# Patient Record
Sex: Female | Born: 1955 | Race: White | Hispanic: No | Marital: Single | Smoking: Current every day smoker
Health system: Southern US, Community
[De-identification: ages and names within clinical notes are randomized; demographics above are authoritative.]

## PROBLEM LIST (undated history)

## (undated) DIAGNOSIS — Z9889 Other specified postprocedural states: Secondary | ICD-10-CM

## (undated) DIAGNOSIS — E7800 Pure hypercholesterolemia, unspecified: Secondary | ICD-10-CM

## (undated) DIAGNOSIS — IMO0001 Reserved for inherently not codable concepts without codable children: Secondary | ICD-10-CM

## (undated) DIAGNOSIS — M1990 Unspecified osteoarthritis, unspecified site: Secondary | ICD-10-CM

## (undated) DIAGNOSIS — I10 Essential (primary) hypertension: Secondary | ICD-10-CM

## (undated) DIAGNOSIS — G5602 Carpal tunnel syndrome, left upper limb: Secondary | ICD-10-CM

## (undated) DIAGNOSIS — F32A Depression, unspecified: Secondary | ICD-10-CM

## (undated) DIAGNOSIS — Z8679 Personal history of other diseases of the circulatory system: Secondary | ICD-10-CM

## (undated) DIAGNOSIS — J449 Chronic obstructive pulmonary disease, unspecified: Secondary | ICD-10-CM

## (undated) DIAGNOSIS — G8929 Other chronic pain: Secondary | ICD-10-CM

## (undated) DIAGNOSIS — Z794 Long term (current) use of insulin: Secondary | ICD-10-CM

## (undated) DIAGNOSIS — E119 Type 2 diabetes mellitus without complications: Secondary | ICD-10-CM

## (undated) DIAGNOSIS — F329 Major depressive disorder, single episode, unspecified: Secondary | ICD-10-CM

## (undated) DIAGNOSIS — K219 Gastro-esophageal reflux disease without esophagitis: Secondary | ICD-10-CM

## (undated) HISTORY — PX: TUBAL LIGATION: SHX77

## (undated) HISTORY — PX: UMBILICAL HERNIA REPAIR: SHX196

---

## 2001-06-20 ENCOUNTER — Emergency Department (HOSPITAL_COMMUNITY): Admission: EM | Admit: 2001-06-20 | Discharge: 2001-06-20 | Payer: Self-pay | Admitting: *Deleted

## 2001-12-06 ENCOUNTER — Other Ambulatory Visit: Admission: RE | Admit: 2001-12-06 | Discharge: 2001-12-06 | Payer: Self-pay | Admitting: General Surgery

## 2007-05-14 ENCOUNTER — Encounter (INDEPENDENT_AMBULATORY_CARE_PROVIDER_SITE_OTHER): Payer: Self-pay | Admitting: General Surgery

## 2007-05-14 ENCOUNTER — Observation Stay (HOSPITAL_COMMUNITY): Admission: RE | Admit: 2007-05-14 | Discharge: 2007-05-15 | Payer: Self-pay | Admitting: General Surgery

## 2007-07-29 ENCOUNTER — Emergency Department (HOSPITAL_COMMUNITY): Admission: EM | Admit: 2007-07-29 | Discharge: 2007-07-29 | Payer: Self-pay | Admitting: Emergency Medicine

## 2007-08-01 ENCOUNTER — Emergency Department (HOSPITAL_COMMUNITY): Admission: EM | Admit: 2007-08-01 | Discharge: 2007-08-02 | Payer: Self-pay | Admitting: Emergency Medicine

## 2008-06-08 ENCOUNTER — Emergency Department (HOSPITAL_COMMUNITY): Admission: EM | Admit: 2008-06-08 | Discharge: 2008-06-08 | Payer: Self-pay | Admitting: Emergency Medicine

## 2009-02-16 ENCOUNTER — Emergency Department (HOSPITAL_COMMUNITY): Admission: EM | Admit: 2009-02-16 | Discharge: 2009-02-16 | Payer: Self-pay | Admitting: Emergency Medicine

## 2009-02-19 ENCOUNTER — Ambulatory Visit (HOSPITAL_COMMUNITY): Admission: RE | Admit: 2009-02-19 | Discharge: 2009-02-19 | Payer: Self-pay | Admitting: Family Medicine

## 2009-02-27 ENCOUNTER — Encounter: Admission: RE | Admit: 2009-02-27 | Discharge: 2009-02-27 | Payer: Self-pay | Admitting: Family Medicine

## 2009-03-11 ENCOUNTER — Emergency Department (HOSPITAL_COMMUNITY): Admission: EM | Admit: 2009-03-11 | Discharge: 2009-03-11 | Payer: Self-pay | Admitting: Emergency Medicine

## 2009-03-12 ENCOUNTER — Emergency Department (HOSPITAL_COMMUNITY): Admission: EM | Admit: 2009-03-12 | Discharge: 2009-03-12 | Payer: Self-pay | Admitting: Emergency Medicine

## 2010-11-05 NOTE — Op Note (Signed)
[name], [name]               ACCOUNT NO.:  [id number]   MEDICAL RECORD NO.:  [id number]          PATIENT TYPE:  AMB   LOCATION:  DAY                           FACILITY:  [facility]   PHYSICIAN:  [name], M.D. DATE OF BIRTH:  [birth year]   DATE OF PROCEDURE:  05/14/2007  DATE OF DISCHARGE:                               OPERATIVE REPORT   SURGEON:  Dr. Malvin Johns.   PREOPERATIVE DIAGNOSIS:  Umbilical hernia.   POSTOPERATIVE DIAGNOSIS:  Umbilical hernia.   PROCEDURE:  Umbilical herniorrhaphy (no use of mesh).   SPECIMEN:  Umbilical hernia sac with omental fat.   HISTORY OF PRESENT ILLNESS:  This is a 55 year old, obese, white female  who had periumbilical pain.  She was straining last week or so and  developed pain that was more or less of an acute nature of approximately  a week's duration.  She was sent to my office for evaluation.  In  essence, she had had an umbilical hernia and we had made plans for  repair.  We discussed complications not limited to but including  bleeding, infection and recurrence and the possible use of mesh.  Informed consent was obtained.   GROSS OPERATIVE FINDINGS:  The patient had omental fat herniated through  approximately a dime sized hernia defect.  There was no bowel or any  compromised intraperitoneal organs.   TECHNIQUE:  The patient was placed in supine position after the adequate  administration of general anesthesia. A periumbilical incision was  carried out over the inferior aspect of the umbilicus skin and  subcutaneous tissue was excised down to the fascia, the hernia sac was  dissected free from this skin of the umbilicus in order to avoid any  buttonholing of the skin.  The redundant portion of the hernia sac was  then entered, the omental fat was clamped and ligated with 2-0 silk and  sent along a specimen along with the hernia sac.  The defect was then  repaired with figure-of-eight #0 Prolene sutures.  After checking for  any  bleeding, the wound was then irrigated with normal saline solution.  I used approximately 20 mL of 1/2% Sensorcaine to help with  postoperative discomfort.  I sutured the portion of the umbilicus to the  fascia in order to restore the concave appearance of the umbilicus and  then closed the subcutaneous tissue with 3-0 Polysorb after irrigating  with normal saline solution.  The surgical staples were used to close  the skin. Sterile dressing with Neosporin ointment was applied. Prior to  closure, all sponge, needle and  instrument counts were found to be correct.  Estimated blood loss was  minimal.  The patient received approximately a liter of crystalloids  intraoperatively.  There were no complication.  She was taken to the  recovery room in satisfactory condition.      [name], M.D.  Electronically Signed     WB/MEDQ  D:  05/14/2007  T:  05/15/2007  Job:  [id number]   cc:   Angus G. Renard Matter, MD  Fax: [phone]

## 2011-03-28 LAB — GLUCOSE, CAPILLARY: Glucose-Capillary: 124 mg/dL — ABNORMAL HIGH (ref 70–99)

## 2011-04-01 LAB — BASIC METABOLIC PANEL
BUN: 10
CO2: 27
Calcium: 9
Chloride: 107
Creatinine, Ser: 0.66
GFR calc Af Amer: >60
GFR calc non Af Amer: >60
Glucose, Bld: 105 — ABNORMAL HIGH
Potassium: 4.6
Sodium: 140

## 2011-04-01 LAB — CBC
HCT: 38.9
Hemoglobin: 13.4
MCHC: 34.3
MCV: 86.5
Platelets: 288
RBC: 4.5
RDW: 13.4
WBC: 7.5

## 2011-04-01 LAB — DIFFERENTIAL
Basophils Absolute: 0
Basophils Relative: 1
Eosinophils Absolute: 0.3
Eosinophils Relative: 3
Lymphocytes Relative: 32
Lymphs Abs: 2.4
Monocytes Absolute: 0.5
Monocytes Relative: 6
Neutro Abs: 4.3
Neutrophils Relative %: 58

## 2011-04-16 ENCOUNTER — Other Ambulatory Visit: Payer: Self-pay

## 2011-04-16 ENCOUNTER — Encounter: Payer: Self-pay | Admitting: *Deleted

## 2011-04-16 ENCOUNTER — Inpatient Hospital Stay (HOSPITAL_COMMUNITY)
Admission: EM | Admit: 2011-04-16 | Discharge: 2011-04-18 | DRG: 143 | Disposition: A | Discharge status: Home / Self Care | Payer: BC Managed Care – PPO | Attending: Family Medicine | Admitting: Family Medicine

## 2011-04-16 ENCOUNTER — Emergency Department (HOSPITAL_COMMUNITY): Payer: BC Managed Care – PPO

## 2011-04-16 DIAGNOSIS — E785 Hyperlipidemia, unspecified: Secondary | ICD-10-CM | POA: Diagnosis present

## 2011-04-16 DIAGNOSIS — R3989 Other symptoms and signs involving the genitourinary system: Secondary | ICD-10-CM | POA: Diagnosis present

## 2011-04-16 DIAGNOSIS — N19 Unspecified kidney failure: Secondary | ICD-10-CM

## 2011-04-16 DIAGNOSIS — F17200 Nicotine dependence, unspecified, uncomplicated: Secondary | ICD-10-CM | POA: Diagnosis present

## 2011-04-16 DIAGNOSIS — I10 Essential (primary) hypertension: Secondary | ICD-10-CM | POA: Diagnosis present

## 2011-04-16 DIAGNOSIS — R079 Chest pain, unspecified: Secondary | ICD-10-CM

## 2011-04-16 DIAGNOSIS — R0789 Other chest pain: Principal | ICD-10-CM | POA: Diagnosis present

## 2011-04-16 DIAGNOSIS — E119 Type 2 diabetes mellitus without complications: Secondary | ICD-10-CM | POA: Diagnosis present

## 2011-04-16 LAB — COMPREHENSIVE METABOLIC PANEL
ALT: 77 U/L — ABNORMAL HIGH (ref 0–35)
AST: 71 U/L — ABNORMAL HIGH (ref 0–37)
Albumin: 4.1 g/dL (ref 3.5–5.2)
Alkaline Phosphatase: 83 U/L (ref 39–117)
BUN: 27 mg/dL — ABNORMAL HIGH (ref 6–23)
CO2: 22 mEq/L (ref 19–32)
Calcium: 9.7 mg/dL (ref 8.4–10.5)
Chloride: 93 mEq/L — ABNORMAL LOW (ref 96–112)
Creatinine, Ser: 1.91 mg/dL — ABNORMAL HIGH (ref 0.50–1.10)
GFR calc Af Amer: 33 mL/min — ABNORMAL LOW (ref >90)
GFR calc non Af Amer: 29 mL/min — ABNORMAL LOW (ref >90)
Glucose, Bld: 192 mg/dL — ABNORMAL HIGH (ref 70–99)
Potassium: 4.9 mEq/L (ref 3.5–5.1)
Sodium: 132 mEq/L — ABNORMAL LOW (ref 135–145)
Total Bilirubin: 0.2 mg/dL — ABNORMAL LOW (ref 0.3–1.2)
Total Protein: 8.5 g/dL — ABNORMAL HIGH (ref 6.0–8.3)

## 2011-04-16 LAB — CBC
HCT: 43.3 % (ref 36.0–46.0)
Hemoglobin: 14.6 g/dL (ref 12.0–15.0)
MCH: 29.6 pg (ref 26.0–34.0)
MCHC: 33.7 g/dL (ref 30.0–36.0)
MCV: 87.8 fL (ref 78.0–100.0)
Platelets: 302 10*3/uL (ref 150–400)
RBC: 4.93 MIL/uL (ref 3.87–5.11)
RDW: 13.1 % (ref 11.5–15.5)
WBC: 8.1 10*3/uL (ref 4.0–10.5)

## 2011-04-16 LAB — CARDIAC PANEL(CRET KIN+CKTOT+MB+TROPI)
CK, MB: 2.9 ng/mL (ref 0.3–4.0)
Relative Index: 2.5 (ref 0.0–2.5)
Total CK: 115 U/L (ref 7–177)
Troponin I: <0.3 ng/mL (ref <0.30)

## 2011-04-16 LAB — CK TOTAL AND CKMB (NOT AT ARMC)
CK, MB: 2.7 ng/mL (ref 0.3–4.0)
Relative Index: 2.2 (ref 0.0–2.5)
Total CK: 125 U/L (ref 7–177)

## 2011-04-16 LAB — DIFFERENTIAL
Basophils Absolute: 0 10*3/uL (ref 0.0–0.1)
Basophils Relative: 1 % (ref 0–1)
Eosinophils Absolute: 0.1 10*3/uL (ref 0.0–0.7)
Eosinophils Relative: 1 % (ref 0–5)
Lymphocytes Relative: 21 % (ref 12–46)
Lymphs Abs: 1.7 10*3/uL (ref 0.7–4.0)
Monocytes Absolute: 0.6 10*3/uL (ref 0.1–1.0)
Monocytes Relative: 7 % (ref 3–12)
Neutro Abs: 5.7 10*3/uL (ref 1.7–7.7)
Neutrophils Relative %: 70 % (ref 43–77)

## 2011-04-16 LAB — GLUCOSE, CAPILLARY
Glucose-Capillary: 119 mg/dL — ABNORMAL HIGH (ref 70–99)
Glucose-Capillary: 141 mg/dL — ABNORMAL HIGH (ref 70–99)
Glucose-Capillary: 111 mg/dL — ABNORMAL HIGH (ref 70–99)

## 2011-04-16 LAB — POCT I-STAT TROPONIN I: Troponin i, poc: 0.01 ng/mL (ref 0.00–0.08)

## 2011-04-16 MED ORDER — SODIUM CHLORIDE 0.9 % IV SOLN
INTRAVENOUS | Status: DC
  Administered 2011-04-17 (×2): via INTRAVENOUS

## 2011-04-16 MED ORDER — ASPIRIN 81 MG PO CHEW
324.0000 mg | CHEWABLE_TABLET | Freq: Once | ORAL | Status: AC
  Administered 2011-04-16: 324 mg via ORAL
  Filled 2011-04-16: qty 4

## 2011-04-16 MED ORDER — SODIUM CHLORIDE 0.9 % IJ SOLN
INTRAMUSCULAR | Status: AC
  Filled 2011-04-16: qty 10

## 2011-04-16 MED ORDER — NITROGLYCERIN 0.4 MG SL SUBL: 0.4000 mg | SUBLINGUAL_TABLET | SUBLINGUAL | Status: DC | PRN

## 2011-04-16 MED ORDER — ROSUVASTATIN CALCIUM 20 MG PO TABS
20.0000 mg | ORAL_TABLET | Freq: Every day | ORAL | Status: DC
  Administered 2011-04-16 – 2011-04-18 (×3): 20 mg via ORAL
  Filled 2011-04-16 (×3): qty 1

## 2011-04-16 MED ORDER — MORPHINE SULFATE 4 MG/ML IJ SOLN
4.0000 mg | Freq: Once | INTRAMUSCULAR | Status: AC
  Administered 2011-04-16: 4 mg via INTRAVENOUS
  Filled 2011-04-16: qty 1

## 2011-04-16 MED ORDER — HYDROCODONE-ACETAMINOPHEN 5-325 MG PO TABS
1.0000 | ORAL_TABLET | ORAL | Status: DC | PRN
  Administered 2011-04-16: 1 via ORAL
  Filled 2011-04-16: qty 1

## 2011-04-16 MED ORDER — ASPIRIN 325 MG PO TABS
325.0000 mg | ORAL_TABLET | Freq: Every day | ORAL | Status: DC
  Administered 2011-04-16 – 2011-04-18 (×3): 325 mg via ORAL
  Filled 2011-04-16 (×3): qty 1

## 2011-04-16 MED ORDER — KETOROLAC TROMETHAMINE 30 MG/ML IJ SOLN
30.0000 mg | Freq: Once | INTRAMUSCULAR | Status: AC
  Administered 2011-04-16: 30 mg via INTRAVENOUS
  Filled 2011-04-16: qty 1

## 2011-04-16 MED ORDER — METFORMIN HCL 500 MG PO TABS
500.0000 mg | ORAL_TABLET | Freq: Two times a day (BID) | ORAL | Status: DC
  Administered 2011-04-16 – 2011-04-18 (×3): 500 mg via ORAL
  Filled 2011-04-16 (×3): qty 1

## 2011-04-16 MED ORDER — GI COCKTAIL ~~LOC~~
30.0000 mL | Freq: Once | ORAL | Status: AC
  Administered 2011-04-16: 30 mL via ORAL
  Filled 2011-04-16: qty 30

## 2011-04-16 NOTE — Consult Note (Signed)
Clinical Summary [name] is a 55 y.o.female admitted by Dr. Renard Matter with recent onset chest pain. We are consulted to assist with her evaluation.  She reports a recent history flu-like symptoms, specifically nausea with intermittent emesis, diarrhea, generalized achiness, and weakness. She states that she was treated with antibiotics by Dr. Renard Matter and was out of work until last night. 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 She had not experienced the symptoms in the week preceding.  She reports no personal history of CAD or myocardial infarction. She has not undergone any prior ischemic testing. Past medical history is reviewed below.  No Known Allergies  Home Medications Prescriptions prior to admission  Medication Sig Dispense Refill  . ALPRAZolam (XANAX) 0.5 MG tablet Take 0.5 mg by mouth every 4 (four) hours as needed. FOR ANXIETY       . cefdinir (OMNICEF) 300 MG capsule Take 300 mg by mouth 2 (two) times daily. STARTED ON 04-10-11 FOR 8 DAYS       . HYDROcodone-acetaminophen (LORTAB) 7.5-500 MG per tablet Take 1 tablet by mouth every 4 (four) hours as needed. FOR PAIN        . metFORMIN (GLUCOPHAGE) 500 MG tablet Take 500 mg by mouth every morning.        . rosuvastatin (CRESTOR) 10 MG tablet Take 10 mg by mouth daily. SAMPLES         Past Medical History  Diagnosis Date  . Type 2 diabetes mellitus   . Essential hypertension, benign   . Mixed hyperlipidemia     Past Surgical History  Procedure Date  . Umbilical hernia repair 2008  . Bilateral tubal ligation     Family History  Problem Relation Age of Onset  . Cancer Father   . COPD Mother     Social History Ms. Portell reports that she has been smoking Cigarettes.  She has been smoking about .5 packs per day. She has never used smokeless  tobacco. Ms. Mayden reports that she does not drink alcohol.  Review of Systems No palpitations or syncope. No claudication. No cough or hemarthrosis. Reports poor appetite this week, decreased urine output. Otherwise review negative.  Physical Examination Temp:  [97.6 F (36.4 C)-98.3 F (36.8 C)] 98.3 F (36.8 C) (10/24 1438) Pulse Rate:  [54-84] 62  (10/24 1438) Resp:  [13-21] 18  (10/24 1438) BP: (96-116)/(51-63) 101/63 mmHg (10/24 1438) SpO2:  [2 %-99 %] 92 % (10/24 1438) Weight:  [200 lb (90.719 kg)] 200 lb (90.719 kg) (10/24 0308)  Obese woman in no acute distress. HEENT: Conjunctiva and lids normal, oropharynx with poor dentition. Neck: Supple, no obvious JVD or carotid bruits, no thyromegaly. Lungs: Clear to auscultation, coarse breath sounds, no rales. Cardiac: Regular rate and rhythm with somewhat distant heart sounds, no significant murmur or S3 gallop. Abdomen: Nontender, nondistended, bowel sounds present. Skin: Warm and dry, scattered tattoos. Extremities: No pitting edema, distal pulses 1-2+. Musculoskeletal: No kyphosis. Neuropsychiatric: Alert and oriented x3, affect appropriate.  Testing Lab Results  Component Value Date   WBC 8.1 04/16/2011   HGB 14.6 04/16/2011   HCT 43.3 04/16/2011   MCV 87.8 04/16/2011   PLT 302 04/16/2011    Lab Results  Component Value Date   CREATININE 1.91* 04/16/2011   BUN 27* 04/16/2011   NA 132* 04/16/2011   K 4.9 04/16/2011  CL 93* 04/16/2011   CO2 22 04/16/2011    Lab Results  Component Value Date   ALT 77* 04/16/2011   AST 71* 04/16/2011   ALKPHOS 83 04/16/2011   BILITOT 0.2* 04/16/2011    Lab Results  Component Value Date   CKTOTAL 115 04/16/2011   CKMB 2.9 04/16/2011   TROPONINI <0.30 04/16/2011    ECG ECG shows sinus rhythm with borderline LVH.  Imaging Chest x-ray reports no acute cardio pulmonary process.  Impression  1. Chest pain syndrome - features are somewhat concerning for new onset  angina, however ECG is nonspecific, and initial cardiac markers are normal. Primary cardiac risk factors include hypertension, diabetes mellitus, tobacco abuse, and hyperlipidemia.  2. Renal insufficiency, presumably acute, although baseline is not certain based on records. She reports a recent flulike illness with anorexia and decreased urine output, perhaps there is a component of dehydration.  Recommendations  Situation was discussed with the patient. In light of her new onset symptoms and risk factor profile with moderate pretest probability for potential CAD, plan will be to proceed with an exercise Myoview tomorrow, presuming cardiac markers remain normal and she is clinically stable. Otherwise evaluation as per primary team. She is receiving hydration with plan for followup BMET in the morning.

## 2011-04-16 NOTE — ED Notes (Signed)
TO by Dr Renard Matter

## 2011-04-16 NOTE — ED Provider Notes (Signed)
History     CSN: [id number] Arrival date & time: 04/16/2011  3:11 AM   None     Chief Complaint  Patient presents with  . Chest Pain    (Consider location/radiation/quality/duration/timing/severity/associated sxs/prior treatment) HPI Comments: Patient is a 55 year old female with a history of hypertension, diabetes, high cholesterol, smoking who presents with substernal chest pressure with radiation to the right shoulder. She states that she has been out of work all week because of the flu feeling body aches, shortness of breath and some difficulty breathing but this hasn't seemed to improve slowly. Tonight was her first day back at work and while at work and walking felt this pain come on her suddenly. She did have associated shortness of breath and diaphoresis but no nausea or vomiting.  Patient is a 55 y.o. female presenting with chest pain. The history is provided by the patient and a relative.  Chest Pain     Past Medical History  Diagnosis Date  . Diabetes mellitus   . Hypertension     History reviewed. No pertinent past surgical history.  No family history on file.  History  Substance Use Topics  . Smoking status: Current Everyday Smoker  . Smokeless tobacco: Not on file  . Alcohol Use: No    OB History    Grav Para Term Preterm Abortions TAB SAB Ect Mult Living                  Review of Systems  Cardiovascular: Positive for chest pain.  All other systems reviewed and are negative.    Allergies  Review of patient's allergies indicates no known allergies.  Home Medications   Current Outpatient Rx  Name Route Sig Dispense Refill  . METFORMIN HCL PO Oral Take by mouth.      . CRESTOR PO Oral Take by mouth.        BP 116/51  Pulse 84  Resp 20  Ht 5\' 4"  (1.626 m)  Wt 200 lb (90.719 kg)  BMI 34.33 kg/m2  SpO2 97%  Physical Exam  Nursing note and vitals reviewed. Constitutional: She appears well-developed and well-nourished. No distress.    HENT:  Head: Normocephalic and atraumatic.  Mouth/Throat: Oropharynx is clear and moist. No oropharyngeal exudate.  Eyes: Conjunctivae and EOM are normal. Pupils are equal, round, and reactive to light. Right eye exhibits no discharge. Left eye exhibits no discharge. No scleral icterus.  Neck: Normal range of motion. Neck supple. No JVD present. No thyromegaly present.  Cardiovascular: Normal rate, regular rhythm, normal heart sounds and intact distal pulses.  Exam reveals no gallop and no friction rub.   No murmur heard. Pulmonary/Chest: Effort normal and breath sounds normal. No respiratory distress. She has no wheezes. She has no rales.  Abdominal: Soft. Bowel sounds are normal. She exhibits no distension and no mass. There is no tenderness.  Musculoskeletal: Normal range of motion. She exhibits no edema and no tenderness.  Lymphadenopathy:    She has no cervical adenopathy.  Neurological: She is alert. Coordination normal.  Skin: Skin is warm and dry. No rash noted. No erythema.  Psychiatric: She has a normal mood and affect. Her behavior is normal.    ED Course  Procedures (including critical care time)   Labs Reviewed  POCT I-STAT TROPONIN I  CBC  DIFFERENTIAL  COMPREHENSIVE METABOLIC PANEL  I-STAT TROPONIN I   No results found.   No diagnosis found.    MDM  Exam is normal, blood pressure  116/51 pulse of 84 and an EKG which is nonischemic. We'll obtain a chest x-ray to rule out pneumonia, lab work to rule out acute coronary syndrome. Aspirin and Toradol given for patient's muscle aches and potential ACS.      ED ECG REPORT   Date: 04/16/2011 0302  Rate: 87  Rhythm: normal sinus rhythm  QRS Axis: left  Intervals: normal  ST/T Wave abnormalities: normal  Conduction Disutrbances:none  Narrative Interpretation: LVH  Old EKG Reviewed: none available   Results for orders placed during the hospital encounter of 04/16/11  CBC      Component Value Range   WBC  8.1  4.0 - 10.5 (K/uL)   RBC 4.93  3.87 - 5.11 (MIL/uL)   Hemoglobin 14.6  12.0 - 15.0 (g/dL)   HCT 16.1  09.6 - 04.5 (%)   MCV 87.8  78.0 - 100.0 (fL)   MCH 29.6  26.0 - 34.0 (pg)   MCHC 33.7  30.0 - 36.0 (g/dL)   RDW 40.9  81.1 - 91.4 (%)   Platelets 302  150 - 400 (K/uL)  DIFFERENTIAL      Component Value Range   Neutrophils Relative 70  43 - 77 (%)   Neutro Abs 5.7  1.7 - 7.7 (K/uL)   Lymphocytes Relative 21  12 - 46 (%)   Lymphs Abs 1.7  0.7 - 4.0 (K/uL)   Monocytes Relative 7  3 - 12 (%)   Monocytes Absolute 0.6  0.1 - 1.0 (K/uL)   Eosinophils Relative 1  0 - 5 (%)   Eosinophils Absolute 0.1  0.0 - 0.7 (K/uL)   Basophils Relative 1  0 - 1 (%)   Basophils Absolute 0.0  0.0 - 0.1 (K/uL)  COMPREHENSIVE METABOLIC PANEL      Component Value Range   Sodium 132 (*) 135 - 145 (mEq/L)   Potassium 4.9  3.5 - 5.1 (mEq/L)   Chloride 93 (*) 96 - 112 (mEq/L)   CO2 22  19 - 32 (mEq/L)   Glucose, Bld 192 (*) 70 - 99 (mg/dL)   BUN 27 (*) 6 - 23 (mg/dL)   Creatinine, Ser 7.82 (*) 0.50 - 1.10 (mg/dL)   Calcium 9.7  8.4 - 95.6 (mg/dL)   Total Protein 8.5 (*) 6.0 - 8.3 (g/dL)   Albumin 4.1  3.5 - 5.2 (g/dL)   AST 71 (*) 0 - 37 (U/L)   ALT 77 (*) 0 - 35 (U/L)   Alkaline Phosphatase 83  39 - 117 (U/L)   Total Bilirubin 0.2 (*) 0.3 - 1.2 (mg/dL)   GFR calc non Af Amer 29 (*) >90 (mL/min)   GFR calc Af Amer 33 (*) >90 (mL/min)  POCT I-STAT TROPONIN I      Component Value Range   Troponin i, poc 0.01  0.00 - 0.08 (ng/mL)   Comment 3            Dg Chest 2 View  04/16/2011  *RADIOLOGY REPORT*  Clinical Data: Chest pain, cough, fever and right arm pain.  Body aches.  CHEST - 2 VIEW  Comparison: Chest radiograph performed 06/08/2008  Findings: The lungs are well-aerated and clear.  There is no evidence of focal opacification, pleural effusion or pneumothorax.  The heart is borderline normal in size; the mediastinal contour is within normal limits.  No acute osseous abnormalities are seen.   IMPRESSION: No acute cardiopulmonary process seen.  Original Report Authenticated By: Tonia Ghent, M.D.    i have discussed care  with the on-call internist who has agreed to admit the patient to the hospital for renal failure and dehydration and chest pain.  Vida Roller, MD 04/16/11 [id number]

## 2011-04-16 NOTE — ED Notes (Signed)
Pt c/o severe heartburn at this time.  Informed Dr. Hyacinth Meeker.  Order received for GI cocktail.

## 2011-04-16 NOTE — ED Notes (Signed)
Pt reports sudden onset of rt sided chest pain radiating to rt side of neck and jaw, incident occurred while at work [employer]

## 2011-04-17 ENCOUNTER — Encounter (HOSPITAL_COMMUNITY): Payer: Self-pay

## 2011-04-17 ENCOUNTER — Inpatient Hospital Stay (HOSPITAL_COMMUNITY): Payer: BC Managed Care – PPO

## 2011-04-17 ENCOUNTER — Encounter (HOSPITAL_COMMUNITY): Payer: Self-pay | Admitting: Cardiology

## 2011-04-17 DIAGNOSIS — R079 Chest pain, unspecified: Secondary | ICD-10-CM

## 2011-04-17 LAB — BASIC METABOLIC PANEL
BUN: 33 mg/dL — ABNORMAL HIGH (ref 6–23)
CO2: 20 mEq/L (ref 19–32)
Calcium: 8.8 mg/dL (ref 8.4–10.5)
Chloride: 103 mEq/L (ref 96–112)
Creatinine, Ser: 1.32 mg/dL — ABNORMAL HIGH (ref 0.50–1.10)
GFR calc Af Amer: 52 mL/min — ABNORMAL LOW (ref >90)
GFR calc non Af Amer: 45 mL/min — ABNORMAL LOW (ref >90)
Glucose, Bld: 139 mg/dL — ABNORMAL HIGH (ref 70–99)
Potassium: 5 mEq/L (ref 3.5–5.1)
Sodium: 133 mEq/L — ABNORMAL LOW (ref 135–145)

## 2011-04-17 LAB — GLUCOSE, CAPILLARY
Glucose-Capillary: 148 mg/dL — ABNORMAL HIGH (ref 70–99)
Glucose-Capillary: 119 mg/dL — ABNORMAL HIGH (ref 70–99)
Glucose-Capillary: 106 mg/dL — ABNORMAL HIGH (ref 70–99)
Glucose-Capillary: 137 mg/dL — ABNORMAL HIGH (ref 70–99)
Glucose-Capillary: 139 mg/dL — ABNORMAL HIGH (ref 70–99)

## 2011-04-17 LAB — CARDIAC PANEL(CRET KIN+CKTOT+MB+TROPI)
CK, MB: 2.9 ng/mL (ref 0.3–4.0)
CK, MB: 3.1 ng/mL (ref 0.3–4.0)
Relative Index: 2.7 — ABNORMAL HIGH (ref 0.0–2.5)
Relative Index: 2.8 — ABNORMAL HIGH (ref 0.0–2.5)
Total CK: 108 U/L (ref 7–177)
Total CK: 109 U/L (ref 7–177)
Troponin I: <0.3 ng/mL (ref <0.30)
Troponin I: <0.3 ng/mL (ref <0.30)

## 2011-04-17 MED ORDER — SODIUM CHLORIDE 0.9 % IJ SOLN
INTRAMUSCULAR | Status: AC
  Administered 2011-04-17: 10 mL via INTRAVENOUS
  Filled 2011-04-17: qty 10

## 2011-04-17 MED ORDER — TECHNETIUM TC 99M TETROFOSMIN IV KIT
10.0000 | PACK | Freq: Once | INTRAVENOUS | Status: AC | PRN
  Administered 2011-04-17: 9.5 via INTRAVENOUS

## 2011-04-17 MED ORDER — TECHNETIUM TC 99M TETROFOSMIN IV KIT
30.0000 | PACK | Freq: Once | INTRAVENOUS | Status: AC | PRN
  Administered 2011-04-17: 30 via INTRAVENOUS

## 2011-04-17 MED ORDER — REGADENOSON 0.4 MG/5ML IV SOLN
INTRAVENOUS | Status: AC
  Filled 2011-04-17: qty 5

## 2011-04-17 MED ORDER — SODIUM CHLORIDE 0.9 % IJ SOLN
INTRAMUSCULAR | Status: AC
  Administered 2011-04-17: 15:00:00
  Filled 2011-04-17: qty 3

## 2011-04-17 NOTE — Discharge Summary (Signed)
[name], [name]               ACCOUNT NO.:  [id number]  MEDICAL RECORD NO.:  [id number]  LOCATION:  A214                          FACILITY:  [facility]  PHYSICIAN:  Phoenix Riesen G. Renard Matter, MD   DATE OF BIRTH:  1956-03-27  DATE OF ADMISSION:  04/16/2011 DATE OF DISCHARGE:  10/26/2012LH                              DISCHARGE SUMMARY   DIAGNOSES: 1. Atypical chest pain. 2. Non-insulin dependent diabetes. 3. Hypertension. 4. Dyslipidemia. 5. Prerenal azotemia, flu-like illness.  CONDITION:  Stable and improved at the time of discharge.  This 55 year old female developed substernal chest pain with radiation to  right shoulder and arm and diaphoresis prior to admission. Apparently, the pain came on suddenly while she was at work, did have associated shortness of breath.  She was seen by ED physician.  Does have a history of hypertension, diabetes, hyperlipidemia, is a cigarette smoker.  X-ray of chest did not show acute cardiopulmonary process. Electrocardiogram done showed LVH, but no acute changes, otherwise. Cardiac markers showed a troponin of 0.01.  It was felt that the patient should be admitted to rule out coronary syndrome.  The patient was diaphoretic, had decreased urinary output as well at the time of her admission, she was admitted to telemetry bed.  PHYSICAL EXAMINATION:  VITAL SIGNS:  On admission, vital signs blood pressure 116/51, pulse 84, respirations 20, temp 98. HEENT:  Eyes PERRLA.  TMs negative.  Oropharynx benign. NECK:  Supple.  No JVD or thyroid abnormalities. HEART:  Regular rhythm.  No murmurs.  No cardiomegaly. LUNGS:  Clear to P and A.  No rales, rhonchi, or wheezes noted. ABDOMEN:  No palpable organs or masses.  No organomegaly. EXTREMITIES:  Free of edema. NEUROLOGICAL:  The patient is alert.  No sensory motor abnormalities.  LABORATORY DATA:  Admission CBC, WBC 8100 with hemoglobin 14.6, hematocrit 43.3.  Differential normal.  Stat troponin on admission  was 0.01.  CK on admission 125, CK-MB 2.7, relative index 2.2.  Subsequent cardiac panel on April 17, 2011, CK 109, CK-MB 3.1, troponin less than 0.30, relative index 2.8.  Glucose in the hospital ranged from 111-148. Chemistries, sodium 132, potassium 4.9, chloride 93, CO2 of 22, glucose 192, BUN 27, creatinine 1.91, calcium 9.7.  Subsequent chemistries, sodium 133, potassium 5.0, chloride 103, CO2 of 20, glucose of 139, BUN 33, creatinine 1.32, calcium 8.8.  X-rays, chest x-ray on admission, no acute cardiopulmonary process seen.  HOSPITAL COURSE:  The patient at the time of admission was placed on 2000 calorie diabetic diet.  She was continued on aspirin 325 mg daily, metformin 500 mg b.i.d., Crestor 20 mg daily, normal saline 100 mL/hour was started, patient had p.r.n. orders for hydrocodone/acetaminophen 5/325 every 4 hours p.r.n., sublingual nitroglycerin 0.4 mg p.r.n.  The patient shortly after admission became relatively symptom free.  Her cardiac markers remained normal.  EKG showed no acute changes.  She was seen in consultation by Cardiology.  Dr. Diona Browner and subsequently Dr. Dietrich Pates, Dr. Diona Browner suggested that they proceed with an exercise Myoview.  It was felt she was clinically stable.  We continued the hydration.  She did have a slight elevation of creatinine on admission, but this normalized  subsequently.  Her stress nuclear study was performed.  The patient achieved reasonable workload with no chest discomfort.  Had negative stress EKGs.  There was no evidence of ischemia.  It was felt patient could be discharged and would not require Cardiology followup as an outpatient.  Patient's blood sugars remained relatively stable.  She was discharged on the following medications, metformin 500 mg b.i.d., Crestor 20 mg daily, aspirin 325 mg daily.  The patient was stable at the time of discharge.     Derron Pipkins G. Renard Matter, MD     AGM/MEDQ  D:  04/17/2011  T:  04/17/2011   Job:  [id number]

## 2011-04-17 NOTE — H&P (Signed)
[name], [name]               ACCOUNT NO.:  [id number]  MEDICAL RECORD NO.:  [id number]  LOCATION:  A214                          FACILITY:  [facility]  PHYSICIAN:  Gaetano Romberger G. Renard Matter, MD   DATE OF BIRTH:  [birth year]  DATE OF ADMISSION:  04/16/2011 DATE OF DISCHARGE:  LH                             HISTORY & PHYSICAL   A 55 year old female developed substernal chest pain with radiation to right shoulder and arm and diaphoresis.  Apparently the pain came on suddenly while at work.  She does have associated shortness of breath. She was seen and evaluated by ED physician.  Does have a history of hypertension, diabetes, hyperlipidemia, and is a cigarette smoker.  X- ray of chest did not show acute cardiopulmonary process. Electrocardiogram done showed LVH, but no acute changes otherwise. Cardiac markers showed a troponin 0.01.  It was felt that she should be admitted to rule out acute coronary syndrome.  Apparently, the patient was diaphoretic and had decreased urinary output as well.  The patient was admitted to telemetry bed.  SOCIAL HISTORY:  The patient continues to smoke daily.  Does not use alcohol or drugs.  PAST MEDICAL HISTORY:  The patient has prior history of diabetes mellitus type 2 and hypertension.  ALLERGIES:  The patient has no known allergies.  HOME MEDICATION LIST:  Metformin and Crestor.  REVIEW OF SYSTEMS:  HEENT:  Negative.  CARDIOPULMONARY:  The patient has had some dyspnea and substernal chest pain and pain radiating to right shoulder.  No cough or hemoptysis.  GI:  No nausea, vomiting, or diarrhea.  GU:  Decreased urinary output, but no dysuria.  PHYSICAL EXAMINATION:  VITAL SIGNS:  Blood pressure 116/51, pulse 84, respirations 20, temp 98. HEENT:  Eyes PERRLA.  TMs negative.  Oropharynx benign. NECK:  Supple.  No JVD or thyroid abnormalities. HEART:  Regular rhythm.  No murmurs.  No cardiomegaly. LUNGS:  Clear to P and A.  No rales, rhonchi, or  wheezes. ABDOMEN:  No palpable organs.  No masses or organomegaly. EXTREMITIES:  Free of edema. NEUROLOGICAL:  The patient is alert, has no sensory motor abnormalities in extremities.  ASSESSMENT:  The patient was admitted with substernal chest pain to rule out coronary syndrome.  She does have a history of diabetes and hypertension.  PLAN:  To continue to monitor cardiac markers.  We will obtain Cardiology consult.  Continue current medicines.  The patient will be placed on diabetic diet, metformin 500 mg b.i.d.  We will obtain a cardiac consult today.  Continue to monitor cardiac markers.     [name] G. Renard Matter, MD     AGM/MEDQ  D:  04/16/2011  T:  04/16/2011  Job:  [id number]

## 2011-04-17 NOTE — Progress Notes (Signed)
Subjective: Patient denies any further chest discomfort. She has had no dyspnea. She has not had difficulty refraining from cigarette smoking in hospital.  Objective: Vital signs in last 24 hours: Temp:  [97.4 F (36.3 C)-98 F (36.7 C)] 98 F (36.7 C) (10/25 1415) Pulse Rate:  [60-64] 62  (10/25 1415) Resp:  [18-19] 18  (10/25 1415) BP: (94-103)/(59-61) 100/61 mmHg (10/25 1415) SpO2:  [99 %-100 %] 100 % (10/25 1415) Weight change:  Last BM Date: 04/15/11  Intake/Output from previous day: 10/24 0701 - 10/25 0700 In: 1200 [I.V.:1200] Out: 50 [Urine:50]  General-Well developed; no acute distress Body habitus-obese Neck-No JVD, no carotid bruits Lungs: Clear lung fields; normal I:E ratio Cardiovascular-normal PMI; normal S1 and S2; modest systolic murmur Abdomen-normal bowel sounds; soft and non-tender without masses or organomegaly Skin-Warm, no significant lesions Extremities-Nl distal pulses; no edema  Lab Results:  [facility] 04/16/11 0319  WBC 8.1  HGB 14.6  HCT 43.3  PLT 302   BMET  Basename 04/17/11 0530 04/16/11 0319  NA 133* 132*  K 5.0 4.9  CL 103 93*  CO2 20 22  GLUCOSE 139* 192*  BUN 33* 27*  CREATININE 1.32* 1.91*  CALCIUM 8.8 9.7    Studies/Results: Dg Chest 2 View  04/16/2011  *RADIOLOGY REPORT*  Clinical Data: Chest pain, cough, fever and right arm pain.  Body aches.  CHEST - 2 VIEW  Comparison: Chest radiograph performed 06/08/2008  Findings: The lungs are well-aerated and clear.  There is no evidence of focal opacification, pleural effusion or pneumothorax.  The heart is borderline normal in size; the mediastinal contour is within normal limits.  No acute osseous abnormalities are seen.  IMPRESSION: No acute cardiopulmonary process seen.  Original Report Authenticated By: Tonia Ghent, M.D.    Medications: Reviewed.  Assessment/Plan: Stress nuclear study performed. Patient achieved a reasonable workload with no chest discomfort. She stopped  with dyspnea and fatigue and was negative stress EKGs. Scintigraphic imaging is pending. With no evidence for ischemia, patient can be discharged and will not require cardiology outpatient followup.  LOS: 1 day   Superior Bing 04/17/2011, 5:11 PM

## 2011-04-17 NOTE — Progress Notes (Signed)
Dr. Renard Matter notified of patient's EKG results.  He stated he would look at EKG when he comes to floor.

## 2011-04-17 NOTE — Progress Notes (Signed)
[name], [name]               ACCOUNT NO.:  [id number]  MEDICAL RECORD NO.:  [id number]  LOCATION:  A214                          FACILITY:  [facility]  PHYSICIAN:  Devontre Siedschlag G. Joniya Boberg, MD   DATE OF BIRTH:  1956/04/05  DATE OF PROCEDURE: DATE OF DISCHARGE:                                PROGRESS NOTE   This patient was admitted to the hospital with acute onset of mid chest pain involving the right side of chest and pain radiating into the right shoulder associated with diaphoresis.  Her cardiac markers remained relatively normal with most recent total CK 109, CPK-MB 3.1, troponin less than 0.30, relative index 2.8.  Her chemistries were essentially normal with exception of slightly low serum sodium of 132.  Creatinine 1.91, BUN 27.  Glucose 192.  The patient was seen by Cardiology yesterday and is to have stress test done today.  She is feeling some better.  OBJECTIVE:  VITAL SIGNS:  Blood pressure 94/59, respirations 18, pulse 60, temperature 97.4. HEART:  Regular rhythm. LUNGS:  Clear to P and A. ABDOMEN:  No palpable organs or masses.  ASSESSMENT:  The patient admitted with acute onset of chest pain associated with diaphoresis.  She was admitted to rule out coronary syndrome.  She does have history of excessive cigarette smoking and diabetes.  PLAN:  To proceed with stress test today.  We will repeat BMET.     Alessio Bogan G. Renard Matter, MD     AGM/MEDQ  D:  04/17/2011  T:  04/17/2011  Job:  [id number]

## 2011-04-17 NOTE — Progress Notes (Signed)
Stress Lab Nurses Notes - [name]  [name] 04/17/2011  Reason for doing test: Chest Pain  Type of test: Stress Myoview  Nurse performing test: Parke Poisson, RN  Nuclear Medicine Tech: Lyndel Pleasure  Echo Tech: Not Applicable  MD performing test: R. Dietrich Pates  Family MD: McInnis  Test explained and consent signed: yes  IV started: 20g jelco, Saline lock flushed, IV in progress from floor, No redness or edema and Saline lock from floor  Symptoms: Fatigue and SOB  Treatment/Intervention: None  Reason test stopped: fatigue, reached target HR and SOB  After recovery IV was: No redness or edema and Saline Lock flushed  Patient to return to Nuc. Med at : 13:20pm  Patient discharged: Transported back to room 214 via WC  Patient's Condition upon discharge was: stable  Comments: During test peak BP 162/78 & HR 127.  Recovery BP 132/72 & HR 70.  Symptoms resolved in recovery.   [name] T

## 2011-04-18 LAB — BASIC METABOLIC PANEL
BUN: 18 mg/dL (ref 6–23)
CO2: 23 mEq/L (ref 19–32)
Calcium: 9.2 mg/dL (ref 8.4–10.5)
Chloride: 107 mEq/L (ref 96–112)
Creatinine, Ser: 0.89 mg/dL (ref 0.50–1.10)
GFR calc Af Amer: 84 mL/min — ABNORMAL LOW (ref >90)
GFR calc non Af Amer: 72 mL/min — ABNORMAL LOW (ref >90)
Glucose, Bld: 144 mg/dL — ABNORMAL HIGH (ref 70–99)
Potassium: 5.7 mEq/L — ABNORMAL HIGH (ref 3.5–5.1)
Sodium: 138 mEq/L (ref 135–145)

## 2011-04-18 LAB — GLUCOSE, CAPILLARY: Glucose-Capillary: 165 mg/dL — ABNORMAL HIGH (ref 70–99)

## 2011-04-18 NOTE — Progress Notes (Signed)
Pt given D/C paperwork and discussed home medications, pt verbalizes understanding.  States when to call 911 or return to Emergency Room.  Pt is A&O and stable at this time, no complaints of pain or discomfort.  Pt states she will call and make her own f/u appt with Dr. Renard Matter for the beginning of next week.  Pt waiting on her son to come pick her up.

## 2011-04-18 NOTE — Progress Notes (Signed)
Discharge instructions and education complete. Patient verbalized understanding. Patient discharged home with family.

## 2011-12-07 ENCOUNTER — Emergency Department (HOSPITAL_COMMUNITY): Payer: BC Managed Care – PPO

## 2011-12-07 ENCOUNTER — Emergency Department (HOSPITAL_COMMUNITY)
Admission: EM | Admit: 2011-12-07 | Discharge: 2011-12-07 | Disposition: A | Discharge status: Home / Self Care | Payer: BC Managed Care – PPO | Attending: Emergency Medicine | Admitting: Emergency Medicine

## 2011-12-07 ENCOUNTER — Encounter (HOSPITAL_COMMUNITY): Payer: Self-pay | Admitting: *Deleted

## 2011-12-07 DIAGNOSIS — E782 Mixed hyperlipidemia: Secondary | ICD-10-CM | POA: Insufficient documentation

## 2011-12-07 DIAGNOSIS — E119 Type 2 diabetes mellitus without complications: Secondary | ICD-10-CM | POA: Insufficient documentation

## 2011-12-07 DIAGNOSIS — I10 Essential (primary) hypertension: Secondary | ICD-10-CM | POA: Insufficient documentation

## 2011-12-07 DIAGNOSIS — F17200 Nicotine dependence, unspecified, uncomplicated: Secondary | ICD-10-CM | POA: Insufficient documentation

## 2011-12-07 DIAGNOSIS — J189 Pneumonia, unspecified organism: Secondary | ICD-10-CM

## 2011-12-07 LAB — COMPREHENSIVE METABOLIC PANEL
ALT: 61 U/L — ABNORMAL HIGH (ref 0–35)
AST: 40 U/L — ABNORMAL HIGH (ref 0–37)
Albumin: 3.7 g/dL (ref 3.5–5.2)
Alkaline Phosphatase: 76 U/L (ref 39–117)
BUN: 13 mg/dL (ref 6–23)
CO2: 24 mEq/L (ref 19–32)
Calcium: 9.5 mg/dL (ref 8.4–10.5)
Chloride: 100 mEq/L (ref 96–112)
Creatinine, Ser: 0.73 mg/dL (ref 0.50–1.10)
GFR calc Af Amer: >90 mL/min (ref >90)
GFR calc non Af Amer: >90 mL/min (ref >90)
Glucose, Bld: 211 mg/dL — ABNORMAL HIGH (ref 70–99)
Potassium: 3.7 mEq/L (ref 3.5–5.1)
Sodium: 136 mEq/L (ref 135–145)
Total Bilirubin: 0.3 mg/dL (ref 0.3–1.2)
Total Protein: 7.4 g/dL (ref 6.0–8.3)

## 2011-12-07 LAB — PROTIME-INR
INR: 1.01 (ref 0.00–1.49)
Prothrombin Time: 13.5 seconds (ref 11.6–15.2)

## 2011-12-07 LAB — CBC
HCT: 41 % (ref 36.0–46.0)
Hemoglobin: 13.9 g/dL (ref 12.0–15.0)
MCH: 29.7 pg (ref 26.0–34.0)
MCHC: 33.9 g/dL (ref 30.0–36.0)
MCV: 87.6 fL (ref 78.0–100.0)
Platelets: 194 10*3/uL (ref 150–400)
RBC: 4.68 MIL/uL (ref 3.87–5.11)
RDW: 13.6 % (ref 11.5–15.5)
WBC: 5.9 10*3/uL (ref 4.0–10.5)

## 2011-12-07 LAB — TROPONIN I: Troponin I: <0.3 ng/mL (ref <0.30)

## 2011-12-07 MED ORDER — AZITHROMYCIN 250 MG PO TABS: 500.0000 mg | ORAL_TABLET | Freq: Every day | ORAL | Status: AC

## 2011-12-07 MED ORDER — DEXTROSE 5 % IV SOLN
500.0000 mg | Freq: Once | INTRAVENOUS | Status: AC
  Administered 2011-12-07: 500 mg via INTRAVENOUS
  Filled 2011-12-07: qty 500

## 2011-12-07 MED ORDER — NITROGLYCERIN 0.4 MG SL SUBL
0.4000 mg | SUBLINGUAL_TABLET | SUBLINGUAL | Status: DC | PRN
  Administered 2011-12-07: 0.4 mg via SUBLINGUAL
  Filled 2011-12-07: qty 25

## 2011-12-07 NOTE — ED Provider Notes (Signed)
History     CSN: [id number]  Arrival date & time 12/07/11  1813   First MD Initiated Contact with Patient 12/07/11 1827      No chief complaint on file.   (Consider location/radiation/quality/duration/timing/severity/associated sxs/prior treatment) HPI The patient presents with concerns of left-sided chest pain.  She notes that the pain began approximately one hour prior to presentation.  She was sitting at the time.  Since onset has been intermittent episodes of pain.  The pain is focally about the left upper chest with no radiation.  Pain is described as sharp.  He denies any concurrent nausea, shortness of breath, confusion.  She notes mild lightheadedness, but this seems to have been present prior to the onset of the chest pain.  She denies any cardiac history.  She states that she smokes 1.5 packs of cigarettes daily.  No clear alleviating or exacerbating factors, and the patient notes that each episode begins and resolves spontaneously Past Medical History  Diagnosis Date  . Type 2 diabetes mellitus   . Essential hypertension, benign   . Mixed hyperlipidemia   . Diabetes mellitus     Past Surgical History  Procedure Date  . Umbilical hernia repair 2008  . Bilateral tubal ligation     Family History  Problem Relation Age of Onset  . Cancer Father   . COPD Mother     History  Substance Use Topics  . Smoking status: Current Everyday Smoker -- 1.0 packs/day    Types: Cigarettes  . Smokeless tobacco: Never Used  . Alcohol Use: No    OB History    Grav Para Term Preterm Abortions TAB SAB Ect Mult Living                  Review of Systems  Constitutional:       Per HPI, otherwise negative  HENT:       Per HPI, otherwise negative  Eyes: Negative.   Respiratory:       Per HPI, otherwise negative  Cardiovascular:       Per HPI, otherwise negative  Gastrointestinal: Negative for vomiting.  Genitourinary: Negative.   Musculoskeletal:       Per HPI, otherwise  negative  Skin: Negative.   Neurological: Negative for syncope.    Allergies  Review of patient's allergies indicates no known allergies.  Home Medications   Current Outpatient Rx  Name Route Sig Dispense Refill  . ALPRAZOLAM 0.5 MG PO TABS Oral Take 0.5 mg by mouth every 4 (four) hours as needed. FOR ANXIETY     . HYDROCODONE-ACETAMINOPHEN 7.5-500 MG PO TABS Oral Take 1 tablet by mouth every 4 (four) hours as needed. FOR PAIN      . METFORMIN HCL 500 MG PO TABS Oral Take 500 mg by mouth every morning.      Marland Kitchen ROSUVASTATIN CALCIUM 10 MG PO TABS Oral Take 10 mg by mouth daily. SAMPLES     . UNKNOWN TO PATIENT Oral Take 1 tablet by mouth daily. For stress      BP 144/74  Pulse 86  Temp 98.3 F (36.8 C) (Oral)  Resp 16  Ht 5\' 4"  (1.626 m)  Wt 200 lb (90.719 kg)  BMI 34.33 kg/m2  SpO2 94%  Physical Exam  Nursing note and vitals reviewed. Constitutional: She is oriented to person, place, and time. She appears well-developed and well-nourished. No distress.  HENT:  Head: Normocephalic and atraumatic.  Eyes: Conjunctivae and EOM are normal.  Cardiovascular: Normal  rate and regular rhythm.   Pulmonary/Chest: Effort normal and breath sounds normal. No stridor. No respiratory distress.  Abdominal: She exhibits no distension.  Musculoskeletal: She exhibits no edema.  Neurological: She is alert and oriented to person, place, and time. No cranial nerve deficit.  Skin: Skin is warm and dry.  Psychiatric: She has a normal mood and affect.    ED Course  Procedures (including critical care time)   Labs Reviewed  COMPREHENSIVE METABOLIC PANEL  TROPONIN I  CBC  PROTIME-INR   No results found.   No diagnosis found.  Cardiac 85 sinus rhythm normal Pulse oximetry 90% room air normal   Date: 12/07/2011  Rate: 91  Rhythm: normal sinus rhythm  QRS Axis: normal  Intervals: normal  ST/T Wave abnormalities: nonspecific ST/T changes  Conduction Disutrbances:none  Narrative  Interpretation:   Old EKG Reviewed: changes noted Minimal changes from 2012 - borderline ecg  XR reviewed by me and demonstrated to the patient.   MDM   This 56 year old female with a long smoking history presents with new chest pain.  On exam she is in no distress, has no hypoxia, but appears uncomfortable.  Given the patient's description of chest pain, or smoking history there is some suspicion for cardiac disease, though her ECG is nonischemic, essentially unchanged.  The patient's evaluation is most notable for demonstration of a left sided pneumonia.  I discussed this, demonstrated the x-ray to the patient, and had a prolonged discussion on the need for smoking cessation as well as continued management with her outpatient physician.  She received her first dose of antibiotics in the emergency department and was discharged in stable condition.      Gerhard Munch, MD 12/07/11 [phone]

## 2011-12-07 NOTE — ED Notes (Signed)
Pt reporting sharp pain in left chest beginning about 1 hour ago.  Reports was sitting at time.  Denies any nausea or SOB associated with pain.  Denies pain radiating to any location.  No distress noted.

## 2011-12-07 NOTE — ED Notes (Signed)
Patient is stating that her IV is hurting really bad. RN Janette aware.

## 2011-12-07 NOTE — ED Notes (Signed)
Pt reports improvement after 1 SL nitro.

## 2011-12-07 NOTE — Discharge Instructions (Signed)
It is very important that you continue to have your condition managed by your physician.  Please call tomorrow to arrange appropriate ongoing care.  If you develop any new, or concerning changes in your condition, please return to the ED immediately.  You also need to attempt to stop smoking.  It is the single best intervention you can make to remain generally healthy.

## 2011-12-07 NOTE — ED Notes (Signed)
Pt demanding to have IV site changed.  Site flushed and non swollen, but pt reports site tender.  IV removed and new site in left hand for antibiotics

## 2011-12-07 NOTE — ED Notes (Signed)
Family at bedside. 

## 2011-12-07 NOTE — ED Notes (Signed)
Patient was not in the room at this time

## 2011-12-10 ENCOUNTER — Other Ambulatory Visit (HOSPITAL_COMMUNITY): Payer: Self-pay | Admitting: Family Medicine

## 2011-12-10 ENCOUNTER — Ambulatory Visit (HOSPITAL_COMMUNITY)
Admission: RE | Admit: 2011-12-10 | Discharge: 2011-12-10 | Disposition: A | Discharge status: Home / Self Care | Payer: BC Managed Care – PPO | Source: Ambulatory Visit | Attending: Family Medicine | Admitting: Family Medicine

## 2011-12-10 DIAGNOSIS — J189 Pneumonia, unspecified organism: Secondary | ICD-10-CM

## 2011-12-10 DIAGNOSIS — Z09 Encounter for follow-up examination after completed treatment for conditions other than malignant neoplasm: Secondary | ICD-10-CM | POA: Insufficient documentation

## 2012-12-04 ENCOUNTER — Encounter (HOSPITAL_COMMUNITY): Payer: Self-pay | Admitting: Emergency Medicine

## 2012-12-04 DIAGNOSIS — B3731 Acute candidiasis of vulva and vagina: Secondary | ICD-10-CM | POA: Insufficient documentation

## 2012-12-04 DIAGNOSIS — I10 Essential (primary) hypertension: Secondary | ICD-10-CM | POA: Insufficient documentation

## 2012-12-04 DIAGNOSIS — F17200 Nicotine dependence, unspecified, uncomplicated: Secondary | ICD-10-CM | POA: Insufficient documentation

## 2012-12-04 DIAGNOSIS — B373 Candidiasis of vulva and vagina: Secondary | ICD-10-CM | POA: Insufficient documentation

## 2012-12-04 DIAGNOSIS — E1169 Type 2 diabetes mellitus with other specified complication: Secondary | ICD-10-CM | POA: Insufficient documentation

## 2012-12-04 DIAGNOSIS — E782 Mixed hyperlipidemia: Secondary | ICD-10-CM | POA: Insufficient documentation

## 2012-12-04 DIAGNOSIS — Z79899 Other long term (current) drug therapy: Secondary | ICD-10-CM | POA: Insufficient documentation

## 2012-12-04 NOTE — ED Notes (Signed)
Patient c/o vaginal itching x 1 week.  States saw Dr. Renard Matter and was given 2 pills and they have not helped.

## 2012-12-05 ENCOUNTER — Emergency Department (HOSPITAL_COMMUNITY)
Admission: EM | Admit: 2012-12-05 | Discharge: 2012-12-05 | Disposition: A | Discharge status: Home / Self Care | Payer: Self-pay | Attending: Emergency Medicine | Admitting: Emergency Medicine

## 2012-12-05 DIAGNOSIS — B373 Candidiasis of vulva and vagina: Secondary | ICD-10-CM

## 2012-12-05 DIAGNOSIS — R739 Hyperglycemia, unspecified: Secondary | ICD-10-CM

## 2012-12-05 LAB — URINALYSIS, ROUTINE W REFLEX MICROSCOPIC
Bilirubin Urine: NEGATIVE
Glucose, UA: >1000 mg/dL — AB
Ketones, ur: NEGATIVE mg/dL
Nitrite: NEGATIVE
Protein, ur: NEGATIVE mg/dL
Specific Gravity, Urine: 1.01 (ref 1.005–1.030)
Urobilinogen, UA: 0.2 mg/dL (ref 0.0–1.0)
pH: 6 (ref 5.0–8.0)

## 2012-12-05 LAB — GLUCOSE, CAPILLARY
Glucose-Capillary: 431 mg/dL — ABNORMAL HIGH (ref 70–99)
Glucose-Capillary: 368 mg/dL — ABNORMAL HIGH (ref 70–99)

## 2012-12-05 LAB — WET PREP, GENITAL
Trich, Wet Prep: NONE SEEN
Yeast Wet Prep HPF POC: NONE SEEN

## 2012-12-05 LAB — URINE MICROSCOPIC-ADD ON

## 2012-12-05 MED ORDER — FLUCONAZOLE 100 MG PO TABS: 100.0000 mg | ORAL_TABLET | ORAL | Status: AC

## 2012-12-05 MED ORDER — INSULIN ASPART 100 UNIT/ML ~~LOC~~ SOLN
10.0000 [IU] | Freq: Once | SUBCUTANEOUS | Status: AC
  Administered 2012-12-05: 10 [IU] via SUBCUTANEOUS
  Filled 2012-12-05: qty 1

## 2012-12-05 NOTE — ED Notes (Signed)
Discharge instructions given and reviewed with patient.  Prescription given for Diflucan; effects and use explained.  Patient verbalized understanding to take medication as directed and to follow up with PMD.  Patient ambulatory; discharged home in good condition.

## 2012-12-05 NOTE — ED Provider Notes (Signed)
History     CSN: [id number]  Arrival date & time 12/04/12  2338   First MD Initiated Contact with Patient 12/05/12 0135      Chief Complaint  Patient presents with  . Vaginal Itching    (Consider location/radiation/quality/duration/timing/severity/associated sxs/prior treatment) Patient is a 57 y.o. female presenting with vaginal itching. The history is provided by the patient.  Vaginal Itching  She has had itching in the vulva area for the last week. She denies any internal vaginal itching and denies any discharge. Itching is severe. She saw her PCP who prescribed a pink pill which she can take every 12 hours as needed for itching. The total does relieve itching but as soon as it wears off, itching recurs. Patient denies any visible rash and denies any new soaps, laundry detergents, fabric softeners, etc. She is diabetic but states her blood sugars have been under reasonably good control.  Past Medical History  Diagnosis Date  . Type 2 diabetes mellitus   . Essential hypertension, benign   . Mixed hyperlipidemia   . Diabetes mellitus     Past Surgical History  Procedure Laterality Date  . Umbilical hernia repair  2008  . Bilateral tubal ligation      Family History  Problem Relation Age of Onset  . Cancer Father   . COPD Mother     History  Substance Use Topics  . Smoking status: Current Every Day Smoker -- 1.00 packs/day    Types: Cigarettes  . Smokeless tobacco: Never Used  . Alcohol Use: No    OB History   Grav Para Term Preterm Abortions TAB SAB Ect Mult Living                  Review of Systems  All other systems reviewed and are negative.    Allergies  Review of patient's allergies indicates no known allergies.  Home Medications   Current Outpatient Rx  Name  Route  Sig  Dispense  Refill  . ALPRAZolam (XANAX) 0.5 MG tablet   Oral   Take 0.5 mg by mouth every 4 (four) hours as needed. FOR ANXIETY          . HYDROcodone-acetaminophen  (LORTAB) 7.5-500 MG per tablet   Oral   Take 1 tablet by mouth every 4 (four) hours as needed. FOR PAIN           . metFORMIN (GLUCOPHAGE) 500 MG tablet   Oral   Take 500 mg by mouth every morning.           . rosuvastatin (CRESTOR) 10 MG tablet   Oral   Take 10 mg by mouth daily. SAMPLES          . UNKNOWN TO PATIENT   Oral   Take 1 tablet by mouth daily. For stress           BP 152/91  Pulse 86  Temp(Src) 98.9 F (37.2 C) (Oral)  Resp 20  Ht 5\' 4"  (1.626 m)  Wt 200 lb (90.719 kg)  BMI 34.31 kg/m2  SpO2 99%  Physical Exam  Nursing note and vitals reviewed.  56 year old female, resting comfortably and in no acute distress. Vital signs are  significant for hypertension with blood pressure 152/91 . Oxygen saturation is 99%, which is normal. Head is normocephalic and atraumatic. PERRLA, EOMI. Oropharynx is clear. Neck is nontender and supple without adenopathy or JVD. Back is nontender and there is no CVA tenderness. Lungs are clear without  rales, wheezes, or rhonchi. Chest is nontender. Heart has regular rate and rhythm without murmur. Abdomen is soft, flat, nontender without masses or hepatosplenomegaly and peristalsis is normoactive. Pelvic: Moderate erythema of the vulva and labia majora minora him. This is consistent with monilial infection. Cervix is normal and very small amount of thin white vaginal discharge present. Extremities have no cyanosis or edema, full range of motion is present. Skin is warm and dry without rash. Neurologic: Mental status is normal, cranial nerves are intact, there are no motor or sensory deficits.  ED Course  Procedures (including critical care time)  Results for orders placed during the hospital encounter of 12/05/12  WET PREP, GENITAL      Result Value Range   Yeast Wet Prep HPF POC NONE SEEN  NONE SEEN   Trich, Wet Prep NONE SEEN  NONE SEEN   Clue Cells Wet Prep HPF POC FEW (*) NONE SEEN   WBC, Wet Prep HPF POC FEW (*)  NONE SEEN  URINALYSIS, ROUTINE W REFLEX MICROSCOPIC      Result Value Range   Color, Urine YELLOW  YELLOW   APPearance CLEAR  CLEAR   Specific Gravity, Urine 1.010  1.005 - 1.030   pH 6.0  5.0 - 8.0   Glucose, UA >1000 (*) NEGATIVE mg/dL   Hgb urine dipstick MODERATE (*) NEGATIVE   Bilirubin Urine NEGATIVE  NEGATIVE   Ketones, ur NEGATIVE  NEGATIVE mg/dL   Protein, ur NEGATIVE  NEGATIVE mg/dL   Urobilinogen, UA 0.2  0.0 - 1.0 mg/dL   Nitrite NEGATIVE  NEGATIVE   Leukocytes, UA SMALL (*) NEGATIVE  GLUCOSE, CAPILLARY      Result Value Range   Glucose-Capillary 431 (*) 70 - 99 mg/dL  URINE MICROSCOPIC-ADD ON      Result Value Range   Squamous Epithelial / LPF MANY (*) RARE   WBC, UA 7-10  <3 WBC/hpf   RBC / HPF 3-6  <3 RBC/hpf   Bacteria, UA FEW (*) RARE      1. Monilial vulvitis   2. Hyperglycemia       MDM  Vulvar itching most likely monilial. Swab will be obtained to look for evidence of a monilial vaginal infection. CBC will be checked. The pink pill that she had been taking for itching is probably Allegra.   Glucose is over 400, and she is given a dose of Insulin. Wet prep is negative for yeast, as is UA. She will be treated with fluconazole, and she is encouraged to maintain tighter glucose control.  Dione Booze, MD 12/05/12 [phone]

## 2012-12-06 LAB — URINE CULTURE: Colony Count: 80000

## 2013-05-27 ENCOUNTER — Emergency Department (HOSPITAL_COMMUNITY)
Admission: EM | Admit: 2013-05-27 | Discharge: 2013-05-27 | Disposition: A | Discharge status: Home / Self Care | Payer: BC Managed Care – PPO

## 2014-02-23 ENCOUNTER — Ambulatory Visit (HOSPITAL_COMMUNITY)
Admission: RE | Admit: 2014-02-23 | Discharge: 2014-02-23 | Disposition: A | Discharge status: Home / Self Care | Payer: Disability Insurance | Source: Ambulatory Visit | Attending: Family Medicine | Admitting: Family Medicine

## 2014-02-23 ENCOUNTER — Other Ambulatory Visit (HOSPITAL_COMMUNITY): Payer: Self-pay | Admitting: *Deleted

## 2014-02-23 DIAGNOSIS — M545 Low back pain, unspecified: Secondary | ICD-10-CM

## 2014-02-23 DIAGNOSIS — M51379 Other intervertebral disc degeneration, lumbosacral region without mention of lumbar back pain or lower extremity pain: Secondary | ICD-10-CM | POA: Insufficient documentation

## 2014-02-23 DIAGNOSIS — R202 Paresthesia of skin: Secondary | ICD-10-CM

## 2014-02-23 DIAGNOSIS — M4120 Other idiopathic scoliosis, site unspecified: Secondary | ICD-10-CM | POA: Diagnosis not present

## 2014-02-23 DIAGNOSIS — M5137 Other intervertebral disc degeneration, lumbosacral region: Secondary | ICD-10-CM | POA: Diagnosis not present

## 2014-02-23 DIAGNOSIS — R200 Anesthesia of skin: Secondary | ICD-10-CM

## 2014-06-09 ENCOUNTER — Emergency Department (HOSPITAL_COMMUNITY): Payer: Self-pay

## 2014-06-09 ENCOUNTER — Emergency Department (HOSPITAL_COMMUNITY)
Admission: EM | Admit: 2014-06-09 | Discharge: 2014-06-09 | Disposition: A | Discharge status: Home / Self Care | Payer: Self-pay | Attending: Emergency Medicine | Admitting: Emergency Medicine

## 2014-06-09 ENCOUNTER — Encounter (HOSPITAL_COMMUNITY): Payer: Self-pay | Admitting: Cardiology

## 2014-06-09 DIAGNOSIS — I671 Cerebral aneurysm, nonruptured: Secondary | ICD-10-CM | POA: Insufficient documentation

## 2014-06-09 DIAGNOSIS — E782 Mixed hyperlipidemia: Secondary | ICD-10-CM | POA: Insufficient documentation

## 2014-06-09 DIAGNOSIS — Y929 Unspecified place or not applicable: Secondary | ICD-10-CM | POA: Insufficient documentation

## 2014-06-09 DIAGNOSIS — Y998 Other external cause status: Secondary | ICD-10-CM | POA: Insufficient documentation

## 2014-06-09 DIAGNOSIS — R9089 Other abnormal findings on diagnostic imaging of central nervous system: Secondary | ICD-10-CM

## 2014-06-09 DIAGNOSIS — S1081XA Abrasion of other specified part of neck, initial encounter: Secondary | ICD-10-CM | POA: Insufficient documentation

## 2014-06-09 DIAGNOSIS — Z23 Encounter for immunization: Secondary | ICD-10-CM | POA: Insufficient documentation

## 2014-06-09 DIAGNOSIS — R930 Abnormal findings on diagnostic imaging of skull and head, not elsewhere classified: Secondary | ICD-10-CM | POA: Insufficient documentation

## 2014-06-09 DIAGNOSIS — S0081XA Abrasion of other part of head, initial encounter: Secondary | ICD-10-CM | POA: Insufficient documentation

## 2014-06-09 DIAGNOSIS — I10 Essential (primary) hypertension: Secondary | ICD-10-CM | POA: Insufficient documentation

## 2014-06-09 DIAGNOSIS — S62629A Displaced fracture of medial phalanx of unspecified finger, initial encounter for closed fracture: Secondary | ICD-10-CM

## 2014-06-09 DIAGNOSIS — Y9389 Activity, other specified: Secondary | ICD-10-CM | POA: Insufficient documentation

## 2014-06-09 DIAGNOSIS — S63253A Unspecified dislocation of left middle finger, initial encounter: Secondary | ICD-10-CM | POA: Insufficient documentation

## 2014-06-09 DIAGNOSIS — E119 Type 2 diabetes mellitus without complications: Secondary | ICD-10-CM | POA: Insufficient documentation

## 2014-06-09 DIAGNOSIS — W19XXXA Unspecified fall, initial encounter: Secondary | ICD-10-CM

## 2014-06-09 DIAGNOSIS — T07XXXA Unspecified multiple injuries, initial encounter: Secondary | ICD-10-CM

## 2014-06-09 DIAGNOSIS — W01198A Fall on same level from slipping, tripping and stumbling with subsequent striking against other object, initial encounter: Secondary | ICD-10-CM | POA: Insufficient documentation

## 2014-06-09 DIAGNOSIS — S62603A Fracture of unspecified phalanx of left middle finger, initial encounter for closed fracture: Secondary | ICD-10-CM

## 2014-06-09 DIAGNOSIS — S60512A Abrasion of left hand, initial encounter: Secondary | ICD-10-CM | POA: Insufficient documentation

## 2014-06-09 DIAGNOSIS — Z79899 Other long term (current) drug therapy: Secondary | ICD-10-CM | POA: Insufficient documentation

## 2014-06-09 LAB — I-STAT CHEM 8, ED
BUN: 11 mg/dL (ref 6–23)
Calcium, Ion: 1.16 mmol/L (ref 1.12–1.23)
Chloride: 102 meq/L (ref 96–112)
Creatinine, Ser: 0.7 mg/dL (ref 0.50–1.10)
Glucose, Bld: 240 mg/dL — ABNORMAL HIGH (ref 70–99)
HCT: 40 % (ref 36.0–46.0)
Hemoglobin: 13.6 g/dL (ref 12.0–15.0)
Potassium: 4.2 meq/L (ref 3.7–5.3)
Sodium: 138 meq/L (ref 137–147)
TCO2: 21 mmol/L (ref 0–100)

## 2014-06-09 MED ORDER — IOHEXOL 350 MG/ML SOLN
80.0000 mL | Freq: Once | INTRAVENOUS | Status: AC | PRN
  Administered 2014-06-09: 80 mL via INTRAVENOUS

## 2014-06-09 MED ORDER — TETANUS-DIPHTH-ACELL PERTUSSIS 5-2.5-18.5 LF-MCG/0.5 IM SUSP
0.5000 mL | Freq: Once | INTRAMUSCULAR | Status: AC
  Administered 2014-06-09: 0.5 mL via INTRAMUSCULAR
  Filled 2014-06-09: qty 0.5

## 2014-06-09 MED ORDER — OXYCODONE-ACETAMINOPHEN 5-325 MG PO TABS
1.0000 | ORAL_TABLET | Freq: Once | ORAL | Status: AC
  Administered 2014-06-09: 1 via ORAL
  Filled 2014-06-09: qty 1

## 2014-06-09 MED ORDER — HYDROCODONE-ACETAMINOPHEN 5-325 MG PO TABS: ORAL_TABLET | ORAL | Status: DC

## 2014-06-09 NOTE — ED Provider Notes (Signed)
CSN: [id number]     Arrival date & time 06/09/14  1358 History   First MD Initiated Contact with Patient 06/09/14 1410     Chief Complaint  Patient presents with  . Fall     HPI Pt was seen at 1415. Per pt and her family, c/o sudden onset and resolution of one episode of slip and fall that occurred PTA. Pt states she fell forward, hitting her left knee, left side of her forehead and left hand on the ground. Denies syncope/LOC, no AMS, no neck or back pain, no CP/palpitations, no SOB/cough, no abd pain, no N/V/D, no focal motor weakness, no tingling/numbness in extremities.     Unk Td Past Medical History  Diagnosis Date  . Type 2 diabetes mellitus   . Essential hypertension, benign   . Mixed hyperlipidemia   . Diabetes mellitus    Past Surgical History  Procedure Laterality Date  . Umbilical hernia repair  2008  . Bilateral tubal ligation     Family History  Problem Relation Age of Onset  . Cancer Father   . COPD Mother    History  Substance Use Topics  . Smoking status: Current Every Day Smoker -- 1.00 packs/day    Types: Cigarettes  . Smokeless tobacco: Never Used  . Alcohol Use: No    Review of Systems ROS: Statement: All systems negative except as marked or noted in the HPI; Constitutional: Negative for fever and chills. ; ; Eyes: Negative for eye pain, redness and discharge. ; ; ENMT: Negative for ear pain, hoarseness, nasal congestion, sinus pressure and sore throat. ; ; Cardiovascular: Negative for chest pain, palpitations, diaphoresis, dyspnea and peripheral edema. ; ; Respiratory: Negative for cough, wheezing and stridor. ; ; Gastrointestinal: Negative for nausea, vomiting, diarrhea, abdominal pain, blood in stool, hematemesis, jaundice and rectal bleeding. . ; ; Genitourinary: Negative for dysuria, flank pain and hematuria. ; ; Musculoskeletal: +left hand pain, left knee pain, head injury. Negative for back pain and neck pain. Negative for deformity.; ; Skin:  +abrasions. Negative for pruritus, rash, blisters, bruising and skin lesion.; ; Neuro: Negative for headache, lightheadedness and neck stiffness. Negative for weakness, altered level of consciousness , altered mental status, extremity weakness, paresthesias, involuntary movement, seizure and syncope.      Allergies  Review of patient's allergies indicates no known allergies.  Home Medications   Prior to Admission medications   Medication Sig Start Date End Date Taking? Authorizing Provider  metFORMIN (GLUCOPHAGE) 500 MG tablet Take 500 mg by mouth 2 (two) times daily with a meal.    Yes Historical Provider, MD  Olmesartan Medoxomil (BENICAR PO) Take by mouth.   Yes Historical Provider, MD  UNKNOWN TO PATIENT Take 1 tablet by mouth daily. For stress   Yes Historical Provider, MD  ALPRAZolam Prudy Feeler(XANAX) 0.5 MG tablet Take 0.5 mg by mouth every 4 (four) hours as needed. FOR ANXIETY     Historical Provider, MD  HYDROcodone-acetaminophen (LORTAB) 7.5-500 MG per tablet Take 1 tablet by mouth every 4 (four) hours as needed. FOR PAIN      Historical Provider, MD  rosuvastatin (CRESTOR) 10 MG tablet Take 10 mg by mouth daily. SAMPLES     Historical Provider, MD   BP 149/81 mmHg  Pulse 74  Temp(Src) 97.9 F (36.6 C) (Oral)  Resp 16  SpO2 95% Physical Exam  1420: Physical examination: Vital signs and O2 SAT: Reviewed; Constitutional: Well developed, Well nourished, Well hydrated, In no acute distress; Head and  Face: Normocephalic, No scalp hematomas, no lacs. +superficial abrasion left forehead. Non-tender to palp superior and inferior orbital rim areas.  No zygoma tenderness.  No mandibular tenderness.; Eyes: EOMI, PERRL, No scleral icterus; ENMT: Mouth and pharynx normal, Left TM normal, Right TM normal, Mucous membranes moist, +teeth and tongue intact.  No intraoral or intranasal bleeding.  No septal hematomas.  No trismus, no malocclusion.; Neck: Supple, Trachea midline; Spine:  No midline CS, TS,  LS tenderness.; Cardiovascular: Regular rate and rhythm, No murmur, rub, or gallop; Respiratory: Breath sounds clear & equal bilaterally, No rales, rhonchi, wheezes, Normal respiratory effort/excursion; Chest: Nontender, No deformity, Movement normal, No crepitus, No abrasions or ecchymosis.; Abdomen: Soft, Nontender, Nondistended, Normal bowel sounds, No abrasions or ecchymosis.; Genitourinary: No CVA tenderness; Extremities: No deformity, Full range of motion major/large joints of bilat UE's and LE's without pain or tenderness to palp, Neurovascularly intact, Pulses normal, +FROM left knee, including able to lift extended LLE off stretcher, and extend left lower leg against resistance.  No ligamentous laxity.  No patellar or quad tendon step-offs.  NMS intact left foot, strong pedal pp. +plantarflexion of left foot w/calf squeeze.  No palpable gap left Achilles's tendon.  No proximal fibular head tenderness.  No edema, erythema, warmth, ecchymosis or deformity. +superficial abrasion to patellar area. +generalized TTP left hand. No left snuffbox tenderness.  No pain to axial thumb or 3rd MCP loading.  Left forearm compartments soft, strong radial pp, brisk cap refill in fingers. Left hand NMS intact. NT left wrist/elbow/shoulder. No abrasions, no ecchymosis, no erythema. No edema, Pelvis stable; Neuro: AA&Ox3, GCS 15.  Major CN grossly intact. Speech clear. No gross focal motor or sensory deficits in extremities. Climbs on and off stretcher easily by herself. Gait steady.; Skin: Color normal, Warm, Dry    ED Course  Procedures     EKG Interpretation None      MDM  MDM Reviewed: previous chart, nursing note and vitals Interpretation: x-ray and CT scan   Results for orders placed or performed during the hospital encounter of 06/09/14  I-stat Chem 8, ED  Result Value Ref Range   Sodium 138 137 - 147 mEq/L   Potassium 4.2 3.7 - 5.3 mEq/L   Chloride 102 96 - 112 mEq/L   BUN 11 6 - 23 mg/dL    Creatinine, Ser 1.61 0.50 - 1.10 mg/dL   Glucose, Bld 096 (H) 70 - 99 mg/dL   Calcium, Ion 0.45 4.09 - 1.23 mmol/L   TCO2 21 0 - 100 mmol/L   Hemoglobin 13.6 12.0 - 15.0 g/dL   HCT 81.1 91.4 - 78.2 %   Ct Angio Head W/cm &/or Wo Cm 06/09/2014   CLINICAL DATA:  Fall.  Abnormal head CT.  EXAM: CT ANGIOGRAPHY HEAD  TECHNIQUE: Multidetector CT imaging of the head was performed using the standard protocol during bolus administration of intravenous contrast. Multiplanar CT image reconstructions and MIPs were obtained to evaluate the vascular anatomy.  CONTRAST:  80mL OMNIPAQUE IOHEXOL 350 MG/ML SOLN  COMPARISON:  Head CT earlier today  FINDINGS: Postcontrast head CT is without evidence of acute large territory infarct, mass, midline shift, intracranial hemorrhage, or extra-axial fluid collection. No abnormal brain parenchymal or meningeal enhancement is identified. Orbits are unremarkable. Visualized paranasal sinuses and mastoid air cells are clear.  Visualized distal vertebral arteries are patent with the left being dominant in the right being hypoplastic. Mild intracranial left vertebral artery calcification is noted without significant stenosis. AICA and SCA origins are  patent. Basilar artery is patent without stenosis. There is an aneurysm projecting superiorly from the basilar tip measuring 9 x 12 x 10 mm. This may involve the PCA origins, which are patent. There is a patent right posterior communicating artery. No PCA stenosis is seen.  Internal carotid arteries are patent from skullbase to carotid termini without stenosis. 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 MCAs are otherwise unremarkable without evidence of stenosis. ACAs are patent without evidence of stenosis. An azygous A2 is noted.  Review of the MIP images confirms the above findings.  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   Electronically Signed   By: Sebastian Ache   On: 06/09/2014 17:10   Ct Head Wo Contrast 06/09/2014   CLINICAL DATA:  Fall.  Bruising and abrasions.  EXAM: CT HEAD WITHOUT CONTRAST  CT CERVICAL SPINE WITHOUT CONTRAST  TECHNIQUE: Multidetector CT imaging of the head and cervical spine was performed following the standard protocol without intravenous contrast. Multiplanar CT image reconstructions of the cervical spine were also generated.  COMPARISON:  None.  FINDINGS: CT HEAD FINDINGS  No intracranial hemorrhage. The basilar artery appears tortuous with a 10 x 9 mm hyperdensity that appears contiguous with the basilar tip concerning for basilar tip aneurysm. There is no evidence of rupture or intracranial hemorrhage. No mass effect or midline shift. No CT findings to suggest infarct. No subdural hematoma or intracranial fluid collection. The ventricles are normal. The no calvarial fracture. There is scattered paranasal sinus mucosal thickening involving the frontal sinus and right ethmoid air cells. Mastoid air cells are well aerated.  CT CERVICAL SPINE FINDINGS  Mild straightening of normal lordosis. There is no listhesis. Vertebral body heights are normal. The dens is intact. Mild disc space narrowing from C4-C5 through C6-C7 with associated endplate spurring. Facet arthropathy at C4-C5 and C5-C6, right greater than left. No jumped or perched facets. There is no prevertebral soft tissue edema.  IMPRESSION: 1. No acute intracranial abnormality. 2. 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 Recommend CTA or MRI for further evaluation. There is no evidence of aneurysm rupture. 3. Degenerative change in the cervical spine without acute fracture. These results will be called to the ordering clinician or representative by the Radiologist Assistant, and communication documented in the PACS or zVision Dashboard.   Electronically Signed   By: Rubye Oaks M.D.   On: 06/09/2014  15:59   Ct Cervical Spine Wo Contrast 06/09/2014   CLINICAL DATA:  Fall.  Bruising and abrasions.  EXAM: CT HEAD WITHOUT CONTRAST  CT CERVICAL SPINE WITHOUT CONTRAST  TECHNIQUE: Multidetector CT imaging of the head and cervical spine was performed following the standard protocol without intravenous contrast. Multiplanar CT image reconstructions of the cervical spine were also generated.  COMPARISON:  None.  FINDINGS: CT HEAD FINDINGS  No intracranial hemorrhage. The basilar artery appears tortuous with a 10 x 9 mm hyperdensity that appears contiguous with the basilar tip concerning for basilar tip aneurysm. There is no evidence of rupture or intracranial hemorrhage. No mass effect or midline shift. No CT findings to suggest infarct. No subdural hematoma or intracranial fluid collection. The ventricles are normal. The no calvarial fracture. There is scattered paranasal sinus mucosal thickening involving the frontal sinus and right ethmoid air cells. Mastoid air cells are well aerated.  CT CERVICAL SPINE  FINDINGS  Mild straightening of normal lordosis. There is no listhesis. Vertebral body heights are normal. The dens is intact. Mild disc space narrowing from C4-C5 through C6-C7 with associated endplate spurring. Facet arthropathy at C4-C5 and C5-C6, right greater than left. No jumped or perched facets. There is no prevertebral soft tissue edema.  IMPRESSION: 1. No acute intracranial abnormality. 2. 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 Recommend CTA or MRI for further evaluation. There is no evidence of aneurysm rupture. 3. Degenerative change in the cervical spine without acute fracture. These results will be called to the ordering clinician or representative by the Radiologist Assistant, and communication documented in the PACS or zVision Dashboard.   Electronically Signed   By: Rubye OaksMelanie  Ehinger M.D.   On: 06/09/2014 15:59   Dg Knee  Complete 4 Views Left 06/09/2014   CLINICAL DATA:  Fall last night  EXAM: LEFT KNEE - COMPLETE 4+ VIEW  COMPARISON:  None.  FINDINGS: Four views of the left knee submitted. No acute fracture or subluxation. There is narrowing of medial joint compartment. Spurring of medial and lateral tibial plateau. Significant narrowing of patellofemoral joint space. Spurring of patella. Tiny joint effusion.  IMPRESSION: No acute fracture or subluxation. Osteoarthritic changes as described above. Tiny joint effusion P   Electronically Signed   By: Natasha MeadLiviu  Pop M.D.   On: 06/09/2014 15:41   Dg Hand Complete Left 06/09/2014   CLINICAL DATA:  Fall yesterday with left hand swelling and bruising, initial encounter  EXAM: LEFT HAND - COMPLETE 3+ VIEW  COMPARISON:  None.  FINDINGS: The examination is somewhat limited due to the patient's inability to adequately straightening is fingers. Irregularity is noted at the base of the third middle phalanx which may represent an avulsion fracture. No other focal abnormality is seen.  IMPRESSION: Changes suggestive of an avulsion fracture at the base of the third middle phalanx. Correlation with the clinical exam is recommended.   Electronically Signed   By: Alcide CleverMark  Lukens M.D.   On: 06/09/2014 15:44    1740:  Pt continues NAD, resps easy, neuro exam intact and unchanged.  [facility] Jeral FruitBotero, case discussed, including:  HPI, pertinent PM/SHx, VS/PE, dx testing, ED course and treatment:  He has viewed the images, no acute neurosurgical emergency at this time, but pt will need office f/u to discuss treatment/intervention, he will have his office call pt on Monday to schedule f/u. Dx and testing, as well as d/w Neurosurgeon, d/w pt and family.  Questions answered.  Verb understanding, agreeable to d/c home with outpt f/u.   Samuel JesterKathleen Xena Propst, DO 06/12/14 1453

## 2014-06-09 NOTE — Discharge Instructions (Signed)
°Emergency Department Resource Guide °1) Find a Doctor and Pay Out of Pocket °Although you won't have to find out who is covered by your insurance plan, it is a good idea to ask around and get recommendations. You will then need to call the office and see if the doctor you have chosen will accept you as a new patient and what types of options they offer for patients who are self-pay. Some doctors offer discounts or will set up payment plans for their patients who do not have insurance, but you will need to ask so you aren't surprised when you get to your appointment. ° °2) Contact Your Local Health Department °Not all health departments have doctors that can see patients for sick visits, but many do, so it is worth a call to see if yours does. If you don't know where your local health department is, you can check in your phone book. The CDC also has a tool to help you locate your state's health department, and many state websites also have listings of all of their local health departments. ° °3) Find a Walk-in Clinic °If your illness is not likely to be very severe or complicated, you may want to try a walk in clinic. These are popping up all over the country in pharmacies, drugstores, and shopping centers. They're usually staffed by nurse practitioners or physician assistants that have been trained to treat common illnesses and complaints. They're usually fairly quick and inexpensive. However, if you have serious medical issues or chronic medical problems, these are probably not your best option. ° °No Primary Care Doctor: °- Call [facility] at  832-8000 - they can help you locate a primary care doctor that  accepts your insurance, provides certain services, etc. °- Physician Referral Service- 1-800-533-3463 ° °Chronic Pain Problems: °Organization         Address  Phone   Notes  °[facility]  (336) 297-2271 Patients need to be referred by their primary care doctor.  ° °Medication  Assistance: °Organization         Address  Phone   Notes  °[place] [facility] [address] °[place], [place] [address] (336) 641-8030 --Must be a resident of [place] °-- Must have NO insurance coverage whatsoever (no Medicaid/ Medicare, etc.) °-- The pt. MUST have a primary care doctor that directs their care regularly and follows them in the community °  °[facility]  (866) 331-1348   °[employer]  (888) 892-1162   ° °Agencies that provide inexpensive medical care: °Organization         Address  Phone   Notes  °[facility]  (336) 832-8035   °[facility]    (336) 832-7272   °[facility] [address] °[place], [place] [address] (336) 832-4777   °[facility] [address], °[place] (336) 271-4999   °[facility]    (336) 373-0678   °[facility]    (336) 272-1050   °[facility] ° [address], [place] Phone:  (336) 832-4444, Fax:  (336) 832-4440 Hours of Operation:  9 am - 6 pm, M-F.  Also accepts Medicaid/Medicare and self-pay.  °[facility] ° [address], [place] Phone: (336) 832-3150, Fax: (336) [phone]. Hours of Operation:  8:30 am - 5:30 pm, M-F.  Also accepts Medicaid and self-pay.  °[facility] [place] [address], [place] Phone: (336) 878-6027   °[facility] [address], [place], [place] (336)723-1848, Ext. 123 Mondays & Thursdays: 7-9 AM.  First 15 patients are seen on a first come, first serve basis. °  ° °Medicaid-accepting [place] Providers: ° °Organization         Address  Phone   Notes  °[facility] [address], [place] (336) 641-2100 Also accepts self-pay patients.  °[facility] [address], [place] ° (336) 856-9996   °[facility] [address], [place]  (336) 288-8857   °[facility] [address] [place] [address], [place] (336) 299-7000   °Veita Bland [address], [place]  ° (336) 373-1557 Only accepts [place] Access Medicaid patients after they have their name applied to their card.  ° °Self-Pay (no insurance) in [place]: ° °Organization         Address  Phone   Notes  °Sickle Cell Patients, [facility] [address], [place] (336) 832-1970   °[facility] [address], [place] (336) 832-4400   °[facility] ° [address], [place] (336) 992-4800   °[facility]/Dr. Osei-Bonsu ° [address] [place] [address], [place] or [address], [place] (336) 841-8500 Phone number for both [place] and [place] locations is the same.  °[facility] [address], [place] (336) 299-0000   °[facility] [address] [place] [address], [place] or [address] (336) 852-7530 °(336) 878-2260   °[facility] [address], [place] (336) 350-1642, phone; (336) 294-5005, fax Sees patients 1st and 3rd Saturday of every month.  Must not qualify for public or private insurance (i.e. Medicaid, Medicare, [place] Health Choice, Veterans' Benefits) • Household income should be no more than 200% of the poverty level •The clinic cannot treat you if you are pregnant or think you are pregnant • Sexually transmitted diseases are not treated at the clinic.  ° ° °Dental Care: °Organization         Address  Phone  Notes  °[place] [facility] [address], [place] (336) 641-6152 Accepts children up to age 21 who are enrolled in Medicaid or [place] Health Choice; pregnant women with a Medicaid card; and children who have applied for Medicaid or [place] Health Choice, but were declined, whose parents can pay a reduced fee at time of service.  °[place]  [facility] [place]  [address], [place] (336) 641-7733 Accepts children up to age 21 who are enrolled in Medicaid or [place] Health Choice; pregnant women with a Medicaid card; and children who have applied for Medicaid or [place] Health Choice, but were declined, whose parents can pay a reduced fee at time of service.  °[facility] ° [address], [place] (336) 641-4533 Patients are seen by appointment only. Walk-ins are not accepted. [facility] will see patients 18 years of age and older. °Monday - Tuesday (8am-5pm) °Most Wednesdays (8:30-5pm) °$30 per visit, cash only  °[facility] ° [address], [place] (336) 641-4533 Patients are seen by appointment only. Walk-ins are not accepted. [facility] will see patients 18 years of age and older. °One   Wednesday Evening (Monthly: Volunteer Based).  $30 per visit, cash only  °[facility]  (919) 537-3737 for adults; Children under age 4, call Graduate Pediatric Dentistry at ([phone]) [phone]. Children aged 4-14, please call ([phone]) [phone] to request a pediatric application. ° Dental services are provided in all areas of dental care including fillings, crowns and bridges, complete and partial dentures, implants, gum treatment, root canals, and extractions. Preventive care is also provided. Treatment is provided to both adults and children. °Patients are selected via a lottery and there is often a waiting list. °  °[facility] [address], °[place] ° (336) 763-8833 [email or web address] °  °[facility] [address], [place], [place] (336)723-1848, Ext. 123 Second and Fourth Thursday of each month, opens at 6:30 AM; Clinic ends at 9 AM.  Patients are seen on a first-come first-served basis, and a limited number are seen during each clinic.  ° °[facility] ° [address], [place], [place] (336) 723-7904    Eligibility Requirements °You must have lived in [place], [place], or [place] counties for at least the last three months. °  You cannot be eligible for state or federal sponsored healthcare insurance, including Veterans Administration, Medicaid, or Medicare. °  You generally cannot be eligible for healthcare insurance through your employer.  °  How to apply: °Eligibility screenings are held every Tuesday and Wednesday afternoon from 1:00 pm until 4:00 pm. You do not need an appointment for the interview!  °[facility] [address], [place], [place] 336-631-2330   °[facility]  336-342-8273   °[facility]  336-703-3100   °[facility]  336-570-6415   ° °Behavioral Health Resources in the Community: °Intensive Outpatient Programs °Organization         Address  Phone  Notes  °[facility] [address], [place], [place] 336-878-6098   °[facility] [address], [place], [place] 336-832-9800   °[facility] [address], [place], [place] ° 336-882-2125   °[facility] [address],  °[place], [place] 1-800-853-5163 or 336-641-4981   °Substance Abuse Resources °Organization         Address  Phone  Notes  °[facility]  336-882-2125   °[facility]  336-784-9470   °[facility]  336-285-9073   °[facility]  336-845-3988   °Residential & Outpatient Substance Abuse Program  1-800-659-3381   °Psychological Services °Organization         Address  Phone  Notes  °[facility]  336- 832-9600   °[facility]  336- 378-7881   °[facility] [address], [place] 1-800-853-5163 or 336-641-4981   ° °Mobile Crisis Teams °Organization         Address  Phone  Notes  °[facility], Mobile Crisis Care Unit  1-877-626-1772   °Assertive °[facility] ° [address]  [place], [place] 336-834-9664   °Sharon DeEsch [address] °[place] 336-554-5454   ° °Self-Help/Support Groups °Organization         Address  Phone             Notes  °[facility]  - variety of support groups  336- 373-1402 Call for more information  °Narcotics Anonymous (NA), [facility] [address], °[place]  2 meetings at this location  ° °  Residential Treatment Programs Organization         Address  Phone  Notes  [facility] [address],    [place]  [phone]   [facility]  [address], [place] [phone], [place], [place] [phone]   [facility] [address], [place] [phone] Admissions: 8am-3pm M-F  [facility] [address]    [place], [place] [phone]   [facility] [address], [place], [place] [phone]   [facility] [address]  [place], [place] [phone]   [facility] Intensive Outpatient [address], [place], [place] [phone]   [facility] [address]  [place], [place] [phone] or [phone]   [facility] [address] [place], [place] [phone] Accepts Medicaid  [facility] [address]  [place] [phone]) [phone] Substance Abuse/Addiction Treatment   [facility]         Address  Phone  Notes  [facility]  704-203-5181(888) 803-210-6252   Angie FavaJulie Brannon, PhD [address] [place], [place]   [phone](336) [phone] or (563) [phone](336) [phone]   [facility]   [address] [place], [place] ([phone](336) ([phone]) [phone]   [facility] [address], [place], [place] [phone](336) [phone])[phone] Insurance/Medicaid/sponsorship through [facility] [address]                                    [place], [place] [phone](336) [phone] Therapy/tele-psych/case    [facility] [address]   [place], [place] [phone](336) [phone]    Dr. Lolly MustacheArfeen  [phone](336) [phone]   [facility] [place]  [employer] [facility] 1) [address], [place] 2) [address], [place] 3)  [address], [place] [phone](336) [phone](336) [phone]  [phone](336) [phone]   [facility] Child Abuse Hotline (517) 552-0975(336) 619-718-5346 or [phone](336) 772-484-0056 (After Hours)      Take the prescription as directed.  Apply moist heat or ice to the area(s) of discomfort, for 15 minutes at a time, several times per day for the next few days.  Do not fall asleep on a heating or ice pack.  Elevate your left hand as much as possible.  Wear the finger splint until you are seen in follow up by the Orthopedist. Wash the abraded areas with soap and water at least twice a day, and cover with a clean/dry dressing.  Change the dressing whenever it becomes wet or soiled after washing the area with soap and water. 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 If you do not hear from Dr. Cassandria SanteeBotero's office by Monday afternoon, call the office to obtain a follow up appointment. The Neurosurgeon is expecting to see you in follow up. Also call the Orthopedic doctor and your regular medical doctor on Monday to schedule a follow up appointment within the next week for a recheck of your "broken finger" and your abrasions.  Return to the Emergency Department immediately sooner if worsening.

## 2014-06-09 NOTE — ED Notes (Addendum)
Fall today.  States she tripped.  C/o pain to left hand,   Left knee and states she hit her head. Pt has a ring on left hand that she was unable to get off.

## 2014-06-26 ENCOUNTER — Other Ambulatory Visit (HOSPITAL_COMMUNITY): Payer: Self-pay | Admitting: Neurosurgery

## 2014-06-26 DIAGNOSIS — I671 Cerebral aneurysm, nonruptured: Secondary | ICD-10-CM

## 2014-07-16 ENCOUNTER — Other Ambulatory Visit: Payer: Self-pay | Admitting: Radiology

## 2014-07-17 ENCOUNTER — Encounter (HOSPITAL_COMMUNITY): Payer: Self-pay | Admitting: Pharmacy Technician

## 2014-07-18 ENCOUNTER — Ambulatory Visit (HOSPITAL_COMMUNITY)
Admission: RE | Admit: 2014-07-18 | Discharge: 2014-07-18 | Disposition: A | Discharge status: Home / Self Care | Payer: Disability Insurance | Source: Ambulatory Visit | Attending: Neurosurgery | Admitting: Neurosurgery

## 2014-07-18 ENCOUNTER — Other Ambulatory Visit (HOSPITAL_COMMUNITY): Payer: Self-pay | Admitting: Neurosurgery

## 2014-07-18 DIAGNOSIS — Z794 Long term (current) use of insulin: Secondary | ICD-10-CM | POA: Insufficient documentation

## 2014-07-18 DIAGNOSIS — I671 Cerebral aneurysm, nonruptured: Secondary | ICD-10-CM

## 2014-07-18 DIAGNOSIS — E782 Mixed hyperlipidemia: Secondary | ICD-10-CM | POA: Insufficient documentation

## 2014-07-18 DIAGNOSIS — I10 Essential (primary) hypertension: Secondary | ICD-10-CM | POA: Insufficient documentation

## 2014-07-18 DIAGNOSIS — Z79899 Other long term (current) drug therapy: Secondary | ICD-10-CM | POA: Insufficient documentation

## 2014-07-18 DIAGNOSIS — E119 Type 2 diabetes mellitus without complications: Secondary | ICD-10-CM | POA: Insufficient documentation

## 2014-07-18 DIAGNOSIS — F17210 Nicotine dependence, cigarettes, uncomplicated: Secondary | ICD-10-CM | POA: Insufficient documentation

## 2014-07-18 DIAGNOSIS — Z8489 Family history of other specified conditions: Secondary | ICD-10-CM | POA: Insufficient documentation

## 2014-07-18 HISTORY — PX: CEREBRAL ANGIOGRAM: SHX1326

## 2014-07-18 LAB — CBC WITH DIFFERENTIAL/PLATELET
Basophils Absolute: 0.1 10*3/uL (ref 0.0–0.1)
Basophils Relative: 1 % (ref 0–1)
Eosinophils Absolute: 0.2 10*3/uL (ref 0.0–0.7)
Eosinophils Relative: 3 % (ref 0–5)
HCT: 36.8 % (ref 36.0–46.0)
Hemoglobin: 12.5 g/dL (ref 12.0–15.0)
Lymphocytes Relative: 37 % (ref 12–46)
Lymphs Abs: 2.2 10*3/uL (ref 0.7–4.0)
MCH: 29.6 pg (ref 26.0–34.0)
MCHC: 34 g/dL (ref 30.0–36.0)
MCV: 87 fL (ref 78.0–100.0)
Monocytes Absolute: 0.4 10*3/uL (ref 0.1–1.0)
Monocytes Relative: 6 % (ref 3–12)
Neutro Abs: 3.2 10*3/uL (ref 1.7–7.7)
Neutrophils Relative %: 53 % (ref 43–77)
Platelets: 143 10*3/uL — ABNORMAL LOW (ref 150–400)
RBC: 4.23 MIL/uL (ref 3.87–5.11)
RDW: 12.5 % (ref 11.5–15.5)
WBC: 6 10*3/uL (ref 4.0–10.5)

## 2014-07-18 LAB — URINALYSIS, ROUTINE W REFLEX MICROSCOPIC
Bilirubin Urine: NEGATIVE
Glucose, UA: NEGATIVE mg/dL
Hgb urine dipstick: NEGATIVE
Ketones, ur: NEGATIVE mg/dL
Leukocytes, UA: NEGATIVE
Nitrite: NEGATIVE
Protein, ur: NEGATIVE mg/dL
Specific Gravity, Urine: 1.022 (ref 1.005–1.030)
Urobilinogen, UA: 0.2 mg/dL (ref 0.0–1.0)
pH: 5 (ref 5.0–8.0)

## 2014-07-18 LAB — BASIC METABOLIC PANEL
Anion gap: 11 (ref 5–15)
BUN: 16 mg/dL (ref 6–23)
CO2: 23 mmol/L (ref 19–32)
Calcium: 9.1 mg/dL (ref 8.4–10.5)
Chloride: 103 mmol/L (ref 96–112)
Creatinine, Ser: 0.71 mg/dL (ref 0.50–1.10)
GFR calc Af Amer: >90 mL/min (ref >90)
GFR calc non Af Amer: >90 mL/min (ref >90)
Glucose, Bld: 203 mg/dL — ABNORMAL HIGH (ref 70–99)
Potassium: 3.6 mmol/L (ref 3.5–5.1)
Sodium: 137 mmol/L (ref 135–145)

## 2014-07-18 LAB — PROTIME-INR
INR: 1.08 (ref 0.00–1.49)
Prothrombin Time: 14.2 seconds (ref 11.6–15.2)

## 2014-07-18 LAB — GLUCOSE, CAPILLARY
Glucose-Capillary: 188 mg/dL — ABNORMAL HIGH (ref 70–99)
Glucose-Capillary: 187 mg/dL — ABNORMAL HIGH (ref 70–99)

## 2014-07-18 LAB — APTT: aPTT: 33 seconds (ref 24–37)

## 2014-07-18 MED ORDER — MIDAZOLAM HCL 2 MG/2ML IJ SOLN
INTRAMUSCULAR | Status: AC | PRN
  Administered 2014-07-18: 0.5 mg via INTRAVENOUS

## 2014-07-18 MED ORDER — MIDAZOLAM HCL 2 MG/2ML IJ SOLN
INTRAMUSCULAR | Status: AC
  Filled 2014-07-18: qty 2

## 2014-07-18 MED ORDER — HEPARIN SODIUM (PORCINE) 1000 UNIT/ML IJ SOLN
INTRAMUSCULAR | Status: AC
  Filled 2014-07-18: qty 1

## 2014-07-18 MED ORDER — IOHEXOL 300 MG/ML  SOLN
150.0000 mL | Freq: Once | INTRAMUSCULAR | Status: AC | PRN
  Administered 2014-07-18: 70 mL via INTRA_ARTERIAL

## 2014-07-18 MED ORDER — SODIUM CHLORIDE 0.9 % IV SOLN
INTRAVENOUS | Status: DC
Start: 2014-07-18 — End: 2014-07-19
  Administered 2014-07-18: 07:00:00 via INTRAVENOUS

## 2014-07-18 MED ORDER — SODIUM CHLORIDE 0.9 % IV SOLN: INTRAVENOUS | Status: DC

## 2014-07-18 MED ORDER — HYDROCODONE-ACETAMINOPHEN 5-325 MG PO TABS
1.0000 | ORAL_TABLET | ORAL | Status: DC | PRN
Start: 2014-07-18 — End: 2014-07-19

## 2014-07-18 MED ORDER — IODIXANOL 320 MG/ML IV SOLN
100.0000 mL | Freq: Once | INTRAVENOUS | Status: AC | PRN
  Administered 2014-07-18: 30 mL via INTRAVENOUS

## 2014-07-18 MED ORDER — LIDOCAINE HCL 1 % IJ SOLN
INTRAMUSCULAR | Status: AC
  Filled 2014-07-18: qty 20

## 2014-07-18 MED ORDER — FENTANYL CITRATE 0.05 MG/ML IJ SOLN
INTRAMUSCULAR | Status: AC
  Administered 2014-07-18: 25 ug via INTRAVENOUS
  Filled 2014-07-18: qty 2

## 2014-07-18 MED ORDER — FENTANYL CITRATE 0.05 MG/ML IJ SOLN
INTRAMUSCULAR | Status: AC | PRN
  Administered 2014-07-18: 25 ug via INTRAVENOUS

## 2014-07-18 MED ORDER — HEPARIN SODIUM (PORCINE) 1000 UNIT/ML IJ SOLN
INTRAMUSCULAR | Status: AC | PRN
  Administered 2014-07-18: 2000 [IU] via INTRAVENOUS

## 2014-07-18 NOTE — Sedation Documentation (Addendum)
Actual sedation start was [id number]- RN had malfunction with charting- go under sedation timeline for access to sedation up to this point

## 2014-07-18 NOTE — Op Note (Signed)
DIAGNOSTIC CEREBRAL ANGIOGRAM    OPERATOR:   Dr. Lisbeth Renshaw, MD  HISTORY:   The patient is a 59 y.o. 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 The patient presents for further workup with diagnostic cerebral angiogram.  APPROACH:   The technical aspects of the procedure as well as its potential risks and benefits were reviewed with the patient. These risks included but were not limited bleeding, infection, allergic reaction, damage to organs/vital structures, stroke, non-diagnostic procedure, and the catastrophic outcomes of heart attack, coma, and death. With an understanding of these risks, informed consent was obtained and witnessed.    The patient was placed in the supine position on the angiography table and the skin of right groin prepped in the usual sterile fashion. The procedure was performed under local anesthesia (1%-solution of bicarbonate-bufferred Lidoacaine) and conscious sedation with Versed and fentanyl monitored by the in-suite nurse.    A 5- French sheath was introduced in the right common femoral artery using Seldinger technique.  A fluorophase sequence was used to document the sheath position.    HEPARIN: 2000 Units total.   CONTRAST AGENT: 130cc, Omnipaque 300   FLUOROSCOPY TIME: 16.2 combined AP and lateral minutes    CATHETER(S) AND WIRE(S):    5-French JB-1 glidecatheter   5 French Cook vertebral catheter 5 [place] Simmons-2 glide catheter 0.035" glidewire    VESSELS CATHETERIZED:   Right common carotid  Right internal carotid  Left common carotid   Right vertebral   Left vertebral   Right common femoral  VESSELS STUDIED:   Aortic arch: LAO Right common carotid: neck: AP and lateral   Right internal carotid: head: AP, lateral, obliques   Right vertebral: AP, lateral   Left common carotid: neck: AP and lateral   Left common carotid: head: AP, lateral,  obliques   Left vertebral: AP, lateral , transfacial, obliques Right femoral: RAO  PROCEDURAL NARRATIVE:   A 5-Fr pigtail catheter was advanced over a glidewire into the aortic arch and an aortogram was performed. The pigtail was removed by pin pull technique over the wire. A 5-Fr JB-1 terumo glide catheter was then advanced over a 0.035 glidewire into the aortic arch and the innominate and right common carotid artery, followed by the right internal carotid artery were selected. Cervical and cerebral angiography were performed. The JB-1 was then withdrawn into the innominate artery and the right subclavian artery followed by the right vertebral artery were selected. Cerebral angiography was performed. The JB-1 was then withdrawn into the aortic arch and the left common carotid artery was selected. Cervical and cerebral angiography were performed. The JB-1 catheter was then withdrawn into the aortic arch and attempt were made to catheterized the left vertebral artery unsuccessfully. The JB 1 catheter was then removed and unsuccessful attempts were made to catheterized the left vertebral artery with a cook vertebral catheter. This was then removed, and the Simmons to glide catheter was introduced over the Glidewire. Secondary curve was then reformed to the left subclavian artery, and the left vertebral artery was catheterized. Cerebral injury grams were taken. The Simmons-2 glide catheter was then removed without incident.   INTERPRETATION:   Aortic arch:    Type II, normal three vessel arch configuration. No significant ostial stenosis.   Right common carotid: neck:   There is no significant stenosis, occlusion, aneurysm or plaque visualized on this injection.    Right internal carotid: head:  Injection reveals the presence of a widely patent ICA, M1, and A1 segments and their branches. The anterior communicating artery is hypoplastic, and no significant filling of the distal anterior cerebral artery  territory seen. There is a proximally 5 mm x 4 mm aneurysm arising at the right middle cerebral artery bifurcation, projecting anteriorly and laterally. At least one branch of the middle cerebral artery appears to arise at the neck of the aneurysm.  The parenchymal and venous phases are normal. The venous sinuses are widely patent.    Left common carotid: neck:   There is no significant stenosis, occlusion, aneurysm or plaque visualized on this injection.    Left common carotid: head:   Injection reveals the presence of a widely patent ICA, A1, and M1 segments and their branches. There is no significant stenosis, occlusion, aneurysm, or high flow vascular malformation visualized. Incidental note is made of an azygous A2. The parenchymal and venous phases are normal. The venous sinuses are widely patent.    Left vertebral:   Injection reveals the presence of a widely patent vertebral artery. This leads to a widely patent basilar artery that terminates in bilateral P1. There is an aneurysm at the basilar apex measuring approximately 12 mm x 9 mm. The neck of this aneurysm measures approximately 4 mm. This projects superiorly. The parenchymal and venous phases are normal. The venous sinuses are widely patent.    Right vertebral:    Normal, nondominant vessel. No PICA aneurysm. See basilar description above.    Right femoral:    Normal vessel. No significant atherosclerotic disease. Arterial sheath in adequate position.   DISPOSITION:  Upon completion of the study, the femoral sheath was removed and hemostasis obtained using a 5-Fr ExoSeal closure device. Good proximal and distal lower extremity pulses were documented upon achievement of hemostasis.    The procedure was well tolerated and no early complications were observed.       The patient was transferred back to the holding area to be positioned flat in bed for 3 hours of observation.    IMPRESSION:  1. 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 2. 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  The preliminary results of this procedure were shared with the patient and the patient's family.

## 2014-07-18 NOTE — Sedation Documentation (Addendum)
First part of sedation can be found under sedation timeline; error with computer- have to go under timeline to view sedation. Sedation and procedure [facility] at [facility]

## 2014-07-18 NOTE — H&P (Signed)
CC:  Aneurysm  HPI: [name] is a 59 year old woman seen for initial consultation in the office.  She comes in after suffering a fall a few weeks ago while walking down the stairs.  She says she missed a stair and fell on her left side.  She went any [facility], where CT scan of the head was done demonstrating the possibility of an aneurysm.  This was further examined with CT angiogram.  Right sided middle cerebral artery, and a basilar apex aneurysm were identified, and she was referred for further neurosurgical evaluation.  Upon questioning, the patient states that she does occasionally have headaches, especially after this fall, as well as pain on the left side of her body from the fall itself.  She does not have any visual changes, numbness, tingling, or weakness.  Of note, the patient is a tobacco smoker, of approximately 4-5 cigarettes per day, has a history of hypertension although she states it is well-controlled with medications and does receive regular follow-up, and she does have a family history of aneurysms in her grandmother apparently.     PMH: Past Medical History  Diagnosis Date  . Type 2 diabetes mellitus   . Essential hypertension, benign   . Mixed hyperlipidemia   . Diabetes mellitus     PSH: Past Surgical History  Procedure Laterality Date  . Umbilical hernia repair  2008  . Bilateral tubal ligation      SH: History  Substance Use Topics  . Smoking status: Current Every Day Smoker -- 1.00 packs/day    Types: Cigarettes  . Smokeless tobacco: Never Used  . Alcohol Use: No    MEDS: Prior to Admission medications   Medication Sig Start Date End Date Taking? Authorizing Provider  insulin aspart (NOVOLOG) 100 UNIT/ML FlexPen Inject 1-1.5 Units into the skin 4 (four) times daily as needed for high blood sugar.   Yes Historical Provider, MD  Insulin Glargine (LANTUS) 100 UNIT/ML Solostar Pen Inject 20 Units into the skin daily.   Yes Historical Provider, MD   metFORMIN (GLUCOPHAGE) 500 MG tablet Take 1,000 mg by mouth daily.    Yes Historical Provider, MD  olmesartan (BENICAR) 20 MG tablet Take 20 mg by mouth daily.   Yes Historical Provider, MD  rosuvastatin (CRESTOR) 20 MG tablet Take 20 mg by mouth daily.   Yes Historical Provider, MD  HYDROcodone-acetaminophen (NORCO/VICODIN) 5-325 MG per tablet 1 or 2 tabs PO q6 hours prn pain Patient not taking: Reported on 07/17/2014 06/09/14   Samuel JesterKathleen McManus, DO    ALLERGY: No Known Allergies  ROS: ROS  NEUROLOGIC EXAM: Awake, alert, oriented Memory and concentration grossly intact Speech fluent, appropriate CN grossly intact Motor exam: Upper Extremities Deltoid Bicep Tricep Grip  Right 5/5 5/5 5/5 5/5  Left 5/5 5/5 5/5 5/5   Lower Extremity IP Quad PF DF EHL  Right 5/5 5/5 5/5 5/5 5/5  Left 5/5 5/5 5/5 5/5 5/5   Sensation grossly intact to LT  [facility] CT angiogram of the brain was reviewed.  This demonstrates a right-sided middle cerebral artery aneurysm measuring approximately 5 x 4 mm, at the MCA bifurcation.  This projects laterally and anteriorly.  There is also a approximately 9 x 11 mm basilar apex aneurysm.  IMPRESSION: 59 year old woman with incidentally discovered large basilar apex aneurysm, and a smaller right middle cerebral artery aneurysm.  Given the patient's age, and the size of these aneurysms, these would warrant treatment if possible.  PLAN: - Proceed with diagnostic  cerebral angiogram - Likely home post-procedure  A comprehensive discussion was had with the patient and her sister totaling approximately 30 minutes in the office. The natural history of cerebral aneurysms was reviewed in detail, with around 5% per year risk of rupture. The general treatment options were also discussed, with the need for further characterization by catheter angiogram. The risks of the angiogram procedure were reviewed, including a 0.1% risk of stroke, and overal risk of approximately 1%  including but not limited to groin hematoma, headache, contrast reaction, and nephropathy.  The patient and her sister understood our discussion and are willing to proceed as above.

## 2014-07-18 NOTE — Discharge Instructions (Signed)

## 2014-07-18 NOTE — Sedation Documentation (Signed)
IR tech placing exoseal to R groin, will also hold pressure 10 min.

## 2014-07-18 NOTE — Progress Notes (Signed)
D/c home with boyfriend and sister; no c/o; right groin Exoseal dressing CDI without bleed/hematoma.

## 2014-07-18 NOTE — Sedation Documentation (Signed)
Pedal pulses pre-procedure DP 1t, PT 2t

## 2014-07-18 NOTE — Sedation Documentation (Addendum)
Report to Harriett SineNancy, RN short stay

## 2014-08-01 ENCOUNTER — Other Ambulatory Visit (HOSPITAL_COMMUNITY): Payer: Self-pay | Admitting: Neurosurgery

## 2014-08-02 ENCOUNTER — Other Ambulatory Visit (HOSPITAL_COMMUNITY): Payer: Self-pay | Admitting: Neurosurgery

## 2014-08-02 DIAGNOSIS — I729 Aneurysm of unspecified site: Secondary | ICD-10-CM

## 2014-08-13 ENCOUNTER — Emergency Department (HOSPITAL_COMMUNITY)
Admission: EM | Admit: 2014-08-13 | Discharge: 2014-08-13 | Disposition: A | Discharge status: Home / Self Care | Payer: Disability Insurance | Attending: Emergency Medicine | Admitting: Emergency Medicine

## 2014-08-13 ENCOUNTER — Encounter (HOSPITAL_COMMUNITY): Payer: Self-pay

## 2014-08-13 DIAGNOSIS — I10 Essential (primary) hypertension: Secondary | ICD-10-CM | POA: Insufficient documentation

## 2014-08-13 DIAGNOSIS — Z8781 Personal history of (healed) traumatic fracture: Secondary | ICD-10-CM | POA: Insufficient documentation

## 2014-08-13 DIAGNOSIS — M79642 Pain in left hand: Secondary | ICD-10-CM | POA: Insufficient documentation

## 2014-08-13 DIAGNOSIS — Z79899 Other long term (current) drug therapy: Secondary | ICD-10-CM | POA: Insufficient documentation

## 2014-08-13 DIAGNOSIS — E782 Mixed hyperlipidemia: Secondary | ICD-10-CM | POA: Insufficient documentation

## 2014-08-13 DIAGNOSIS — E119 Type 2 diabetes mellitus without complications: Secondary | ICD-10-CM | POA: Insufficient documentation

## 2014-08-13 DIAGNOSIS — Z720 Tobacco use: Secondary | ICD-10-CM | POA: Insufficient documentation

## 2014-08-13 DIAGNOSIS — Z794 Long term (current) use of insulin: Secondary | ICD-10-CM | POA: Insufficient documentation

## 2014-08-13 MED ORDER — IBUPROFEN 800 MG PO TABS
800.0000 mg | ORAL_TABLET | Freq: Once | ORAL | Status: AC
  Administered 2014-08-13: 800 mg via ORAL
  Filled 2014-08-13: qty 1

## 2014-08-13 MED ORDER — NAPROXEN 500 MG PO TABS: 500.0000 mg | ORAL_TABLET | Freq: Two times a day (BID) | ORAL | Status: DC

## 2014-08-13 MED ORDER — HYDROCODONE-ACETAMINOPHEN 5-325 MG PO TABS
1.0000 | ORAL_TABLET | Freq: Once | ORAL | Status: AC
Start: 2014-08-13 — End: 2014-08-13
  Administered 2014-08-13: 1 via ORAL
  Filled 2014-08-13: qty 1

## 2014-08-13 MED ORDER — HYDROCODONE-ACETAMINOPHEN 5-325 MG PO TABS: ORAL_TABLET | ORAL | Status: DC

## 2014-08-13 NOTE — ED Notes (Signed)
Having pain in left hand from a previous fall per pt. Left hand swelling and have knots in the palm of my hand.

## 2014-08-13 NOTE — Discharge Instructions (Signed)
Cryotherapy Cryotherapy is when you put ice on your injury. Ice helps lessen pain and puffiness (swelling) after an injury. Ice works the best when you start using it in the first 24 to 48 hours after an injury. HOME CARE  Put a dry or damp towel between the ice pack and your skin.  You may press gently on the ice pack.  Leave the ice on for no more than 10 to 20 minutes at a time.  Check your skin after 5 minutes to make sure your skin is okay.  Rest at least 20 minutes between ice pack uses.  Stop using ice when your skin loses feeling (numbness).  Do not use ice on someone who cannot tell you when it hurts. This includes small children and people with memory problems (dementia). GET HELP RIGHT AWAY IF:  You have white spots on your skin.  Your skin turns blue or pale.  Your skin feels waxy or hard.  Your puffiness gets worse. MAKE SURE YOU:   Understand these instructions.  Will watch your condition.  Will get help right away if you are not doing well or get worse. Document Released: 11/26/2007 Document Revised: 09/01/2011 Document Reviewed: 01/30/2011 ExitCare Patient Information 2015 [employer]. This information is not intended to replace advice given to you by your health care provider. Make sure you discuss any questions you have with your health care provider.  

## 2014-08-13 NOTE — ED Provider Notes (Signed)
CSN: [id number]     Arrival date & time 08/13/14  2032 History   First MD Initiated Contact with Patient 08/13/14 2101     Chief Complaint  Patient presents with  . Hand Pain     (Consider location/radiation/quality/duration/timing/severity/associated sxs/prior Treatment) HPI  [name] is a 59 y.o. female who presents to the Emergency Department complaining of persistent left hand pain.  She reports a fall in December that resulted in a fracture to her left third finger, but she states she did not follow-up with orthopedics.  She now complains of pain to the all the fingers of the left hand excluding the thumb and reports having "knots" in her palm.  She states her fingers are swollen and she is having difficulty gripping or making a fist.  She denies numbness, fever, wrist pain or additional injuries.  She has been taking tylenol without relief.    Past Medical History  Diagnosis Date  . Type 2 diabetes mellitus   . Essential hypertension, benign   . Mixed hyperlipidemia   . Diabetes mellitus    Past Surgical History  Procedure Laterality Date  . Umbilical hernia repair  2008  . Bilateral tubal ligation     Family History  Problem Relation Age of Onset  . Cancer Father   . COPD Mother    History  Substance Use Topics  . Smoking status: Current Every Day Smoker -- 1.00 packs/day    Types: Cigarettes  . Smokeless tobacco: Never Used  . Alcohol Use: No   OB History    No data available     Review of Systems  Constitutional: Negative for fever and chills.  Musculoskeletal: Positive for arthralgias (left hand pain).  Skin: Negative for color change and wound.  Neurological: Negative for dizziness, weakness and numbness.  All other systems reviewed and are negative.     Allergies  Review of patient's allergies indicates no known allergies.  Home Medications   Prior to Admission medications   Medication Sig Start Date End Date Taking? Authorizing Provider   HYDROcodone-acetaminophen (NORCO/VICODIN) 5-325 MG per tablet 1 or 2 tabs PO q6 hours prn pain Patient not taking: Reported on 07/17/2014 06/09/14   Samuel Jester, DO  insulin aspart (NOVOLOG) 100 UNIT/ML FlexPen Inject 1-1.5 Units into the skin 4 (four) times daily as needed for high blood sugar.    Historical Provider, MD  Insulin Glargine (LANTUS) 100 UNIT/ML Solostar Pen Inject 20 Units into the skin daily.    Historical Provider, MD  metFORMIN (GLUCOPHAGE) 500 MG tablet Take 1,000 mg by mouth daily.     Historical Provider, MD  olmesartan (BENICAR) 20 MG tablet Take 20 mg by mouth daily.    Historical Provider, MD  rosuvastatin (CRESTOR) 20 MG tablet Take 20 mg by mouth daily.    Historical Provider, MD   BP 113/78 mmHg  Pulse 82  Temp(Src) 98.2 F (36.8 C) (Oral)  Resp 20  Ht  (1.626 m)  Wt 216 lb 9.6 oz (98.249 kg)  BMI 37.16 kg/m2  SpO2 98% Physical Exam  Constitutional: She is oriented to person, place, and time. She appears well-developed and well-nourished. No distress.  Cardiovascular: Normal rate, regular rhythm, normal heart sounds and intact distal pulses.   No murmur heard. Pulmonary/Chest: Effort normal and breath sounds normal. No respiratory distress.  Musculoskeletal: She exhibits edema and tenderness.  Mild STS of the second through the fifth fingers.  Patient has small, palpable nodules along the palmar aspect  of the proximal fingers.  No open wounds or erythema.  Radial pulse and distal sensation intact  Neurological: She is alert and oriented to person, place, and time. She exhibits normal muscle tone. Coordination normal.  Skin: Skin is warm. No rash noted. No erythema.  Nursing note and vitals reviewed.   ED Course  Procedures (including critical care time) Labs Review Labs Reviewed - No data to display  Imaging Review No results found.   EKG Interpretation None      MDM   Final diagnoses:  Hand pain, left   I have reviewed patient's  xray from December.  Patient seen here 06/09/14 for same.  Had a avulsion fx of the third finger at that time.  Pt has not followed up with anyone.  Now likely has Dupuytren's contractures.  NV intact.  Pt advised that she will need to arrange further follow-up with orthopedics. She verbalizes understanding and agrees to plan. Appears stable for d/c     Amadou Katzenstein L. Rowe Robertriplett, PA-C 08/14/14 2055  Benny LennertJoseph L Zammit, MD 08/15/14 [phone]

## 2014-08-14 ENCOUNTER — Telehealth: Payer: Self-pay | Admitting: Orthopedic Surgery

## 2014-08-14 NOTE — Telephone Encounter (Signed)
I can see her but do not overbook me so put her on an appropriate day with an appropriate opening

## 2014-08-14 NOTE — Telephone Encounter (Signed)
Patient is aware that she can be seen by Dr. Romeo AppleHarrison, However [name] states that the patient does NOT have [facility] at this time,Patient has been made aware of this and she will need to get this issue resolved before she can make an appointment.

## 2014-08-14 NOTE — Telephone Encounter (Signed)
Patient called asking for an appointment, she states she was in the ER for left hand pain and swelling, no xrays were taken on 08/13/14, we are following up, the patient states that she is 100% [name] assistance I have sent [name] a message for that verification. Is this something that can be handled by you Dr. Romeo AppleHarrison or does she need to follow up with someone else?

## 2014-08-16 ENCOUNTER — Emergency Department (HOSPITAL_COMMUNITY)
Admission: EM | Admit: 2014-08-16 | Discharge: 2014-08-17 | Disposition: A | Discharge status: Home / Self Care | Payer: Disability Insurance | Attending: Emergency Medicine | Admitting: Emergency Medicine

## 2014-08-16 ENCOUNTER — Encounter (HOSPITAL_COMMUNITY): Payer: Self-pay

## 2014-08-16 DIAGNOSIS — M79642 Pain in left hand: Secondary | ICD-10-CM

## 2014-08-16 DIAGNOSIS — I10 Essential (primary) hypertension: Secondary | ICD-10-CM | POA: Insufficient documentation

## 2014-08-16 DIAGNOSIS — S62603G Fracture of unspecified phalanx of left middle finger, subsequent encounter for fracture with delayed healing: Secondary | ICD-10-CM

## 2014-08-16 DIAGNOSIS — Z79899 Other long term (current) drug therapy: Secondary | ICD-10-CM | POA: Insufficient documentation

## 2014-08-16 DIAGNOSIS — Z794 Long term (current) use of insulin: Secondary | ICD-10-CM | POA: Insufficient documentation

## 2014-08-16 DIAGNOSIS — S62635G Displaced fracture of distal phalanx of left ring finger, subsequent encounter for fracture with delayed healing: Secondary | ICD-10-CM | POA: Insufficient documentation

## 2014-08-16 DIAGNOSIS — S62625G Displaced fracture of medial phalanx of left ring finger, subsequent encounter for fracture with delayed healing: Secondary | ICD-10-CM

## 2014-08-16 DIAGNOSIS — W1839XD Other fall on same level, subsequent encounter: Secondary | ICD-10-CM | POA: Insufficient documentation

## 2014-08-16 DIAGNOSIS — E782 Mixed hyperlipidemia: Secondary | ICD-10-CM | POA: Insufficient documentation

## 2014-08-16 DIAGNOSIS — Z720 Tobacco use: Secondary | ICD-10-CM | POA: Insufficient documentation

## 2014-08-16 DIAGNOSIS — IMO0001 Reserved for inherently not codable concepts without codable children: Secondary | ICD-10-CM

## 2014-08-16 DIAGNOSIS — R52 Pain, unspecified: Secondary | ICD-10-CM

## 2014-08-16 DIAGNOSIS — E119 Type 2 diabetes mellitus without complications: Secondary | ICD-10-CM | POA: Insufficient documentation

## 2014-08-16 DIAGNOSIS — S62633G Displaced fracture of distal phalanx of left middle finger, subsequent encounter for fracture with delayed healing: Secondary | ICD-10-CM | POA: Insufficient documentation

## 2014-08-16 DIAGNOSIS — M533 Sacrococcygeal disorders, not elsewhere classified: Secondary | ICD-10-CM

## 2014-08-16 NOTE — ED Notes (Signed)
Pt reports pain in left hand and left hip/lower back x 3 days.

## 2014-08-16 NOTE — ED Provider Notes (Signed)
CSN: [id number]     Arrival date & time 08/16/14  2337 History  This chart was scribed for Dione Booze, MD by Annye Asa, ED Scribe. This patient was seen in room APA08/APA08 and the patient's care was started at 12:41 AM.    Chief Complaint  Patient presents with  . Hand Pain  . Hip Pain   The history is provided by the patient. No language interpreter was used.     HPI Comments: [name] is a 59 y.o. female with past medical history of DM II, HTN, HLD who presents to the Emergency Department complaining of persistent left hand pain and left hip and lower back pain for 2 months after a fall which resulted in a fracture of her left third finger. She has been seen here twice for her hand pain; she is "unable to close" her hand. She also reports worsening left-sided back pain, this pain increases with movement of her left leg. She rates her pain as an 8/10. She was given pain medications on her last visit and reports that they have not improved her pain. She came to the ED tonight because her pain continues. She reports that she has not followed up with orthopedics due to a lack of insurance; "Dr. Romeo Apple won't take me because I don't have insurance."  Past Medical History  Diagnosis Date  . Type 2 diabetes mellitus   . Essential hypertension, benign   . Mixed hyperlipidemia   . Diabetes mellitus    Past Surgical History  Procedure Laterality Date  . Umbilical hernia repair  2008  . Bilateral tubal ligation     Family History  Problem Relation Age of Onset  . Cancer Father   . COPD Mother    History  Substance Use Topics  . Smoking status: Current Every Day Smoker -- 1.00 packs/day    Types: Cigarettes  . Smokeless tobacco: Never Used  . Alcohol Use: No   OB History    No data available     Review of Systems  Musculoskeletal: Positive for back pain.       Left hand pain  All other systems reviewed and are negative.  Allergies  Review of patient's allergies  indicates no known allergies.  Home Medications   Prior to Admission medications   Medication Sig Start Date End Date Taking? Authorizing Provider  HYDROcodone-acetaminophen (NORCO/VICODIN) 5-325 MG per tablet Take one-two tabs po q 4-6 hrs prn pain 08/13/14   Tammy L. Triplett, PA-C  insulin aspart (NOVOLOG) 100 UNIT/ML FlexPen Inject 1-1.5 Units into the skin 4 (four) times daily as needed for high blood sugar.    Historical Provider, MD  Insulin Glargine (LANTUS) 100 UNIT/ML Solostar Pen Inject 20 Units into the skin daily.    Historical Provider, MD  metFORMIN (GLUCOPHAGE) 500 MG tablet Take 1,000 mg by mouth daily.     Historical Provider, MD  naproxen (NAPROSYN) 500 MG tablet Take 1 tablet (500 mg total) by mouth 2 (two) times daily with a meal. 08/13/14   Tammy L. Triplett, PA-C  olmesartan (BENICAR) 20 MG tablet Take 20 mg by mouth daily.    Historical Provider, MD  rosuvastatin (CRESTOR) 20 MG tablet Take 20 mg by mouth daily.    Historical Provider, MD   BP 128/64 mmHg  Pulse 77  Temp(Src) 98.9 F (37.2 C) (Oral)  Resp 18  Ht  (1.626 m)  Wt 200 lb (90.719 kg)  BMI 34.31 kg/m2  SpO2 97% Physical  Exam  Constitutional: She is oriented to person, place, and time. She appears well-developed and well-nourished.  Non-toxic appearance. No distress.  HENT:  Head: Normocephalic and atraumatic.  Eyes: Conjunctivae, EOM and lids are normal. Pupils are equal, round, and reactive to light.  Neck: Normal range of motion. Neck supple. No JVD present. No thyroid mass present.  Cardiovascular: Normal rate, regular rhythm and normal heart sounds.  Exam reveals no gallop.   No murmur heard. Pulmonary/Chest: Effort normal and breath sounds normal. She has no decreased breath sounds. She has no wheezes. She has no rhonchi. She has no rales.  Abdominal: Soft. Normal appearance and bowel sounds are normal. She exhibits no distension and no mass. There is no tenderness. There is no CVA  tenderness.  Musculoskeletal: Normal range of motion. She exhibits tenderness. She exhibits no edema.  Moderate tenderness to the left sacral iliac area. Left hand has mild swelling diffusely; flexion contractures of second through fifth fingers, pain with passive flexion. Some nodules palpable of her flexor tendons of the second, third, fourth fingers of the left hand.  Lymphadenopathy:    She has no cervical adenopathy.  Neurological: She is alert and oriented to person, place, and time. She has normal strength and normal reflexes. No cranial nerve deficit or sensory deficit. Coordination normal. GCS eye subscore is 4. GCS verbal subscore is 5. GCS motor subscore is 6.  Skin: Skin is warm and dry. No abrasion and no rash noted.  Psychiatric: She has a normal mood and affect. Her speech is normal and behavior is normal. Thought content normal.  Nursing note and vitals reviewed.   ED Course  Procedures   DIAGNOSTIC STUDIES: Oxygen Saturation is 98% on RA, normal by my interpretation.    COORDINATION OF CARE: 12:54 AM Discussed treatment plan with pt at bedside and pt agreed to plan.  Imaging Review Dg Pelvis 1-2 Views  08/17/2014   CLINICAL DATA:  Left pelvic/buttock pain after fall 2 months prior.  EXAM: PELVIS - 1-2 VIEW  COMPARISON:  None.  FINDINGS: The cortical margins of the bony pelvis are intact. No fracture. Pubic symphysis and sacroiliac joints are congruent. Both femoral heads are well-seated in the respective acetabula. No bony destructive lesion. There is mild degenerative change of both hips. Lateral aspect of the left greater trochanter, partially excluded from the field of view.  IMPRESSION: Mild degenerative change of both hips without acute bony abnormality.   Electronically Signed   By: Rubye OaksMelanie  Ehinger M.D.   On: 08/17/2014 01:28   Dg Hand Complete Left  08/17/2014   CLINICAL DATA:  Left hand pain after a fall 2 months ago. Patient has been seen in the ED 3 times since  the fall in this had no relief. Increasing pain over the last 3 days.  EXAM: LEFT HAND - COMPLETE 3+ VIEW  COMPARISON:  06/09/2014  FINDINGS: Volar plate fractures of the middle phalanges of the third and fourth fingers. Possibly also of the second finger. Changes appear to been present on previous study. Mild soft tissue swelling. Degenerative changes in the interphalangeal joints. No acute fracture or dislocation identified.  IMPRESSION: Volar plate fractures of the middle phalanges of the third and fourth fingers and probably also of the second finger. Degenerative changes. No new fractures identified.   Electronically Signed   By: Burman NievesWilliam  Stevens M.D.   On: 08/17/2014 01:27   Images viewed by me.   MDM   Final diagnoses:  Pain  Pain of  left hand  Closed fracture of phalanx of third finger of left hand, with delayed healing, subsequent encounter  Closed fracture of middle phalanx of fourth finger of left hand, with delayed healing, subsequent encounter  Pain of left sacroiliac joint    Pain in left hand secondary to injury 2 months ago. She did not have follow through with orthopedic referral. She appears to have developed some contracture, possibly Dupuytren's contracture. X-rays of the hand are repeated showing volar plate fractures unchanged from x-rays from December. Old records were reviewed confirming recent ED visit for ongoing pain. She had been given hydrocodone-acetaminophen which did not give her adequate pain relief. She will be given a trial of oxycodone-acetaminophen. She is referred to hand surgery for follow-up. Was explained to patient that the best time for treatment had passed and she may have some residual problems with pain and/or restricted movement of the fingers of her left hand.   I personally performed the services described in this documentation, which was scribed in my presence. The recorded information has been reviewed and is accurate.       Dione Booze,  MD 08/17/14 [phone]

## 2014-08-17 ENCOUNTER — Emergency Department (HOSPITAL_COMMUNITY): Payer: Disability Insurance

## 2014-08-17 MED ORDER — OXYCODONE-ACETAMINOPHEN 5-325 MG PO TABS: 1.0000 | ORAL_TABLET | ORAL | Status: DC | PRN

## 2014-08-17 MED ORDER — OXYCODONE-ACETAMINOPHEN 5-325 MG PO TABS
1.0000 | ORAL_TABLET | Freq: Once | ORAL | Status: AC
Start: 2014-08-17 — End: 2014-08-17
  Administered 2014-08-17: 1 via ORAL
  Filled 2014-08-17: qty 1

## 2014-08-17 NOTE — Discharge Instructions (Signed)
Continue taking Naproxen. Follow up with the hand specialist.  Acetaminophen; Oxycodone tablets What is this medicine? ACETAMINOPHEN; OXYCODONE (a set a MEE noe fen; ox i KOE done) is a pain reliever. It is used to treat mild to moderate pain. This medicine may be used for other purposes; ask your health care provider or pharmacist if you have questions. COMMON BRAND NAME(S): Endocet, Magnacet, Narvox, Percocet, Perloxx, Primalev, Primlev, Roxicet, Xolox What should I tell my health care provider before I take this medicine? They need to know if you have any of these conditions: -brain tumor -Crohn's disease, inflammatory bowel disease, or ulcerative colitis -drug abuse or addiction -head injury -heart or circulation problems -if you often drink alcohol -kidney disease or problems going to the bathroom -liver disease -lung disease, asthma, or breathing problems -an unusual or allergic reaction to acetaminophen, oxycodone, other opioid analgesics, other medicines, foods, dyes, or preservatives -pregnant or trying to get pregnant -breast-feeding How should I use this medicine? Take this medicine by mouth with a full glass of water. Follow the directions on the prescription label. Take your medicine at regular intervals. Do not take your medicine more often than directed. Talk to your pediatrician regarding the use of this medicine in children. Special care may be needed. Patients over 58 years old may have a stronger reaction and need a smaller dose. Overdosage: If you think you have taken too much of this medicine contact a poison control center or emergency room at once. NOTE: This medicine is only for you. Do not share this medicine with others. What if I miss a dose? If you miss a dose, take it as soon as you can. If it is almost time for your next dose, take only that dose. Do not take double or extra doses. What may interact with this  medicine? -alcohol -antihistamines -barbiturates like amobarbital, butalbital, butabarbital, methohexital, pentobarbital, phenobarbital, thiopental, and secobarbital -benztropine -drugs for bladder problems like solifenacin, trospium, oxybutynin, tolterodine, hyoscyamine, and methscopolamine -drugs for breathing problems like ipratropium and tiotropium -drugs for certain stomach or intestine problems like propantheline, homatropine methylbromide, glycopyrrolate, atropine, belladonna, and dicyclomine -general anesthetics like etomidate, ketamine, nitrous oxide, propofol, desflurane, enflurane, halothane, isoflurane, and sevoflurane -medicines for depression, anxiety, or psychotic disturbances -medicines for sleep -muscle relaxants -naltrexone -narcotic medicines (opiates) for pain -phenothiazines like perphenazine, thioridazine, chlorpromazine, mesoridazine, fluphenazine, prochlorperazine, promazine, and trifluoperazine -scopolamine -tramadol -trihexyphenidyl This list may not describe all possible interactions. Give your health care provider a list of all the medicines, herbs, non-prescription drugs, or dietary supplements you use. Also tell them if you smoke, drink alcohol, or use illegal drugs. Some items may interact with your medicine. What should I watch for while using this medicine? Tell your doctor or health care professional if your pain does not go away, if it gets worse, or if you have new or a different type of pain. You may develop tolerance to the medicine. Tolerance means that you will need a higher dose of the medication for pain relief. Tolerance is normal and is expected if you take this medicine for a long time. Do not suddenly stop taking your medicine because you may develop a severe reaction. Your body becomes used to the medicine. This does NOT mean you are addicted. Addiction is a behavior related to getting and using a drug for a non-medical reason. If you have pain, you  have a medical reason to take pain medicine. Your doctor will tell you how much medicine to take. If your  doctor wants you to stop the medicine, the dose will be slowly lowered over time to avoid any side effects. You may get drowsy or dizzy. Do not drive, use machinery, or do anything that needs mental alertness until you know how this medicine affects you. Do not stand or sit up quickly, especially if you are an older patient. This reduces the risk of dizzy or fainting spells. Alcohol may interfere with the effect of this medicine. Avoid alcoholic drinks. There are different types of narcotic medicines (opiates) for pain. If you take more than one type at the same time, you may have more side effects. Give your health care provider a list of all medicines you use. Your doctor will tell you how much medicine to take. Do not take more medicine than directed. Call emergency for help if you have problems breathing. The medicine will cause constipation. Try to have a bowel movement at least every 2 to 3 days. If you do not have a bowel movement for 3 days, call your doctor or health care professional. Do not take Tylenol (acetaminophen) or medicines that have acetaminophen with this medicine. Too much acetaminophen can be very dangerous. Many nonprescription medicines contain acetaminophen. Always read the labels carefully to avoid taking more acetaminophen. What side effects may I notice from receiving this medicine? Side effects that you should report to your doctor or health care professional as soon as possible: -allergic reactions like skin rash, itching or hives, swelling of the face, lips, or tongue -breathing difficulties, wheezing -confusion -light headedness or fainting spells -severe stomach pain -unusually weak or tired -yellowing of the skin or the whites of the eyes Side effects that usually do not require medical attention (report to your doctor or health care professional if they continue  or are bothersome): -dizziness -drowsiness -nausea -vomiting This list may not describe all possible side effects. Call your doctor for medical advice about side effects. You may report side effects to FDA at 1-800-FDA-1088. Where should I keep my medicine? Keep out of the reach of children. This medicine can be abused. Keep your medicine in a safe place to protect it from theft. Do not share this medicine with anyone. Selling or giving away this medicine is dangerous and against the law. Store at room temperature between 20 and 25 degrees C (68 and 77 degrees F). Keep container tightly closed. Protect from light. This medicine may cause accidental overdose and death if it is taken by other adults, children, or pets. Flush any unused medicine down the toilet to reduce the chance of harm. Do not use the medicine after the expiration date. NOTE: This sheet is a summary. It may not cover all possible information. If you have questions about this medicine, talk to your doctor, pharmacist, or health care provider.  2015, [employer]vier/Gold Standard. (2013-01-31 13:17:35)

## 2014-08-21 MED FILL — Oxycodone w/ Acetaminophen Tab 5-325 MG: ORAL | Qty: 6 | Status: AC

## 2014-08-30 ENCOUNTER — Ambulatory Visit (HOSPITAL_COMMUNITY)
Admission: RE | Admit: 2014-08-30 | Discharge: 2014-08-30 | Disposition: A | Discharge status: Home / Self Care | Payer: Disability Insurance | Source: Ambulatory Visit | Attending: Neurosurgery | Admitting: Neurosurgery

## 2014-08-30 ENCOUNTER — Encounter (HOSPITAL_COMMUNITY): Payer: Self-pay

## 2014-08-30 ENCOUNTER — Encounter (HOSPITAL_COMMUNITY)
Admission: RE | Admit: 2014-08-30 | Discharge: 2014-08-30 | Disposition: A | Discharge status: Home / Self Care | Payer: Self-pay | Source: Ambulatory Visit | Attending: Neurosurgery | Admitting: Neurosurgery

## 2014-08-30 DIAGNOSIS — F22 Delusional disorders: Secondary | ICD-10-CM

## 2014-08-30 DIAGNOSIS — Z01818 Encounter for other preprocedural examination: Secondary | ICD-10-CM | POA: Insufficient documentation

## 2014-08-30 DIAGNOSIS — Z01812 Encounter for preprocedural laboratory examination: Secondary | ICD-10-CM | POA: Insufficient documentation

## 2014-08-30 DIAGNOSIS — F17210 Nicotine dependence, cigarettes, uncomplicated: Secondary | ICD-10-CM | POA: Insufficient documentation

## 2014-08-30 DIAGNOSIS — I10 Essential (primary) hypertension: Secondary | ICD-10-CM | POA: Insufficient documentation

## 2014-08-30 HISTORY — DX: Depression, unspecified: F32.A

## 2014-08-30 HISTORY — DX: Major depressive disorder, single episode, unspecified: F32.9

## 2014-08-30 HISTORY — DX: Unspecified osteoarthritis, unspecified site: M19.90

## 2014-08-30 LAB — APTT: aPTT: 33 seconds (ref 24–37)

## 2014-08-30 LAB — BASIC METABOLIC PANEL
Anion gap: 8 (ref 5–15)
BUN: 15 mg/dL (ref 6–23)
CO2: 26 mmol/L (ref 19–32)
Calcium: 9.2 mg/dL (ref 8.4–10.5)
Chloride: 100 mmol/L (ref 96–112)
Creatinine, Ser: 1 mg/dL (ref 0.50–1.10)
GFR calc Af Amer: 71 mL/min — ABNORMAL LOW (ref >90)
GFR calc non Af Amer: 61 mL/min — ABNORMAL LOW (ref >90)
Glucose, Bld: 373 mg/dL — ABNORMAL HIGH (ref 70–99)
Potassium: 4 mmol/L (ref 3.5–5.1)
Sodium: 134 mmol/L — ABNORMAL LOW (ref 135–145)

## 2014-08-30 LAB — CBC WITH DIFFERENTIAL/PLATELET
Basophils Absolute: 0.1 10*3/uL (ref 0.0–0.1)
Basophils Relative: 1 % (ref 0–1)
Eosinophils Absolute: 0.2 10*3/uL (ref 0.0–0.7)
Eosinophils Relative: 3 % (ref 0–5)
HCT: 38.7 % (ref 36.0–46.0)
Hemoglobin: 13.3 g/dL (ref 12.0–15.0)
Lymphocytes Relative: 29 % (ref 12–46)
Lymphs Abs: 2.2 10*3/uL (ref 0.7–4.0)
MCH: 29.8 pg (ref 26.0–34.0)
MCHC: 34.4 g/dL (ref 30.0–36.0)
MCV: 86.6 fL (ref 78.0–100.0)
Monocytes Absolute: 0.5 10*3/uL (ref 0.1–1.0)
Monocytes Relative: 6 % (ref 3–12)
Neutro Abs: 4.6 10*3/uL (ref 1.7–7.7)
Neutrophils Relative %: 61 % (ref 43–77)
Platelets: 158 10*3/uL (ref 150–400)
RBC: 4.47 MIL/uL (ref 3.87–5.11)
RDW: 12.7 % (ref 11.5–15.5)
WBC: 7.5 10*3/uL (ref 4.0–10.5)

## 2014-08-30 LAB — PROTIME-INR
INR: 1.04 (ref 0.00–1.49)
Prothrombin Time: 13.7 seconds (ref 11.6–15.2)

## 2014-08-30 NOTE — Progress Notes (Signed)
   08/30/14 1534  OBSTRUCTIVE SLEEP APNEA  Have you ever been diagnosed with sleep apnea through a sleep study? No  Do you snore loudly (loud enough to be heard through closed doors)?  1  Do you often feel tired, fatigued, or sleepy during the daytime? 1  Has anyone observed you stop breathing during your sleep? 0  Do you have, or are you being treated for high blood pressure? 1  BMI more than 35 kg/m2? 1  Age over 58 years old? 1  Neck circumference greater than 40 cm/16 inches? 0  Gender: 0

## 2014-08-30 NOTE — Progress Notes (Signed)
Pt. [place], she has not seen him  in one yr. + due to finances.

## 2014-08-30 NOTE — Progress Notes (Signed)
Call to Pharmacy tech. - for medication reconciliation

## 2014-08-30 NOTE — Pre-Procedure Instructions (Signed)
[name]  08/30/2014   Your procedure is scheduled on:  09/07/2014  [facility] at 10:00 AM.  Call this number if you have problems the morning of surgery: [phone]   Remember:   Do not eat food or drink liquids after midnight. On Wednesday  Take these medicines the morning of surgery with A SIP OF WATER: EFFEXOR  only   Do not wear jewelry, make-up or nail polish.   Do not wear lotions, powders, or perfumes. You may wear deodorant.   Do not shave 48 hours prior to surgery.    Do not bring valuables to the hospital.  [facility] not responsible                  for any belongings or valuables.                Contacts, dentures or bridgework may not be worn into surgery.   Leave suitcase in the car. After surgery it may be brought to your room.   For patients admitted to the hospital, discharge time is determined by your                treatment team.               Patients discharged the day of surgery will not be allowed to drive  home.  Name and phone number of your driver: with family   Special Instructions: Special Instructions: [facility] - Preparing for Surgery  Before surgery, you can play an important role.  Because skin is not sterile, your skin needs to be as free of germs as possible.  You can reduce the number of germs on you skin by washing with CHG (chlorahexidine gluconate) soap before surgery.  CHG is an antiseptic cleaner which kills germs and bonds with the skin to continue killing germs even after washing.  Please DO NOT use if you have an allergy to CHG or antibacterial soaps.  If your skin becomes reddened/irritated stop using the CHG and inform your nurse when you arrive at Short Stay.  Do not shave (including legs and underarms) for at least 48 hours prior to the first CHG shower.  You may shave your face.  Please follow these instructions carefully:   1.  Shower with CHG Soap the night before surgery and the   morning of Surgery.  2.  If you choose to wash your hair, wash your hair first as usual with your  normal shampoo.  3.  After you shampoo, rinse your hair and body thoroughly to remove the  Shampoo.  4.  Use CHG as you would any other liquid soap.  You can apply chg directly to the skin and wash gently with scrungie or a clean washcloth.  5.  Apply the CHG Soap to your body ONLY FROM THE NECK DOWN.    Do not use on open wounds or open sores.  Avoid contact with your eyes, ears, mouth and genitals (private parts).  Wash genitals (private parts)   with your normal soap.  6.  Wash thoroughly, paying special attention to the area where your surgery will be performed.  7.  Thoroughly rinse your body with warm water from the neck down.  8.  DO NOT shower/wash with your normal soap after using and rinsing off   the CHG Soap.  9.  Pat yourself dry with a clean towel.  10.  Wear clean pajamas.            11.  Place clean sheets on your bed the night of your first shower and do not sleep with pets.  Day of Surgery  Do not apply any lotions/deodorants the morning of surgery.  Please wear clean clothes to the hospital/surgery center.   Please read over the following fact sheets that you were given: Pain Booklet, Coughing and Deep Breathing and Surgical Site Infection Prevention

## 2014-08-31 NOTE — Progress Notes (Signed)
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 Conchita ParisNundkumar. She feel in 05/2014 and CT of the head showed an incidental findings most consistent with cerebral aneurysm.  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  History includes smoking, DM2, HTN, HLD, depression, UHR. OSA screening score was 4 or greater. PCP is Dr. Megan MansMcGinnis.  BMI is consistent with obesity.  She is no routinely followed by cardiology, but saw Dr. McDowell/Dr. Dietrich Patesothbart during a hospitalization for chest pain in 2012 and had a non-ischemic stress test.   Meds include ASA, Plavix, Norco, Novolog, Lantus, metformin, Benicar HCT, Percocet, Crestor, Effexor XR.    08/30/14 EKG: NSR, minimal voltage criteria for LVH, may be normal variant, non-specific ST/T wave abnormality. No significant change since last tracing.   04/17/11 Nuclear stress test: Negative stress nuclear myocardial study revealing somewhat impaired exercise capacity, no stress induced EKG abnormalities, normal left ventricular size and normal left ventricular systolic function. LVEF > 65%. By scintigraphic imaging, there was normal myocardial perfusion without evidence for ischemia or infarction.   CXR 08/30/14: No acute cardiopulmonary disease.  Preoperative labs noted. Non-fasting glucose was 373. She reports that she is compliant with her DM medication regimen.  She takes her Lantus at night.  Fasting sugars typically < 200 (~ 170's), but can be higher after meals.  She had eaten before her PAT appointment but had taken her medications as usual.  She checks her glucose at least once a day.  She is currently getting medications refilled through the mental health clinic/health department.  I did notify her that in the short term, to call her PCP or health department clinician if her fasting glucose is running > 200 because a fasting glucose  > 200 may lead to a cancellation of her procedure. In the long term, I encouraged her to make a DM follow-up appointment to get her DM better controlled to help prevent long-term complications associated with poorly controlled DM.  I have updated [name] at Dr. Val RilesNundkumar's office.  She will get a fasting CBG on arrival.  She is not scheduled to arrive in [place] until ~ 10A, so I told her to come in or call if issues with hypo- or hyper-glycemia at home on the morning of surgery.    Tina Ochsllison [name], PA-C [facility]/Anesthesiology Phone [phone] 08/31/2014 10:33 AM

## 2014-09-06 MED ORDER — CEFAZOLIN SODIUM-DEXTROSE 2-3 GM-% IV SOLR
2.0000 g | INTRAVENOUS | Status: AC
  Administered 2014-09-07: 2 g via INTRAVENOUS
  Filled 2014-09-06: qty 50

## 2014-09-07 ENCOUNTER — Ambulatory Visit (HOSPITAL_COMMUNITY)
Admission: RE | Admit: 2014-09-07 | Discharge: 2014-09-07 | Disposition: A | Discharge status: Home / Self Care | Payer: Medicaid Other | Source: Ambulatory Visit | Attending: Neurosurgery | Admitting: Neurosurgery

## 2014-09-07 ENCOUNTER — Encounter (HOSPITAL_COMMUNITY)
Admission: RE | Disposition: A | Discharge status: Home / Self Care | Payer: Self-pay | Source: Ambulatory Visit | Attending: Neurosurgery

## 2014-09-07 ENCOUNTER — Inpatient Hospital Stay (HOSPITAL_COMMUNITY)
Admission: RE | Admit: 2014-09-07 | Discharge: 2014-09-08 | DRG: 027 | Disposition: A | Discharge status: Home / Self Care | Payer: Medicaid Other | Source: Ambulatory Visit | Attending: Neurosurgery | Admitting: Neurosurgery

## 2014-09-07 ENCOUNTER — Inpatient Hospital Stay (HOSPITAL_COMMUNITY): Payer: Medicaid Other | Admitting: Vascular Surgery

## 2014-09-07 ENCOUNTER — Encounter (HOSPITAL_COMMUNITY): Payer: Self-pay | Admitting: Certified Registered"

## 2014-09-07 ENCOUNTER — Inpatient Hospital Stay (HOSPITAL_COMMUNITY): Payer: Medicaid Other | Admitting: Certified Registered"

## 2014-09-07 DIAGNOSIS — Z7902 Long term (current) use of antithrombotics/antiplatelets: Secondary | ICD-10-CM

## 2014-09-07 DIAGNOSIS — I671 Cerebral aneurysm, nonruptured: Secondary | ICD-10-CM

## 2014-09-07 DIAGNOSIS — I10 Essential (primary) hypertension: Secondary | ICD-10-CM | POA: Diagnosis present

## 2014-09-07 DIAGNOSIS — E119 Type 2 diabetes mellitus without complications: Secondary | ICD-10-CM

## 2014-09-07 DIAGNOSIS — Z794 Long term (current) use of insulin: Secondary | ICD-10-CM

## 2014-09-07 DIAGNOSIS — Z7982 Long term (current) use of aspirin: Secondary | ICD-10-CM

## 2014-09-07 DIAGNOSIS — F329 Major depressive disorder, single episode, unspecified: Secondary | ICD-10-CM | POA: Diagnosis present

## 2014-09-07 DIAGNOSIS — F17210 Nicotine dependence, cigarettes, uncomplicated: Secondary | ICD-10-CM

## 2014-09-07 DIAGNOSIS — Z79899 Other long term (current) drug therapy: Secondary | ICD-10-CM

## 2014-09-07 DIAGNOSIS — E782 Mixed hyperlipidemia: Secondary | ICD-10-CM

## 2014-09-07 DIAGNOSIS — M4686 Other specified inflammatory spondylopathies, lumbar region: Secondary | ICD-10-CM

## 2014-09-07 DIAGNOSIS — Z23 Encounter for immunization: Secondary | ICD-10-CM

## 2014-09-07 DIAGNOSIS — I729 Aneurysm of unspecified site: Secondary | ICD-10-CM

## 2014-09-07 HISTORY — PX: RADIOLOGY WITH ANESTHESIA: SHX6223

## 2014-09-07 LAB — APTT: aPTT: 35 seconds (ref 24–37)

## 2014-09-07 LAB — CBC
HCT: 34.2 % — ABNORMAL LOW (ref 36.0–46.0)
Hemoglobin: 11.5 g/dL — ABNORMAL LOW (ref 12.0–15.0)
MCH: 30.1 pg (ref 26.0–34.0)
MCHC: 33.6 g/dL (ref 30.0–36.0)
MCV: 89.5 fL (ref 78.0–100.0)
Platelets: 128 10*3/uL — ABNORMAL LOW (ref 150–400)
RBC: 3.82 MIL/uL — ABNORMAL LOW (ref 3.87–5.11)
RDW: 12.9 % (ref 11.5–15.5)
WBC: 6.8 10*3/uL (ref 4.0–10.5)

## 2014-09-07 LAB — CREATININE, SERUM
Creatinine, Ser: 0.79 mg/dL (ref 0.50–1.10)
GFR calc Af Amer: >90 mL/min (ref >90)
GFR calc non Af Amer: >90 mL/min (ref >90)

## 2014-09-07 LAB — GLUCOSE, CAPILLARY
Glucose-Capillary: 245 mg/dL — ABNORMAL HIGH (ref 70–99)
Glucose-Capillary: 192 mg/dL — ABNORMAL HIGH (ref 70–99)

## 2014-09-07 LAB — PROTIME-INR
INR: 1.07 (ref 0.00–1.49)
Prothrombin Time: 14.1 seconds (ref 11.6–15.2)

## 2014-09-07 LAB — MRSA PCR SCREENING: MRSA by PCR: NEGATIVE

## 2014-09-07 SURGERY — RADIOLOGY WITH ANESTHESIA
Anesthesia: General

## 2014-09-07 MED ORDER — INSULIN GLARGINE 100 UNIT/ML ~~LOC~~ SOLN
20.0000 [IU] | Freq: Every day | SUBCUTANEOUS | Status: DC
  Administered 2014-09-08: 20 [IU] via SUBCUTANEOUS
  Filled 2014-09-07 (×2): qty 0.2

## 2014-09-07 MED ORDER — METFORMIN HCL 500 MG PO TABS
1000.0000 mg | ORAL_TABLET | Freq: Two times a day (BID) | ORAL | Status: DC
  Filled 2014-09-07: qty 2

## 2014-09-07 MED ORDER — HEPARIN SODIUM (PORCINE) 1000 UNIT/ML IJ SOLN
INTRAMUSCULAR | Status: DC | PRN
  Administered 2014-09-07 (×2): 1000 [IU] via INTRAVENOUS
  Administered 2014-09-07: 5000 [IU] via INTRAVENOUS

## 2014-09-07 MED ORDER — VENLAFAXINE HCL ER 75 MG PO CP24
75.0000 mg | ORAL_CAPSULE | Freq: Every day | ORAL | Status: DC
  Administered 2014-09-08: 75 mg via ORAL
  Filled 2014-09-07 (×3): qty 1

## 2014-09-07 MED ORDER — DEXTROSE 5 % IV SOLN
10.0000 mg | INTRAVENOUS | Status: DC | PRN
  Administered 2014-09-07: 20 ug/min via INTRAVENOUS

## 2014-09-07 MED ORDER — MIDAZOLAM HCL 5 MG/5ML IJ SOLN
INTRAMUSCULAR | Status: DC | PRN
  Administered 2014-09-07: 2 mg via INTRAVENOUS

## 2014-09-07 MED ORDER — PROPOFOL 10 MG/ML IV BOLUS
INTRAVENOUS | Status: DC | PRN
  Administered 2014-09-07: 100 mg via INTRAVENOUS
  Administered 2014-09-07: 200 mg via INTRAVENOUS
  Administered 2014-09-07: 50 mg via INTRAVENOUS

## 2014-09-07 MED ORDER — OXYCODONE-ACETAMINOPHEN 5-325 MG PO TABS
1.0000 | ORAL_TABLET | ORAL | Status: DC | PRN
  Administered 2014-09-08 (×3): 1 via ORAL
  Filled 2014-09-07 (×3): qty 1

## 2014-09-07 MED ORDER — ONDANSETRON HCL 4 MG/2ML IJ SOLN: 4.0000 mg | Freq: Four times a day (QID) | INTRAMUSCULAR | Status: DC | PRN

## 2014-09-07 MED ORDER — FENTANYL CITRATE 0.05 MG/ML IJ SOLN
INTRAMUSCULAR | Status: DC | PRN
  Administered 2014-09-07 (×2): 75 ug via INTRAVENOUS

## 2014-09-07 MED ORDER — FENTANYL CITRATE 0.05 MG/ML IJ SOLN
25.0000 ug | INTRAMUSCULAR | Status: DC | PRN
  Administered 2014-09-07 (×2): 50 ug via INTRAVENOUS

## 2014-09-07 MED ORDER — ONDANSETRON HCL 4 MG PO TABS: 4.0000 mg | ORAL_TABLET | ORAL | Status: DC | PRN

## 2014-09-07 MED ORDER — NEOSTIGMINE METHYLSULFATE 10 MG/10ML IV SOLN
INTRAVENOUS | Status: DC | PRN
  Administered 2014-09-07: 3 mg via INTRAVENOUS

## 2014-09-07 MED ORDER — GLYCOPYRROLATE 0.2 MG/ML IJ SOLN
INTRAMUSCULAR | Status: DC | PRN
  Administered 2014-09-07: 0.4 mg via INTRAVENOUS

## 2014-09-07 MED ORDER — IRBESARTAN 150 MG PO TABS
150.0000 mg | ORAL_TABLET | Freq: Every day | ORAL | Status: DC
  Administered 2014-09-08: 150 mg via ORAL
  Filled 2014-09-07: qty 1

## 2014-09-07 MED ORDER — ASPIRIN EC 325 MG PO TBEC
DELAYED_RELEASE_TABLET | ORAL | Status: AC
  Administered 2014-09-07: 325 mg via ORAL
  Filled 2014-09-07: qty 1

## 2014-09-07 MED ORDER — HEPARIN SODIUM (PORCINE) 5000 UNIT/ML IJ SOLN
5000.0000 [IU] | Freq: Three times a day (TID) | INTRAMUSCULAR | Status: DC
  Administered 2014-09-07 – 2014-09-08 (×2): 5000 [IU] via SUBCUTANEOUS
  Filled 2014-09-07 (×4): qty 1

## 2014-09-07 MED ORDER — DOCUSATE SODIUM 100 MG PO CAPS
100.0000 mg | ORAL_CAPSULE | Freq: Two times a day (BID) | ORAL | Status: DC
  Administered 2014-09-07 – 2014-09-08 (×2): 100 mg via ORAL
  Filled 2014-09-07 (×3): qty 1

## 2014-09-07 MED ORDER — ONDANSETRON HCL 4 MG/2ML IJ SOLN
INTRAMUSCULAR | Status: DC | PRN
  Administered 2014-09-07: 4 mg via INTRAVENOUS

## 2014-09-07 MED ORDER — INSULIN ASPART 100 UNIT/ML FLEXPEN: 8.0000 [IU] | PEN_INJECTOR | Freq: Four times a day (QID) | SUBCUTANEOUS | Status: DC

## 2014-09-07 MED ORDER — LACTATED RINGERS IV SOLN
INTRAVENOUS | Status: DC
  Administered 2014-09-07 (×2): via INTRAVENOUS

## 2014-09-07 MED ORDER — LABETALOL HCL 5 MG/ML IV SOLN: 10.0000 mg | INTRAVENOUS | Status: DC | PRN

## 2014-09-07 MED ORDER — SUCCINYLCHOLINE CHLORIDE 20 MG/ML IJ SOLN
INTRAMUSCULAR | Status: DC | PRN
  Administered 2014-09-07: 100 mg via INTRAVENOUS

## 2014-09-07 MED ORDER — ROSUVASTATIN CALCIUM 20 MG PO TABS
20.0000 mg | ORAL_TABLET | Freq: Every day | ORAL | Status: DC
  Administered 2014-09-08 (×2): 20 mg via ORAL
  Filled 2014-09-07 (×3): qty 1

## 2014-09-07 MED ORDER — LIDOCAINE HCL (CARDIAC) 20 MG/ML IV SOLN
INTRAVENOUS | Status: DC | PRN
  Administered 2014-09-07: 65 mg via INTRAVENOUS

## 2014-09-07 MED ORDER — ROCURONIUM BROMIDE 100 MG/10ML IV SOLN
INTRAVENOUS | Status: DC | PRN
  Administered 2014-09-07: 50 mg via INTRAVENOUS
  Administered 2014-09-07: 10 mg via INTRAVENOUS
  Administered 2014-09-07: 30 mg via INTRAVENOUS
  Administered 2014-09-07: 10 mg via INTRAVENOUS

## 2014-09-07 MED ORDER — IOHEXOL 300 MG/ML  SOLN
150.0000 mL | Freq: Once | INTRAMUSCULAR | Status: AC | PRN
  Administered 2014-09-07: 150 mL via INTRAVENOUS

## 2014-09-07 MED ORDER — MORPHINE SULFATE 2 MG/ML IJ SOLN
INTRAMUSCULAR | Status: AC
  Filled 2014-09-07: qty 1

## 2014-09-07 MED ORDER — PANTOPRAZOLE SODIUM 40 MG IV SOLR
40.0000 mg | Freq: Every day | INTRAVENOUS | Status: DC
  Administered 2014-09-07: 40 mg via INTRAVENOUS
  Filled 2014-09-07 (×2): qty 40

## 2014-09-07 MED ORDER — CLOPIDOGREL BISULFATE 75 MG PO TABS
75.0000 mg | ORAL_TABLET | Freq: Every day | ORAL | Status: DC
  Administered 2014-09-08 (×2): 75 mg via ORAL
  Filled 2014-09-07 (×3): qty 1

## 2014-09-07 MED ORDER — OXYCODONE HCL 5 MG/5ML PO SOLN: 5.0000 mg | Freq: Once | ORAL | Status: DC | PRN

## 2014-09-07 MED ORDER — CLOPIDOGREL BISULFATE 75 MG PO TABS
ORAL_TABLET | ORAL | Status: AC
  Administered 2014-09-07: 75 mg via ORAL
  Filled 2014-09-07: qty 1

## 2014-09-07 MED ORDER — OLMESARTAN MEDOXOMIL-HCTZ 20-12.5 MG PO TABS: 1.0000 | ORAL_TABLET | Freq: Every day | ORAL | Status: DC

## 2014-09-07 MED ORDER — OXYCODONE HCL 5 MG PO TABS: 5.0000 mg | ORAL_TABLET | Freq: Once | ORAL | Status: DC | PRN

## 2014-09-07 MED ORDER — VECURONIUM BROMIDE 10 MG IV SOLR
INTRAVENOUS | Status: DC | PRN
  Administered 2014-09-07: 2 mg via INTRAVENOUS
  Administered 2014-09-07: 1 mg via INTRAVENOUS

## 2014-09-07 MED ORDER — INSULIN ASPART 100 UNIT/ML ~~LOC~~ SOLN
8.0000 [IU] | Freq: Three times a day (TID) | SUBCUTANEOUS | Status: DC
  Administered 2014-09-07 – 2014-09-08 (×3): 8 [IU] via SUBCUTANEOUS

## 2014-09-07 MED ORDER — MORPHINE SULFATE 2 MG/ML IJ SOLN
1.0000 mg | INTRAMUSCULAR | Status: DC | PRN
  Administered 2014-09-07 – 2014-09-08 (×3): 2 mg via INTRAVENOUS
  Filled 2014-09-07 (×2): qty 1

## 2014-09-07 MED ORDER — SENNA 8.6 MG PO TABS
1.0000 | ORAL_TABLET | Freq: Two times a day (BID) | ORAL | Status: DC
  Administered 2014-09-07 – 2014-09-08 (×2): 8.6 mg via ORAL
  Filled 2014-09-07 (×3): qty 1

## 2014-09-07 MED ORDER — SODIUM CHLORIDE 0.9 % IV SOLN
INTRAVENOUS | Status: DC
  Administered 2014-09-07 – 2014-09-08 (×2): via INTRAVENOUS

## 2014-09-07 MED ORDER — HYDROCHLOROTHIAZIDE 12.5 MG PO CAPS
12.5000 mg | ORAL_CAPSULE | Freq: Every day | ORAL | Status: DC
  Administered 2014-09-08: 12.5 mg via ORAL
  Filled 2014-09-07: qty 1

## 2014-09-07 MED ORDER — PNEUMOCOCCAL VAC POLYVALENT 25 MCG/0.5ML IJ INJ
0.5000 mL | INJECTION | INTRAMUSCULAR | Status: AC
  Administered 2014-09-08: 0.5 mL via INTRAMUSCULAR
  Filled 2014-09-07: qty 0.5

## 2014-09-07 MED ORDER — LACTATED RINGERS IV SOLN
INTRAVENOUS | Status: DC | PRN
  Administered 2014-09-07: 12:00:00 via INTRAVENOUS

## 2014-09-07 MED ORDER — FENTANYL CITRATE 0.05 MG/ML IJ SOLN
INTRAMUSCULAR | Status: AC
  Filled 2014-09-07: qty 2

## 2014-09-07 MED ORDER — INSULIN GLARGINE 100 UNIT/ML SOLOSTAR PEN: 20.0000 [IU] | PEN_INJECTOR | Freq: Every day | SUBCUTANEOUS | Status: DC

## 2014-09-07 MED ORDER — ONDANSETRON HCL 4 MG/2ML IJ SOLN: 4.0000 mg | INTRAMUSCULAR | Status: DC | PRN

## 2014-09-07 NOTE — Anesthesia Preprocedure Evaluation (Addendum)
Anesthesia Evaluation  Patient identified by MRN, date of birth, ID band Patient awake    Reviewed: Allergy & Precautions, NPO status , Patient's Chart, lab work & pertinent test results  Airway Mallampati: II  TM Distance: >3 FB Neck ROM: Full    Dental  (+) Teeth Intact, Dental Advisory Given   Pulmonary Current Smoker,          Cardiovascular hypertension, Pt. on medications     Neuro/Psych  Headaches, Depression    GI/Hepatic   Endo/Other  diabetes, Well Controlled, Type 2, Oral Hypoglycemic Agentsobese  Renal/GU      Musculoskeletal   Abdominal   Peds  Hematology   Anesthesia Other Findings   Reproductive/Obstetrics                            Anesthesia Physical Anesthesia Plan  ASA: III  Anesthesia Plan: General   Post-op Pain Management:    Induction: Intravenous  Airway Management Planned: Oral ETT  Additional Equipment: Arterial line  Intra-op Plan:   Post-operative Plan: Extubation in OR  Informed Consent: I have reviewed the patients History and Physical, chart, labs and discussed the procedure including the risks, benefits and alternatives for the proposed anesthesia with the patient or authorized representative who has indicated his/her understanding and acceptance.   Dental advisory given  Plan Discussed with: CRNA, Anesthesiologist and Surgeon  Anesthesia Plan Comments:         Anesthesia Quick Evaluation

## 2014-09-07 NOTE — Progress Notes (Signed)
eLink Physician-Brief Progress Note Patient Name: [name] DOB: 07/09/1955 MRN: [id number]   Date of Service  09/07/2014  HPI/Events of Note  Sub arachnoid hemorrhage, s/p IR procedure Stable on camera check  eICU Interventions  No eICU     Intervention Category Evaluation Type: New Patient Evaluation  MCQUAID, DOUGLAS 09/07/2014, 7:18 PM

## 2014-09-07 NOTE — Progress Notes (Signed)
PREOP DX:  1. Basilar apex aneurysm  POSTOP DX: Same  PROCEDURE:  1. Stent supported coil embolization of basilar apex aneurysm  SURGEON: Dr. Lisbeth RenshawNeelesh Tempestt Silba, MD  ANESTHESIA: GETA  EBL: Minimal  SPECIMENS: None  COMPLICATIONS: None  CONDITION: Stable to recovery  FINDINGS: 1. Successful stent-supported (basilar to right P1) coil embolization of basilar apex aneurysm. Minimal aneurysm residual post embolization 2. Pt awake, alert, following commands, moving all extremities well post-procedure

## 2014-09-07 NOTE — Anesthesia Procedure Notes (Signed)
Procedure Name: Intubation Date/Time: 09/07/2014 1:16 PM Performed by: Charm BargesBUTLER, Tanija Germani R Pre-anesthesia Checklist: Patient identified, Emergency Drugs available, Suction available, Patient being monitored and Timeout performed Patient Re-evaluated:Patient Re-evaluated prior to inductionOxygen Delivery Method: Circle system utilized Preoxygenation: Pre-oxygenation with 100% oxygen Intubation Type: IV induction Ventilation: Mask ventilation without difficulty Laryngoscope Size: Mac and 3 Grade View: Grade II Tube type: Oral Tube size: 8.0 mm Number of attempts: 1 Airway Equipment and Method: Stylet Placement Confirmation: ETT inserted through vocal cords under direct vision and positive ETCO2 Secured at: 21 cm Tube secured with: Tape Dental Injury: Teeth and Oropharynx as per pre-operative assessment

## 2014-09-07 NOTE — H&P (Signed)
CC:  Aneurysm  HPI: [name] is a 59 y.o. female who had incidental discovery of a basilar apex and right MCA aneurysm on CTA after a fall. She underwent diagnostic cerebral angiogram and now presents for treatment of the basilar aneurysm. She has been on ASA/Plavix for 10 days, and will get a dose this am.  PMH: Past Medical History  Diagnosis Date  . Type 2 diabetes mellitus   . Essential hypertension, benign   . Mixed hyperlipidemia   . Diabetes mellitus   . Arthritis     degenerative lumbar spine   . Pneumonia 2014    [facility]- admitted for 4 days  . Depression   . Headache     headache- R side of head  . H/O cardiovascular stress test 2012    wnl    PSH: Past Surgical History  Procedure Laterality Date  . Umbilical hernia repair  2008  . Bilateral tubal ligation      SH: History  Substance Use Topics  . Smoking status: Current Every Day Smoker -- 1.00 packs/day for 40 years    Types: Cigarettes  . Smokeless tobacco: Never Used  . Alcohol Use: No    MEDS: Prior to Admission medications   Medication Sig Start Date End Date Taking? Authorizing Provider  aspirin EC 81 MG tablet Take 81 mg by mouth daily.   Yes Historical Provider, MD  clopidogrel (PLAVIX) 75 MG tablet Take 75 mg by mouth daily.   Yes Historical Provider, MD  insulin aspart (NOVOLOG) 100 UNIT/ML FlexPen Inject 8 Units into the skin 4 (four) times daily.   Yes Historical Provider, MD  Insulin Glargine (LANTUS) 100 UNIT/ML Solostar Pen Inject 20 Units into the skin at bedtime.    Yes Historical Provider, MD  metFORMIN (GLUCOPHAGE) 500 MG tablet Take 1,000 mg by mouth 2 (two) times daily with a meal.    Yes Historical Provider, MD  olmesartan-hydrochlorothiazide (BENICAR HCT) 20-12.5 MG per tablet Take 1 tablet by mouth daily.   Yes Historical Provider, MD  oxyCODONE-acetaminophen (PERCOCET/ROXICET) 5-325 MG per tablet Take 1 tablet by mouth every 4 (four) hours as needed. Patient taking differently:  Take 1 tablet by mouth every 4 (four) hours as needed (pain).  08/17/14  Yes Dione Boozeavid Glick, MD  rosuvastatin (CRESTOR) 20 MG tablet Take 20 mg by mouth daily.   Yes Historical Provider, MD  venlafaxine XR (EFFEXOR-XR) 75 MG 24 hr capsule Take 75 mg by mouth daily with breakfast.   Yes Historical Provider, MD  Aspirin-Salicylamide-Caffeine (BC HEADACHE POWDER PO) Take 1 packet by mouth daily as needed (pain).    Historical Provider, MD  HYDROcodone-acetaminophen (NORCO/VICODIN) 5-325 MG per tablet Take one-two tabs po q 4-6 hrs prn pain Patient not taking: Reported on 08/30/2014 08/13/14   Tammi Triplett, PA-C  naproxen (NAPROSYN) 500 MG tablet Take 1 tablet (500 mg total) by mouth 2 (two) times daily with a meal. Patient not taking: Reported on 08/30/2014 08/13/14   Tammi Triplett, PA-C  oxyCODONE-acetaminophen (PERCOCET) 5-325 MG per tablet Take 1 tablet by mouth every 4 (four) hours as needed for moderate pain. Patient not taking: Reported on 08/30/2014 08/17/14   Dione Boozeavid Glick, MD    ALLERGY: No Known Allergies  ROS: ROS  NEUROLOGIC EXAM: Awake, alert, oriented Memory and concentration grossly intact Speech fluent, appropriate CN grossly intact Motor exam: Upper Extremities Deltoid Bicep Tricep Grip  Right 5/5 5/5 5/5 5/5  Left 5/5 5/5 5/5 5/5   Lower Extremity IP Quad  PF DF EHL  Right 5/5 5/5 5/5 5/5 5/5  Left 5/5 5/5 5/5 5/5 5/5   Sensation grossly intact to LT  [facility]: Diagnostic angiogram demonstrates an ~18mm basilar apex aneurysm  IMPRESSION: - 59 y.o. female with unruptured basilar apex aneurysm, and smaller RMCA aneurysm.  PLAN: - Proceed with possible stent-supported coil embolization of basilar apex aneurysm.  I spoke with the patient and her family at length in the office about the procedure. Risks and benefits were also reviewed in detail. All her questions were answered, and she provided consent to proceed as above.

## 2014-09-07 NOTE — Transfer of Care (Signed)
Immediate Anesthesia Transfer of Care Note  Patient: [name]  Procedure(s) Performed: Procedure(s): Embolization Stent Supported Coil of Aneurysm (N/A)  Patient Location: PACU  Anesthesia Type:General  Level of Consciousness: awake and oriented  Airway & Oxygen Therapy: Patient Spontanous Breathing and Patient connected to nasal cannula oxygen  Post-op Assessment: Report given to RN, Post -op Vital signs reviewed and stable and Patient moving all extremities  Post vital signs: Reviewed and stable  Last Vitals:  Filed Vitals:   09/07/14 1036  BP: 99/61  Pulse: 71  Temp: 36.8 C  Resp: 20    Complications: No apparent anesthesia complications

## 2014-09-08 ENCOUNTER — Emergency Department (HOSPITAL_COMMUNITY)
Admission: EM | Admit: 2014-09-08 | Discharge: 2014-09-08 | Disposition: A | Discharge status: Home / Self Care | Payer: Disability Insurance | Attending: Emergency Medicine | Admitting: Emergency Medicine

## 2014-09-08 ENCOUNTER — Encounter (HOSPITAL_COMMUNITY): Payer: Self-pay | Admitting: Neurosurgery

## 2014-09-08 DIAGNOSIS — Z7902 Long term (current) use of antithrombotics/antiplatelets: Secondary | ICD-10-CM | POA: Insufficient documentation

## 2014-09-08 DIAGNOSIS — Z79899 Other long term (current) drug therapy: Secondary | ICD-10-CM | POA: Insufficient documentation

## 2014-09-08 DIAGNOSIS — E119 Type 2 diabetes mellitus without complications: Secondary | ICD-10-CM | POA: Insufficient documentation

## 2014-09-08 DIAGNOSIS — Z720 Tobacco use: Secondary | ICD-10-CM | POA: Insufficient documentation

## 2014-09-08 DIAGNOSIS — E785 Hyperlipidemia, unspecified: Secondary | ICD-10-CM | POA: Insufficient documentation

## 2014-09-08 DIAGNOSIS — I10 Essential (primary) hypertension: Secondary | ICD-10-CM | POA: Insufficient documentation

## 2014-09-08 DIAGNOSIS — Z794 Long term (current) use of insulin: Secondary | ICD-10-CM | POA: Insufficient documentation

## 2014-09-08 DIAGNOSIS — R040 Epistaxis: Secondary | ICD-10-CM | POA: Insufficient documentation

## 2014-09-08 DIAGNOSIS — Z8659 Personal history of other mental and behavioral disorders: Secondary | ICD-10-CM | POA: Insufficient documentation

## 2014-09-08 DIAGNOSIS — Z8701 Personal history of pneumonia (recurrent): Secondary | ICD-10-CM | POA: Insufficient documentation

## 2014-09-08 DIAGNOSIS — Z7982 Long term (current) use of aspirin: Secondary | ICD-10-CM | POA: Insufficient documentation

## 2014-09-08 DIAGNOSIS — M1990 Unspecified osteoarthritis, unspecified site: Secondary | ICD-10-CM | POA: Insufficient documentation

## 2014-09-08 LAB — GLUCOSE, CAPILLARY
Glucose-Capillary: 150 mg/dL — ABNORMAL HIGH (ref 70–99)
Glucose-Capillary: 238 mg/dL — ABNORMAL HIGH (ref 70–99)
Glucose-Capillary: 218 mg/dL — ABNORMAL HIGH (ref 70–99)
Glucose-Capillary: 230 mg/dL — ABNORMAL HIGH (ref 70–99)

## 2014-09-08 MED ORDER — ONDANSETRON 8 MG PO TBDP
8.0000 mg | ORAL_TABLET | Freq: Once | ORAL | Status: AC
  Administered 2014-09-08: 8 mg via ORAL
  Filled 2014-09-08: qty 1

## 2014-09-08 MED ORDER — OXYCODONE-ACETAMINOPHEN 5-325 MG PO TABS: 1.0000 | ORAL_TABLET | Freq: Four times a day (QID) | ORAL | Status: DC | PRN

## 2014-09-08 MED ORDER — HYDROMORPHONE HCL 1 MG/ML IJ SOLN
1.0000 mg | Freq: Once | INTRAMUSCULAR | Status: AC
  Administered 2014-09-08: 1 mg via INTRAMUSCULAR
  Filled 2014-09-08: qty 1

## 2014-09-08 MED ORDER — PANTOPRAZOLE SODIUM 40 MG PO TBEC
40.0000 mg | DELAYED_RELEASE_TABLET | Freq: Every day | ORAL | Status: DC
  Administered 2014-09-08: 40 mg via ORAL
  Filled 2014-09-08: qty 1

## 2014-09-08 MED ORDER — METFORMIN HCL 500 MG PO TABS: 1000.0000 mg | ORAL_TABLET | Freq: Two times a day (BID) | ORAL | Status: DC

## 2014-09-08 NOTE — Progress Notes (Signed)
UR completed.  Dally Oshel, RN BSN [facility] CCM Trauma/Neuro ICU Case Manager [phone]  

## 2014-09-08 NOTE — Discharge Instructions (Signed)
Nosebleed A nosebleed can be caused by many things, including:  Getting hit hard in the nose.  Infections.  Dry nose.  Colds.  Medicines. Your doctor may do lab testing if you get nosebleeds a lot and the cause is not known. HOME CARE   If your nose was packed with material, keep it there until your doctor takes it out. Put the pack back in your nose if the pack falls out.  Do not blow your nose for 12 hours after the nosebleed.  Sit up and bend forward if your nose starts bleeding again. Pinch the front half of your nose nonstop for 20 minutes.  Put petroleum jelly inside your nose every morning if you have a dry nose.  Use a humidifier to make the air less dry.  Do not take aspirin.  Try not to strain, lift, or bend at the waist for many days after the nosebleed. GET HELP RIGHT AWAY IF:   Nosebleeds keep happening and are hard to stop or control.  You have bleeding or bruises that are not normal on other parts of the body.  You have a fever.  The nosebleeds get worse.  You get lightheaded, feel faint, sweaty, or throw up (vomit) blood. MAKE SURE YOU:   Understand these instructions.  Will watch your condition.  Will get help right away if you are not doing well or get worse. Document Released: 03/18/2008 Document Revised: 09/01/2011 Document Reviewed: 03/18/2008 [facility] Patient Information 2015 [employer] [place]. This information is not intended to replace advice given to you by your health care provider. Make sure you discuss any questions you have with your health care provider.   Apply antibiotic ointment to the inside of your nose, salt water drops, use a vaporizer.   If bleeding returns, apply ice and pressure to nose. Do not pay Q nose. Try not to sneeze.

## 2014-09-08 NOTE — ED Notes (Addendum)
Pt reports nosebleed and ha since 1600 today. Pt states she had brain aneurysm  [facility] and was d/c'd from ICU today. [id number].

## 2014-09-08 NOTE — Progress Notes (Signed)
Inpatient Diabetes Program Recommendations  AACE/ADA: New Consensus Statement on Inpatient Glycemic Control (2013)  Target Ranges:  Prepandial:   less than 140 mg/dL      Peak postprandial:   less than 180 mg/dL (1-2 hours)      Critically ill patients:  140 - 180 mg/dL     Results for [name], [name] (MRN [id number]) as of 09/08/2014 07:11  Ref. Range 09/08/2014 01:03 09/08/2014 03:40  Glucose-Capillary Latest Range: 70-99 mg/dL 401150 (H) 027238 (H)     Chief Complaint: Anuerysm  History: DM, HTN  Home DM Meds: Lantus 20 units QHS         Novolog 8 units QID         Metformin 1000 mg bid  Current DM Orders: Lantus 20 units QHS     Novolog 8 units tid ac + HS    Metformin 1000 mg bid    **Note patient underwent coil embolization last PM.  **Patient started on PO diet at midnight.     MD- Please consider the following:  1. D/C Novolog 8 units QID- Change to Novolog 8 units tidwc  2. Start Novolog Moderate SSI tidac + HS      Will follow Ambrose FinlandJeannine Johnston Rafay Dahan RN, MSN, CDE Diabetes Coordinator Inpatient Diabetes Program Team Pager: [phone] (8a-5p)

## 2014-09-08 NOTE — ED Provider Notes (Signed)
CSN: [id number]     Arrival date & time 09/08/14  1855 History   First MD Initiated Contact with Patient 09/08/14 1912     Chief Complaint  Patient presents with  . Epistaxis     (Consider location/radiation/quality/duration/timing/severity/associated sxs/prior Treatment) HPI... Nosebleed from left nares since this afternoon. Patient is status stent supported coil embolization of aneurysm [facility]. No neurological deficits noted today. No fever, chills, stiff neck. Bleeding has essentially stopped in the emergency department. Severity is mild to moderate.  Past Medical History  Diagnosis Date  . Type 2 diabetes mellitus   . Essential hypertension, benign   . Mixed hyperlipidemia   . Diabetes mellitus   . Arthritis     degenerative lumbar spine   . Pneumonia 2014    [facility]- admitted for 4 days  . Depression   . Headache     headache- R side of head  . H/O cardiovascular stress test 2012    [facility]   Past Surgical History  Procedure Laterality Date  . Umbilical hernia repair  2008  . Bilateral tubal ligation    . Radiology with anesthesia N/A 09/07/2014    Procedure: Embolization Stent Supported Coil of Aneurysm;  Surgeon: Lisbeth RenshawNeelesh Nundkumar, MD;  [facility];  Service: Radiology;  Laterality: N/A;   Family History  Problem Relation Age of Onset  . Cancer Father   . COPD Mother    History  Substance Use Topics  . Smoking status: Current Every Day Smoker -- 1.00 packs/day for 40 years    Types: Cigarettes  . Smokeless tobacco: Never Used  . Alcohol Use: No   OB History    No data available     Review of Systems  All other systems reviewed and are negative.     Allergies  Review of patient's allergies indicates no known allergies.  Home Medications   Prior to Admission medications   Medication Sig Start Date End Date Taking? Authorizing Provider  Aspirin-Salicylamide-Caffeine (BC HEADACHE POWDER PO) Take 1 packet by mouth daily as needed  (pain).   Yes Historical Provider, MD  clopidogrel (PLAVIX) 75 MG tablet Take 75 mg by mouth daily.   Yes Historical Provider, MD  insulin aspart (NOVOLOG) 100 UNIT/ML FlexPen Inject 8 Units into the skin 4 (four) times daily.   Yes Historical Provider, MD  Insulin Glargine (LANTUS) 100 UNIT/ML Solostar Pen Inject 20 Units into the skin at bedtime.    Yes Historical Provider, MD  metFORMIN (GLUCOPHAGE) 500 MG tablet Take 2 tablets (1,000 mg total) by mouth 2 (two) times daily with a meal. 09/09/14  Yes Lisbeth RenshawNeelesh Nundkumar, MD  olmesartan-hydrochlorothiazide (BENICAR HCT) 20-12.5 MG per tablet Take 1 tablet by mouth daily.   Yes Historical Provider, MD  oxyCODONE-acetaminophen (PERCOCET/ROXICET) 5-325 MG per tablet Take 1 tablet by mouth every 4 (four) hours as needed. Patient taking differently: Take 1 tablet by mouth every 4 (four) hours as needed (pain).  08/17/14  Yes Dione Boozeavid Glick, MD  rosuvastatin (CRESTOR) 20 MG tablet Take 20 mg by mouth daily.   Yes Historical Provider, MD  venlafaxine XR (EFFEXOR-XR) 75 MG 24 hr capsule Take 75 mg by mouth daily with breakfast.   Yes Historical Provider, MD  naproxen (NAPROSYN) 500 MG tablet Take 1 tablet (500 mg total) by mouth 2 (two) times daily with a meal. Patient not taking: Reported on 08/30/2014 08/13/14   Tammi Triplett, PA-C  oxyCODONE-acetaminophen (ROXICET) 5-325 MG per tablet Take 1 tablet by mouth  every 6 (six) hours as needed for severe pain. 09/08/14   Coletta Memos, MD   BP 115/70 mmHg  Pulse 80  Temp(Src) 99.7 F (37.6 C)  Resp 18  Ht  (1.626 m)  Wt 213 lb (96.616 kg)  BMI 36.54 kg/m2  SpO2 91% Physical Exam  Constitutional: She is oriented to person, place, and time. She appears well-developed and well-nourished.  HENT:  Head: Normocephalic and atraumatic.  Blood clot in left lateral nares  Eyes: Conjunctivae and EOM are normal. Pupils are equal, round, and reactive to light.  Neck: Normal range of motion. Neck supple.   Cardiovascular: Normal rate and regular rhythm.   Pulmonary/Chest: Effort normal and breath sounds normal.  Abdominal: Soft. Bowel sounds are normal.  Musculoskeletal: Normal range of motion.  Neurological: She is alert and oriented to person, place, and time.  Skin: Skin is warm and dry.  Psychiatric: She has a normal mood and affect. Her behavior is normal.  Nursing note and vitals reviewed.   ED Course  Procedures (including critical care time) Labs Review Labs Reviewed - No data to display  Imaging Review No results found.   EKG Interpretation None      MDM   Final diagnoses:  Left-sided epistaxis    Patient has had almost no bleeding in the emergency department. I observed her for greater than 1 hour. No acute intervention necessary. Discussed treatment options including saline drops, Neosporin ointment, vaporizer at the bedside.    Donnetta Hutching, MD 09/08/14 2055

## 2014-09-08 NOTE — Discharge Summary (Signed)
  Physician Discharge Summary  Patient ID: [name] Tauzin MRN: [id number] DOB/AGE: 58/09/1955 58 y.o.  Admit date: 09/07/2014 Discharge date: 09/08/2014  Admission Diagnoses: Basilar aneurysm  Discharge Diagnoses: Same Active Problems:   Cerebral aneurysm   Discharged Condition: Stable  Hospital Course:  Mrs. [name] Nantz is a 59 y.o. 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 She was at neurologic baseline post-procedure and the next morning. She was tolerating diet, had minimal HA, and was ambulating without difficulty. She was therefore stable for discharge.  Treatments: Surgery - Stent-supported coil embolization of basilar aneurysm  Discharge Exam: Blood pressure 104/67, pulse 74, temperature 98.1 F (36.7 C), temperature source Oral, resp. rate 23, height 5\' 4"  (1.626 m), weight 98.3 kg (216 lb 11.4 oz), SpO2 95 %. Awake, alert, oriented Speech fluent, appropriate CN grossly intact 5/5 BUE/BLE Wound c/d/i, groin soft  Follow-up: Follow-up in my office [facility] [phone]) in 4 weeks  Disposition: 01-Home or Self Care     Medication List    STOP taking these medications        aspirin EC 81 MG tablet     HYDROcodone-acetaminophen 5-325 MG per tablet  Commonly known as:  NORCO/VICODIN      TAKE these medications        BC HEADACHE POWDER PO  Take 1 packet by mouth daily as needed (pain).     clopidogrel 75 MG tablet  Commonly known as:  PLAVIX  Take 75 mg by mouth daily.     insulin aspart 100 UNIT/ML FlexPen  Commonly known as:  NOVOLOG  Inject 8 Units into the skin 4 (four) times daily.     Insulin Glargine 100 UNIT/ML Solostar Pen  Commonly known as:  LANTUS  Inject 20 Units into the skin at bedtime.     metFORMIN 500 MG tablet  Commonly known as:  GLUCOPHAGE  Take 2 tablets (1,000 mg total) by mouth 2 (two) times daily with a meal.  Start taking on:   09/09/2014     naproxen 500 MG tablet  Commonly known as:  NAPROSYN  Take 1 tablet (500 mg total) by mouth 2 (two) times daily with a meal.     olmesartan-hydrochlorothiazide 20-12.5 MG per tablet  Commonly known as:  BENICAR HCT  Take 1 tablet by mouth daily.     oxyCODONE-acetaminophen 5-325 MG per tablet  Commonly known as:  PERCOCET/ROXICET  Take 1 tablet by mouth every 4 (four) hours as needed.     rosuvastatin 20 MG tablet  Commonly known as:  CRESTOR  Take 20 mg by mouth daily.     venlafaxine XR 75 MG 24 hr capsule  Commonly known as:  EFFEXOR-XR  Take 75 mg by mouth daily with breakfast.         Signed: Atiba Kimberlin, C 09/08/2014, 9:38 AM

## 2014-09-27 ENCOUNTER — Emergency Department (HOSPITAL_COMMUNITY)
Admission: EM | Admit: 2014-09-27 | Discharge: 2014-09-27 | Disposition: A | Discharge status: Home / Self Care | Payer: Disability Insurance | Attending: Emergency Medicine | Admitting: Emergency Medicine

## 2014-09-27 ENCOUNTER — Emergency Department (HOSPITAL_COMMUNITY): Payer: Disability Insurance

## 2014-09-27 ENCOUNTER — Encounter (HOSPITAL_COMMUNITY): Payer: Self-pay | Admitting: Emergency Medicine

## 2014-09-27 DIAGNOSIS — Z79899 Other long term (current) drug therapy: Secondary | ICD-10-CM | POA: Insufficient documentation

## 2014-09-27 DIAGNOSIS — Z720 Tobacco use: Secondary | ICD-10-CM | POA: Insufficient documentation

## 2014-09-27 DIAGNOSIS — Z791 Long term (current) use of non-steroidal anti-inflammatories (NSAID): Secondary | ICD-10-CM | POA: Insufficient documentation

## 2014-09-27 DIAGNOSIS — Z7982 Long term (current) use of aspirin: Secondary | ICD-10-CM | POA: Insufficient documentation

## 2014-09-27 DIAGNOSIS — Z7902 Long term (current) use of antithrombotics/antiplatelets: Secondary | ICD-10-CM | POA: Insufficient documentation

## 2014-09-27 DIAGNOSIS — I10 Essential (primary) hypertension: Secondary | ICD-10-CM | POA: Insufficient documentation

## 2014-09-27 DIAGNOSIS — E119 Type 2 diabetes mellitus without complications: Secondary | ICD-10-CM | POA: Insufficient documentation

## 2014-09-27 DIAGNOSIS — M79642 Pain in left hand: Secondary | ICD-10-CM | POA: Insufficient documentation

## 2014-09-27 DIAGNOSIS — R519 Headache, unspecified: Secondary | ICD-10-CM

## 2014-09-27 DIAGNOSIS — Z794 Long term (current) use of insulin: Secondary | ICD-10-CM | POA: Insufficient documentation

## 2014-09-27 DIAGNOSIS — F329 Major depressive disorder, single episode, unspecified: Secondary | ICD-10-CM | POA: Insufficient documentation

## 2014-09-27 DIAGNOSIS — R51 Headache: Secondary | ICD-10-CM | POA: Insufficient documentation

## 2014-09-27 DIAGNOSIS — E785 Hyperlipidemia, unspecified: Secondary | ICD-10-CM | POA: Insufficient documentation

## 2014-09-27 DIAGNOSIS — Z8701 Personal history of pneumonia (recurrent): Secondary | ICD-10-CM | POA: Insufficient documentation

## 2014-09-27 DIAGNOSIS — M1990 Unspecified osteoarthritis, unspecified site: Secondary | ICD-10-CM | POA: Insufficient documentation

## 2014-09-27 MED ORDER — PROMETHAZINE HCL 25 MG/ML IJ SOLN
25.0000 mg | Freq: Once | INTRAMUSCULAR | Status: AC
  Administered 2014-09-27: 25 mg via INTRAMUSCULAR
  Filled 2014-09-27: qty 1

## 2014-09-27 NOTE — ED Notes (Signed)
Pt reports she has a brain anuerysm behind her R eye, is supposed to see the specialist on 4/19. Pt c/o nosebleeds and headaches. Pt also has edema to her L hand. Is on anticoags.

## 2014-09-27 NOTE — ED Provider Notes (Signed)
CSN: [id number]     Arrival date & time 09/27/14  1431 History   First MD Initiated Contact with Patient 09/27/14 1529     Chief Complaint  Patient presents with  . Headache     (Consider location/radiation/quality/duration/timing/severity/associated sxs/prior Treatment) Patient is a 59 y.o. female presenting with headaches. The history is provided by the patient.  Headache Associated symptoms: no abdominal pain, no back pain, no diarrhea, no eye pain, no nausea, no neck stiffness, no numbness, no vomiting and no weakness    patient presents as a throbbing headache behind her right eye. States this is similar headache to what she's been having. States she has an aneurysm there. Also states that she had an aneurysm is not recently got fixed. States the pain started couple days ago. States is somewhat different than her typical pain but similar. No numbness or weakness. Also complaining of pain in her left hand. States she prior never followed up because Dr. expected money to treat her. States she has follow-up with her neurosurgeon in a week. She states she has had Percocet for the headache and had pain in the past. No fevers.  Past Medical History  Diagnosis Date  . Type 2 diabetes mellitus   . Essential hypertension, benign   . Mixed hyperlipidemia   . Diabetes mellitus   . Arthritis     degenerative lumbar spine   . Pneumonia 2014    [facility]- admitted for [facility] days  . Depression   . Headache     headache- R side of head  . H/O cardiovascular stress test 2012    [facility]   Past Surgical History  Procedure Laterality Date  . Umbilical hernia repair  2008  . Bilateral tubal ligation    . Radiology with anesthesia N/A 09/07/2014    Procedure: Embolization Stent Supported Coil of Aneurysm;  Surgeon: Lisbeth RenshawNeelesh Nundkumar, MD;  [facility];  Service: Radiology;  Laterality: N/A;   Family History  Problem Relation Age of Onset  . Cancer Father   . COPD Mother    History  Substance Use Topics   . Smoking status: Current Every Day Smoker -- 1.00 packs/day for 40 years    Types: Cigarettes  . Smokeless tobacco: Never Used  . Alcohol Use: No   OB History    Gravida Para Term Preterm AB TAB SAB Ectopic Multiple Living   2 2 2             Review of Systems  Constitutional: Negative for activity change and appetite change.  Eyes: Negative for pain.  Respiratory: Negative for chest tightness and shortness of breath.   Cardiovascular: Negative for chest pain and leg swelling.  Gastrointestinal: Negative for nausea, vomiting, abdominal pain and diarrhea.  Genitourinary: Negative for flank pain.  Musculoskeletal: Negative for back pain and neck stiffness.       Left hand pain.  Skin: Negative for rash.  Neurological: Positive for headaches. Negative for weakness and numbness.  Psychiatric/Behavioral: Negative for behavioral problems.      Allergies  Review of patient's allergies indicates no known allergies.  Home Medications   Prior to Admission medications   Medication Sig Start Date End Date Taking? Authorizing Provider  aspirin EC 81 MG tablet Take 81 mg by mouth daily.   Yes Historical Provider, MD  clopidogrel (PLAVIX) 75 MG tablet Take 75 mg by mouth daily.   Yes Historical Provider, MD  insulin aspart (NOVOLOG) 100 UNIT/ML FlexPen Inject 8 Units into the skin  4 (four) times daily.   Yes Historical Provider, MD  Insulin Glargine (LANTUS) 100 UNIT/ML Solostar Pen Inject 20 Units into the skin at bedtime.    Yes Historical Provider, MD  metFORMIN (GLUCOPHAGE) 500 MG tablet Take 2 tablets (1,000 mg total) by mouth 2 (two) times daily with a meal. 09/09/14  Yes Lisbeth Renshaw, MD  olmesartan-hydrochlorothiazide (BENICAR HCT) 20-12.5 MG per tablet Take 1 tablet by mouth daily.   Yes Historical Provider, MD  rosuvastatin (CRESTOR) 20 MG tablet Take 20 mg by mouth daily.   Yes Historical Provider, MD  venlafaxine XR (EFFEXOR-XR) 75 MG 24 hr capsule Take 75 mg by mouth daily  with breakfast.   Yes Historical Provider, MD  naproxen (NAPROSYN) 500 MG tablet Take 1 tablet (500 mg total) by mouth 2 (two) times daily with a meal. Patient not taking: Reported on 08/30/2014 08/13/14   Tammi Triplett, PA-C  oxyCODONE-acetaminophen (PERCOCET/ROXICET) 5-325 MG per tablet Take 1 tablet by mouth every 4 (four) hours as needed. Patient not taking: Reported on 09/27/2014 08/17/14   Dione Booze, MD  oxyCODONE-acetaminophen (ROXICET) 5-325 MG per tablet Take 1 tablet by mouth every 6 (six) hours as needed for severe pain. Patient not taking: Reported on 09/27/2014 09/08/14   Coletta Memos, MD   BP 118/62 mmHg  Pulse 70  Temp(Src) 97.5 F (36.4 C) (Oral)  Resp 18  Ht  (1.626 m)  Wt 213 lb (96.616 kg)  BMI 36.54 kg/m2  SpO2 100% Physical Exam  Constitutional: She appears well-developed and well-nourished.  HENT:  Head: Normocephalic.  Cardiovascular: Normal rate.   Pulmonary/Chest: Effort normal.  Abdominal: Soft.  Musculoskeletal: Normal range of motion.  Some contractures in left hand, chronic.  Neurological: She is alert.  Skin: Skin is warm.    ED Course  Procedures (including critical care time) Labs Review Labs Reviewed - No data to display  Imaging Review Ct Head Wo Contrast  09/27/2014   CLINICAL DATA:  Headache around right eye today, no known injury, history of aneurysm repair 2 weeks ago 06/09/2014  EXAM: CT HEAD WITHOUT CONTRAST  TECHNIQUE: Contiguous axial images were obtained from the base of the skull through the vertex without intravenous contrast.  COMPARISON:  06/09/2014  FINDINGS: No intracranial hemorrhage, mass effect or midline shift. Paranasal sinuses and mastoid air cells are unremarkable. Postsurgical changes post basilar aneurysm clipping are noted midline. No intraventricular hemorrhage. Ventricular size is stable from prior exam. No acute cortical infarction. No mass lesion is noted on this unenhanced scan.  IMPRESSION: No acute intracranial  abnormality. Postsurgical changes post basilar aneurysm clipping.   Electronically Signed   By: Natasha Mead M.D.   On: 09/27/2014 16:36     EKG Interpretation None      MDM   Final diagnoses:  Nonintractable headache, unspecified chronicity pattern, unspecified headache type    Patient with headache. Known aneurysm. Feels better after treatment. Head CT shows no bleed. Does not appear to need lumbar puncture at this time. Will discharge home to follow-up with neurosurgery.    Benjiman Core, MD 09/27/14 2232

## 2014-09-27 NOTE — Discharge Instructions (Signed)

## 2014-10-17 ENCOUNTER — Emergency Department (HOSPITAL_COMMUNITY)
Admission: EM | Admit: 2014-10-17 | Discharge: 2014-10-17 | Disposition: A | Discharge status: Home / Self Care | Payer: Disability Insurance | Attending: Emergency Medicine | Admitting: Emergency Medicine

## 2014-10-17 ENCOUNTER — Encounter (HOSPITAL_COMMUNITY): Payer: Self-pay | Admitting: Emergency Medicine

## 2014-10-17 DIAGNOSIS — M47896 Other spondylosis, lumbar region: Secondary | ICD-10-CM | POA: Insufficient documentation

## 2014-10-17 DIAGNOSIS — Z791 Long term (current) use of non-steroidal anti-inflammatories (NSAID): Secondary | ICD-10-CM | POA: Insufficient documentation

## 2014-10-17 DIAGNOSIS — E119 Type 2 diabetes mellitus without complications: Secondary | ICD-10-CM | POA: Insufficient documentation

## 2014-10-17 DIAGNOSIS — Z8701 Personal history of pneumonia (recurrent): Secondary | ICD-10-CM | POA: Insufficient documentation

## 2014-10-17 DIAGNOSIS — Z7982 Long term (current) use of aspirin: Secondary | ICD-10-CM | POA: Insufficient documentation

## 2014-10-17 DIAGNOSIS — F329 Major depressive disorder, single episode, unspecified: Secondary | ICD-10-CM | POA: Insufficient documentation

## 2014-10-17 DIAGNOSIS — Z7902 Long term (current) use of antithrombotics/antiplatelets: Secondary | ICD-10-CM | POA: Insufficient documentation

## 2014-10-17 DIAGNOSIS — Z79899 Other long term (current) drug therapy: Secondary | ICD-10-CM | POA: Insufficient documentation

## 2014-10-17 DIAGNOSIS — Z720 Tobacco use: Secondary | ICD-10-CM | POA: Insufficient documentation

## 2014-10-17 DIAGNOSIS — Z794 Long term (current) use of insulin: Secondary | ICD-10-CM | POA: Insufficient documentation

## 2014-10-17 DIAGNOSIS — E782 Mixed hyperlipidemia: Secondary | ICD-10-CM | POA: Insufficient documentation

## 2014-10-17 DIAGNOSIS — G8929 Other chronic pain: Secondary | ICD-10-CM | POA: Insufficient documentation

## 2014-10-17 DIAGNOSIS — I10 Essential (primary) hypertension: Secondary | ICD-10-CM | POA: Insufficient documentation

## 2014-10-17 MED ORDER — KETOROLAC TROMETHAMINE 60 MG/2ML IM SOLN
30.0000 mg | Freq: Once | INTRAMUSCULAR | Status: AC
  Administered 2014-10-17: 30 mg via INTRAMUSCULAR
  Filled 2014-10-17: qty 2

## 2014-10-17 NOTE — ED Provider Notes (Signed)
CSN: [id number]     Arrival date & time 10/17/14  0000 History   First MD Initiated Contact with Patient 10/17/14 0139     Chief Complaint  Patient presents with  . Hand Pain     (Consider location/radiation/quality/duration/timing/severity/associated sxs/prior Treatment) Patient is a 59 y.o. female presenting with hand pain. The history is provided by the patient.  Hand Pain   [name] is a 59 y.o. female who presents to the ED with left hand pain she was evaluated after she injured it in Feb and had fractures of the second, third and fourth fingers. Patient reports that she could never follow up with an orthopedic doctor because she did not have insurance. She is in the process of getting Medicaid and disability. She states she is going to have surgery by Dr. Jule SerNudleman  for an aneurysm behind her right eye. She is here for pain management until she can get her disability and be able to see the doctors she needs to see. Patient has chronic pain. She is asking for a shot and something stronger than hydrocodone.   Past Medical History  Diagnosis Date  . Type 2 diabetes mellitus   . Essential hypertension, benign   . Mixed hyperlipidemia   . Diabetes mellitus   . Arthritis     degenerative lumbar spine   . Pneumonia 2014    [facility]- admitted for [facility] days  . Depression   . Headache     headache- R side of head  . H/O cardiovascular stress test 2012    [facility]   Past Surgical History  Procedure Laterality Date  . Umbilical hernia repair  2008  . Bilateral tubal ligation    . Radiology with anesthesia N/A 09/07/2014    Procedure: Embolization Stent Supported Coil of Aneurysm;  Surgeon: Lisbeth RenshawNeelesh Nundkumar, MD;  [facility];  Service: Radiology;  Laterality: N/A;   Family History  Problem Relation Age of Onset  . Cancer Father   . COPD Mother    History  Substance Use Topics  . Smoking status: Current Every Day Smoker -- 1.00 packs/day for 40 years    Types: Cigarettes  .  Smokeless tobacco: Never Used  . Alcohol Use: No   OB History    Gravida Para Term Preterm AB TAB SAB Ectopic Multiple Living   2 2 2             Review of Systems Negative except as stated in HPI   Allergies  Review of patient's allergies indicates no known allergies.  Home Medications   Prior to Admission medications   Medication Sig Start Date End Date Taking? Authorizing Provider  aspirin EC 81 MG tablet Take 81 mg by mouth daily.    Historical Provider, MD  clopidogrel (PLAVIX) 75 MG tablet Take 75 mg by mouth daily.    Historical Provider, MD  insulin aspart (NOVOLOG) 100 UNIT/ML FlexPen Inject 8 Units into the skin 4 (four) times daily.    Historical Provider, MD  Insulin Glargine (LANTUS) 100 UNIT/ML Solostar Pen Inject 20 Units into the skin at bedtime.     Historical Provider, MD  metFORMIN (GLUCOPHAGE) 500 MG tablet Take 2 tablets (1,000 mg total) by mouth 2 (two) times daily with a meal. 09/09/14   Lisbeth RenshawNeelesh Nundkumar, MD  naproxen (NAPROSYN) 500 MG tablet Take 1 tablet (500 mg total) by mouth 2 (two) times daily with a meal. Patient not taking: Reported on 08/30/2014 08/13/14   Pauline Ausammy Triplett, PA-C  olmesartan-hydrochlorothiazide (BENICAR HCT) 20-12.5 MG per tablet Take 1 tablet by mouth daily.    Historical Provider, MD  oxyCODONE-acetaminophen (PERCOCET/ROXICET) 5-325 MG per tablet Take 1 tablet by mouth every 4 (four) hours as needed. Patient not taking: Reported on 09/27/2014 08/17/14   Dione Booze, MD  oxyCODONE-acetaminophen (ROXICET) 5-325 MG per tablet Take 1 tablet by mouth every 6 (six) hours as needed for severe pain. Patient not taking: Reported on 09/27/2014 09/08/14   Coletta Memos, MD  rosuvastatin (CRESTOR) 20 MG tablet Take 20 mg by mouth daily.    Historical Provider, MD  venlafaxine XR (EFFEXOR-XR) 75 MG 24 hr capsule Take 75 mg by mouth daily with breakfast.    Historical Provider, MD   BP 144/81 mmHg  Pulse 76  Temp(Src) 98.2 F (36.8 C)  Resp 18  Ht   (1.626 m)  Wt 218 lb (98.884 kg)  BMI 37.40 kg/m2  SpO2 99% Physical Exam  Constitutional: She is oriented to person, place, and time. She appears well-developed and well-nourished.  Eyes: EOM are normal.  Neck: Neck supple.  Cardiovascular: Normal rate.   Pulmonary/Chest: Effort normal.  Musculoskeletal: Normal range of motion.       Left hand: She exhibits tenderness and swelling. Normal sensation noted. Normal strength noted.  There is swelling of the fingers of the left hand, there is tenderness on exam. Radial pulses 2+ bilateral. Adequate circulation, good touch sensation.   Neurological: She is alert and oriented to person, place, and time. No cranial nerve deficit.  Skin: Skin is warm and dry.  Psychiatric: She has a normal mood and affect.  Nursing note and vitals reviewed.   ED Course  Procedures (including critical care time)  Review of the [place] narcotic data base shows that the patient had 60 hydrocodone one week ago and she had 80 Percocet prescribed one month prior to that.    MDM  59 y.o. female with chronic pain that she reports not resolved with hydrocodone. Discussed with the patient that she will need to follow up with her PCP for her chronic pain and need for f/u with an orthopedic doctor for your hand problem. Stable for d/c without neurovascular compromise.   Final diagnoses:  Chronic pain        Janne Napoleon, NP 10/17/14 0215  Dione Booze, MD 10/17/14 [phone]

## 2014-10-17 NOTE — ED Notes (Signed)
Patient verbalizes understanding of discharge instructions, home care and follow up care. Patient ambulatory out of department at this time. 

## 2014-10-17 NOTE — Discharge Instructions (Signed)
Your records show that you received a prescription for 60 hydrocodone tablets one week ago. It also shows that the month prior to that you had a prescription for 80 Percocet. You will need to see your primary care doctor or your Neurosurgeon for further treatment of your chronic pain.

## 2014-10-17 NOTE — ED Notes (Signed)
Patient states she originally injured left hand 06-09-14. Patient c/o pain to left hand. Patient denies new injury.

## 2014-10-17 NOTE — ED Notes (Signed)
Pt c/o left hand since falling a month ago.

## 2015-01-05 ENCOUNTER — Other Ambulatory Visit: Payer: Self-pay | Admitting: Neurosurgery

## 2015-01-29 ENCOUNTER — Ambulatory Visit (HOSPITAL_COMMUNITY)
Admission: RE | Admit: 2015-01-29 | Discharge: 2015-01-29 | Disposition: A | Discharge status: Home / Self Care | Payer: Medicaid Other | Source: Ambulatory Visit | Attending: Family Medicine | Admitting: Family Medicine

## 2015-01-29 ENCOUNTER — Other Ambulatory Visit (HOSPITAL_COMMUNITY): Payer: Self-pay | Admitting: Family Medicine

## 2015-01-29 ENCOUNTER — Encounter (HOSPITAL_COMMUNITY)
Admission: RE | Admit: 2015-01-29 | Discharge: 2015-01-29 | Disposition: A | Discharge status: Home / Self Care | Payer: Medicaid Other | Source: Ambulatory Visit | Attending: Neurosurgery | Admitting: Neurosurgery

## 2015-01-29 ENCOUNTER — Encounter (HOSPITAL_COMMUNITY): Payer: Self-pay

## 2015-01-29 DIAGNOSIS — S62603D Fracture of unspecified phalanx of left middle finger, subsequent encounter for fracture with routine healing: Secondary | ICD-10-CM

## 2015-01-29 DIAGNOSIS — G8929 Other chronic pain: Secondary | ICD-10-CM

## 2015-01-29 DIAGNOSIS — M79642 Pain in left hand: Secondary | ICD-10-CM | POA: Insufficient documentation

## 2015-01-29 DIAGNOSIS — M7989 Other specified soft tissue disorders: Secondary | ICD-10-CM | POA: Insufficient documentation

## 2015-01-29 DIAGNOSIS — S62605D Fracture of unspecified phalanx of left ring finger, subsequent encounter for fracture with routine healing: Secondary | ICD-10-CM

## 2015-01-29 DIAGNOSIS — W19XXXA Unspecified fall, initial encounter: Secondary | ICD-10-CM

## 2015-01-29 LAB — GLUCOSE, CAPILLARY: Glucose-Capillary: 306 mg/dL — ABNORMAL HIGH (ref 65–99)

## 2015-01-29 NOTE — Pre-Procedure Instructions (Signed)
[name]  01/29/2015      [facility] - [place], [place] - [address] [place] [address] Phone: [phone] Fax: [phone]    Your procedure is scheduled on 02/15/2015  Report to [facility] Admitting at 5:30 A.M.  Call this number if you have problems the morning of surgery:  [phone]   Remember:  Do not eat food or drink liquids after midnight.  On Wednesday   Take these medicines the morning of surgery with A SIP OF WATER: Venlafxine,   One half of the sliding scale in teh morning.  AND its OK to use Pain medicine if you need it   Do not wear jewelry, make-up or nail polish.   Do not wear lotions, powders, or perfumes.  You may wear deodorant.   Do not shave 48 hours prior to surgery.     Do not bring valuables to the hospital.   [facility] is not responsible for any belongings or valuables.  Contacts, dentures or bridgework may not be worn into surgery.  Leave your suitcase in the car.  After surgery it may be brought to your room.  For patients admitted to the hospital, discharge time will be determined by your treatment team.  Patients discharged the day of surgery will not be allowed to drive home.   Name and phone number of your driver:   /w family Special instructions:  Special Instructions: [facility] - Preparing for Surgery  Before surgery, you can play an important role.  Because skin is not sterile, your skin needs to be as free of germs as possible.  You can reduce the number of germs on you skin by washing with CHG (chlorahexidine gluconate) soap before surgery.  CHG is an antiseptic cleaner which kills germs and bonds with the skin to continue killing germs even after washing.  Please DO NOT use if you have an allergy to CHG or antibacterial soaps.  If your skin becomes reddened/irritated stop using the CHG and inform your nurse when you arrive at Short Stay.  Do not shave (including legs and  underarms) for at least 48 hours prior to the first CHG shower.  You may shave your face.  Please follow these instructions carefully:   1.  Shower with CHG Soap the night before surgery and the  morning of Surgery.  2.  If you choose to wash your hair, wash your hair first as usual with your  normal shampoo.  3.  After you shampoo, rinse your hair and body thoroughly to remove the  Shampoo.  4.  Use CHG as you would any other liquid soap.  You can apply chg directly to the skin and wash gently with scrungie or a clean washcloth.  5.  Apply the CHG Soap to your body ONLY FROM THE NECK DOWN.    Do not use on open wounds or open sores.  Avoid contact with your eyes, ears, mouth and genitals (private parts).  Wash genitals (private parts)   with your normal soap.  6.  Wash thoroughly, paying special attention to the area where your surgery will be performed.  7.  Thoroughly rinse your body with warm water from the neck down.  8.  DO NOT shower/wash with your normal soap after using and rinsing off   the CHG Soap.  9.  Pat yourself dry with a clean towel.  10.  Wear clean pajamas.            11.  Place clean sheets on your bed the night of your first shower and do not sleep with pets.  Day of Surgery  Do not apply any lotions/deodorants the morning of surgery.  Please wear clean clothes to the hospital/surgery center.  Please read over the following fact sheets that you were given. Pain Booklet, Coughing and Deep Breathing, Blood Transfusion Information and Surgical Site Infection Prevention    IN ADDITION- ATTENTION PLEASE How to Manage Your Diabetes Before Surgery   Why is it important to control my blood sugar before and after surgery?   Improving blood sugar levels before and after surgery helps healing and can limit problems.  A way of improving blood sugar control is eating a healthy diet by:  - Eating less sugar and carbohydrates  - Increasing activity/exercise  -  Talk with your doctor about reaching your blood sugar goals  High blood sugars (greater than 180 mg/dL) can raise your risk of infections and slow down your recovery so you will need to focus on controlling your diabetes during the weeks before surgery.  Make sure that the doctor who takes care of your diabetes knows about your planned surgery including the date and location.  How do I manage my blood sugars before surgery?   Check your blood sugar at least 4 times a day, 2 days before surgery to make sure that they are not too high or low.   Check your blood sugar the morning of your surgery when you wake up and every 2               hours until you get to the Short-Stay unit.  If your blood sugar is less than 70 mg/dL, you will need to treat for low blood sugar by:  Treat a low blood sugar (less than 70 mg/dL) with 1/2 cup of clear juice (cranberry or apple), 4 glucose tablets, OR glucose gel.  Recheck blood sugar in 15 minutes after treatment (to make sure it is greater than 70 mg/dL).  If blood sugar is not greater than 70 mg/dL on re-check, call [phone] for further instructions.   Report your blood sugar to the Short-Stay nurse when you get to Short-Stay.  References:  [facility], 2007 "How to Manage your Diabetes Before and After Surgery".  What do I do about my diabetes medications?   Do not take oral diabetes medicines (pills) the morning of surgery.    THE NIGHT BEFORE SURGERY, take 16 units of LANTUS Insulin.    THE MORNING OF SURGERY, take 4 units of SLIDING   SCALE Insulin.   (NOVOLOG)...........ONLY IF THE MORNING BLOOD SUGAR is GREATER THAN 220    Do not take other diabetes injectables the day of surgery including Byetta, Victoza, Bydureon, and Trulicity.    If your CBG is greater than 220 mg/dL, you may take 1/2 of your sliding scale (correction) dose of insulin.

## 2015-02-05 ENCOUNTER — Other Ambulatory Visit (HOSPITAL_COMMUNITY): Payer: Self-pay | Admitting: Family Medicine

## 2015-02-05 DIAGNOSIS — Z1231 Encounter for screening mammogram for malignant neoplasm of breast: Secondary | ICD-10-CM

## 2015-02-07 ENCOUNTER — Encounter (HOSPITAL_COMMUNITY)
Admission: RE | Admit: 2015-02-07 | Discharge: 2015-02-07 | Disposition: A | Discharge status: Home / Self Care | Payer: Medicaid Other | Source: Ambulatory Visit | Attending: Neurosurgery | Admitting: Neurosurgery

## 2015-02-07 DIAGNOSIS — I10 Essential (primary) hypertension: Secondary | ICD-10-CM | POA: Diagnosis not present

## 2015-02-07 DIAGNOSIS — Z794 Long term (current) use of insulin: Secondary | ICD-10-CM | POA: Insufficient documentation

## 2015-02-07 DIAGNOSIS — Z7982 Long term (current) use of aspirin: Secondary | ICD-10-CM | POA: Diagnosis not present

## 2015-02-07 DIAGNOSIS — F17200 Nicotine dependence, unspecified, uncomplicated: Secondary | ICD-10-CM | POA: Diagnosis not present

## 2015-02-07 DIAGNOSIS — Z01812 Encounter for preprocedural laboratory examination: Secondary | ICD-10-CM | POA: Diagnosis not present

## 2015-02-07 DIAGNOSIS — Z0183 Encounter for blood typing: Secondary | ICD-10-CM | POA: Insufficient documentation

## 2015-02-07 DIAGNOSIS — Z01818 Encounter for other preprocedural examination: Secondary | ICD-10-CM | POA: Diagnosis not present

## 2015-02-07 DIAGNOSIS — E785 Hyperlipidemia, unspecified: Secondary | ICD-10-CM | POA: Diagnosis not present

## 2015-02-07 DIAGNOSIS — I671 Cerebral aneurysm, nonruptured: Secondary | ICD-10-CM | POA: Insufficient documentation

## 2015-02-07 DIAGNOSIS — E119 Type 2 diabetes mellitus without complications: Secondary | ICD-10-CM | POA: Insufficient documentation

## 2015-02-07 DIAGNOSIS — Z79899 Other long term (current) drug therapy: Secondary | ICD-10-CM | POA: Diagnosis not present

## 2015-02-07 DIAGNOSIS — Z7902 Long term (current) use of antithrombotics/antiplatelets: Secondary | ICD-10-CM | POA: Insufficient documentation

## 2015-02-07 LAB — BASIC METABOLIC PANEL
Anion gap: 11 (ref 5–15)
BUN: 14 mg/dL (ref 6–20)
CO2: 21 mmol/L — ABNORMAL LOW (ref 22–32)
Calcium: 9.4 mg/dL (ref 8.9–10.3)
Chloride: 102 mmol/L (ref 101–111)
Creatinine, Ser: 0.79 mg/dL (ref 0.44–1.00)
GFR calc Af Amer: >60 mL/min (ref >60)
GFR calc non Af Amer: >60 mL/min (ref >60)
Glucose, Bld: 255 mg/dL — ABNORMAL HIGH (ref 65–99)
Potassium: 4.1 mmol/L (ref 3.5–5.1)
Sodium: 134 mmol/L — ABNORMAL LOW (ref 135–145)

## 2015-02-07 LAB — CBC
HCT: 38.6 % (ref 36.0–46.0)
Hemoglobin: 13.3 g/dL (ref 12.0–15.0)
MCH: 30 pg (ref 26.0–34.0)
MCHC: 34.5 g/dL (ref 30.0–36.0)
MCV: 86.9 fL (ref 78.0–100.0)
Platelets: 163 10*3/uL (ref 150–400)
RBC: 4.44 MIL/uL (ref 3.87–5.11)
RDW: 13 % (ref 11.5–15.5)
WBC: 6.5 10*3/uL (ref 4.0–10.5)

## 2015-02-07 LAB — GLUCOSE, CAPILLARY: Glucose-Capillary: 269 mg/dL — ABNORMAL HIGH (ref 65–99)

## 2015-02-07 LAB — ABO/RH: ABO/RH(D): O NEG

## 2015-02-07 NOTE — Progress Notes (Signed)
Pt's blood sugar 269 when patient arrived for blood work at [employer] appointment.  Blood sugar reported to [place], [place].  Pt. States that she thinks her fasting blood sugar is between 200-300 but was unable to give an exact number.

## 2015-02-08 ENCOUNTER — Ambulatory Visit (HOSPITAL_COMMUNITY): Payer: Medicaid Other

## 2015-02-08 LAB — HEMOGLOBIN A1C
Hgb A1c MFr Bld: 11.2 % — ABNORMAL HIGH (ref 4.8–5.6)
Mean Plasma Glucose: 275 mg/dL

## 2015-02-08 NOTE — Progress Notes (Signed)
Anesthesia Chart Review:   Pt is 59 year old female scheduled for R frontotemporal craniotomy for aneurysm clipping on 02/15/2015 with Dr. Conchita Paris.   PCP is Dr. Foster Simpson.  History includes DM2, HTN, HLD, depression.  She fell in 05/2014 and CT of the head showed an incidental findings most consistent with cerebral aneurysm. 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 Current smoker. BMI 37. S/p embolization stent supported coil of aneurysm 09/07/14.   She is not routinely followed by cardiology, but saw Dr. McDowell/Dr. Dietrich Pates during a hospitalization for chest pain in 2012 and had a non-ischemic stress test.   Meds include ASA, Plavix, Norco, Novolog, Lantus, metformin, Benicar HCT, Crestor, Effexor XR.   Preoperative labs reviewed.  HgbA1c 11.2, glucose 255.   CXR 08/30/14: No acute cardiopulmonary disease.  08/30/14 EKG: NSR, minimal voltage criteria for LVH, may be normal variant, non-specific ST/T wave abnormality. No significant change since last tracing.   04/17/11 Nuclear stress test: Negative stress nuclear myocardial study revealing somewhat impaired exercise capacity, no stress induced EKG abnormalities, normal left ventricular size and normal left ventricular systolic function. LVEF > 65%. By scintigraphic imaging, there was normal myocardial perfusion without evidence for ischemia or infarction.   Rica Mast, FNP-BC [facility]/Anesthesiology Phone: [phone] 02/08/2015 4:58 PM

## 2015-02-12 ENCOUNTER — Ambulatory Visit (HOSPITAL_COMMUNITY): Payer: Medicaid Other

## 2015-02-14 ENCOUNTER — Other Ambulatory Visit (HOSPITAL_COMMUNITY): Payer: Self-pay | Admitting: Family Medicine

## 2015-02-14 ENCOUNTER — Ambulatory Visit (HOSPITAL_COMMUNITY)
Admission: RE | Admit: 2015-02-14 | Discharge: 2015-02-14 | Disposition: A | Discharge status: Home / Self Care | Payer: Medicaid Other | Source: Ambulatory Visit | Attending: Family Medicine | Admitting: Family Medicine

## 2015-02-14 DIAGNOSIS — Z1231 Encounter for screening mammogram for malignant neoplasm of breast: Secondary | ICD-10-CM | POA: Insufficient documentation

## 2015-02-14 DIAGNOSIS — Z87891 Personal history of nicotine dependence: Secondary | ICD-10-CM

## 2015-02-14 DIAGNOSIS — Z720 Tobacco use: Secondary | ICD-10-CM | POA: Insufficient documentation

## 2015-02-14 DIAGNOSIS — I10 Essential (primary) hypertension: Secondary | ICD-10-CM | POA: Insufficient documentation

## 2015-02-14 DIAGNOSIS — E119 Type 2 diabetes mellitus without complications: Secondary | ICD-10-CM | POA: Diagnosis not present

## 2015-02-14 MED ORDER — CEFAZOLIN SODIUM-DEXTROSE 2-3 GM-% IV SOLR
2.0000 g | INTRAVENOUS | Status: AC
  Administered 2015-02-15 (×2): 2 g via INTRAVENOUS
  Filled 2015-02-14: qty 50

## 2015-02-15 ENCOUNTER — Encounter (HOSPITAL_COMMUNITY)
Admission: RE | Disposition: A | Discharge status: Home / Self Care | Payer: Self-pay | Source: Ambulatory Visit | Attending: Neurosurgery

## 2015-02-15 ENCOUNTER — Encounter (HOSPITAL_COMMUNITY): Payer: Self-pay | Admitting: Surgery

## 2015-02-15 ENCOUNTER — Inpatient Hospital Stay (HOSPITAL_COMMUNITY)
Admission: RE | Admit: 2015-02-15 | Discharge: 2015-02-19 | DRG: 027 | Disposition: A | Discharge status: Home / Self Care | Payer: Medicaid Other | Source: Ambulatory Visit | Attending: Neurosurgery | Admitting: Neurosurgery

## 2015-02-15 ENCOUNTER — Inpatient Hospital Stay (HOSPITAL_COMMUNITY): Payer: Medicaid Other | Admitting: Emergency Medicine

## 2015-02-15 ENCOUNTER — Inpatient Hospital Stay (HOSPITAL_COMMUNITY): Payer: Medicaid Other | Admitting: Anesthesiology

## 2015-02-15 DIAGNOSIS — Z7902 Long term (current) use of antithrombotics/antiplatelets: Secondary | ICD-10-CM | POA: Diagnosis not present

## 2015-02-15 DIAGNOSIS — Z794 Long term (current) use of insulin: Secondary | ICD-10-CM

## 2015-02-15 DIAGNOSIS — Z79891 Long term (current) use of opiate analgesic: Secondary | ICD-10-CM | POA: Diagnosis not present

## 2015-02-15 DIAGNOSIS — I671 Cerebral aneurysm, nonruptured: Principal | ICD-10-CM | POA: Diagnosis present

## 2015-02-15 DIAGNOSIS — I739 Peripheral vascular disease, unspecified: Secondary | ICD-10-CM | POA: Diagnosis present

## 2015-02-15 DIAGNOSIS — F17210 Nicotine dependence, cigarettes, uncomplicated: Secondary | ICD-10-CM | POA: Diagnosis present

## 2015-02-15 DIAGNOSIS — E782 Mixed hyperlipidemia: Secondary | ICD-10-CM | POA: Diagnosis present

## 2015-02-15 DIAGNOSIS — F329 Major depressive disorder, single episode, unspecified: Secondary | ICD-10-CM | POA: Diagnosis present

## 2015-02-15 DIAGNOSIS — Z7982 Long term (current) use of aspirin: Secondary | ICD-10-CM | POA: Diagnosis not present

## 2015-02-15 DIAGNOSIS — Z79899 Other long term (current) drug therapy: Secondary | ICD-10-CM

## 2015-02-15 DIAGNOSIS — E119 Type 2 diabetes mellitus without complications: Secondary | ICD-10-CM | POA: Diagnosis present

## 2015-02-15 DIAGNOSIS — I10 Essential (primary) hypertension: Secondary | ICD-10-CM | POA: Diagnosis present

## 2015-02-15 HISTORY — PX: CRANIOTOMY: SHX93

## 2015-02-15 LAB — GLUCOSE, CAPILLARY
Glucose-Capillary: 200 mg/dL — ABNORMAL HIGH (ref 65–99)
Glucose-Capillary: 338 mg/dL — ABNORMAL HIGH (ref 65–99)
Glucose-Capillary: 375 mg/dL — ABNORMAL HIGH (ref 65–99)
Glucose-Capillary: 292 mg/dL — ABNORMAL HIGH (ref 65–99)
Glucose-Capillary: 259 mg/dL — ABNORMAL HIGH (ref 65–99)
Glucose-Capillary: 248 mg/dL — ABNORMAL HIGH (ref 65–99)

## 2015-02-15 LAB — POCT I-STAT 7, (LYTES, BLD GAS, ICA,H+H)
Acid-base deficit: 4 mmol/L — ABNORMAL HIGH (ref 0.0–2.0)
Acid-base deficit: 4 mmol/L — ABNORMAL HIGH (ref 0.0–2.0)
Bicarbonate: 21.7 mEq/L (ref 20.0–24.0)
Bicarbonate: 21.7 mEq/L (ref 20.0–24.0)
Calcium, Ion: 1.12 mmol/L (ref 1.12–1.23)
Calcium, Ion: 1.1 mmol/L — ABNORMAL LOW (ref 1.12–1.23)
HCT: 32 % — ABNORMAL LOW (ref 36.0–46.0)
HCT: 31 % — ABNORMAL LOW (ref 36.0–46.0)
Hemoglobin: 10.9 g/dL — ABNORMAL LOW (ref 12.0–15.0)
Hemoglobin: 10.5 g/dL — ABNORMAL LOW (ref 12.0–15.0)
O2 Saturation: 100 %
O2 Saturation: 100 %
Patient temperature: 35.3
Patient temperature: 36.1
Potassium: 4 mmol/L (ref 3.5–5.1)
Potassium: 4.4 mmol/L (ref 3.5–5.1)
Sodium: 138 mmol/L (ref 135–145)
Sodium: 140 mmol/L (ref 135–145)
TCO2: 23 mmol/L (ref 0–100)
TCO2: 23 mmol/L (ref 0–100)
pCO2 arterial: 37.8 mmHg (ref 35.0–45.0)
pCO2 arterial: 40.7 mmHg (ref 35.0–45.0)
pH, Arterial: 7.359 (ref 7.350–7.450)
pH, Arterial: 7.33 — ABNORMAL LOW (ref 7.350–7.450)
pO2, Arterial: 219 mmHg — ABNORMAL HIGH (ref 80.0–100.0)
pO2, Arterial: 189 mmHg — ABNORMAL HIGH (ref 80.0–100.0)

## 2015-02-15 LAB — POCT I-STAT 4, (NA,K, GLUC, HGB,HCT)
Glucose, Bld: 230 mg/dL — ABNORMAL HIGH (ref 65–99)
Glucose, Bld: 289 mg/dL — ABNORMAL HIGH (ref 65–99)
HCT: 32 % — ABNORMAL LOW (ref 36.0–46.0)
HCT: 34 % — ABNORMAL LOW (ref 36.0–46.0)
Hemoglobin: 10.9 g/dL — ABNORMAL LOW (ref 12.0–15.0)
Hemoglobin: 11.6 g/dL — ABNORMAL LOW (ref 12.0–15.0)
Potassium: 4.1 mmol/L (ref 3.5–5.1)
Potassium: 4.5 mmol/L (ref 3.5–5.1)
Sodium: 138 mmol/L (ref 135–145)
Sodium: 138 mmol/L (ref 135–145)

## 2015-02-15 LAB — PREPARE RBC (CROSSMATCH)

## 2015-02-15 LAB — MRSA PCR SCREENING: MRSA by PCR: NEGATIVE

## 2015-02-15 SURGERY — CRANIOTOMY INTRACRANIAL ANEURYSM FOR CAROTID
Anesthesia: General | Site: Head | Laterality: Right

## 2015-02-15 MED ORDER — PHENYLEPHRINE HCL 10 MG/ML IJ SOLN
10.0000 mg | INTRAVENOUS | Status: DC | PRN
  Administered 2015-02-15: 40 ug/min via INTRAVENOUS

## 2015-02-15 MED ORDER — HYDROCHLOROTHIAZIDE 12.5 MG PO CAPS
12.5000 mg | ORAL_CAPSULE | Freq: Every day | ORAL | Status: DC
  Administered 2015-02-15 – 2015-02-18 (×4): 12.5 mg via ORAL
  Filled 2015-02-15 (×4): qty 1

## 2015-02-15 MED ORDER — INDIGOTINDISULFONATE SODIUM 8 MG/ML IJ SOLN
INTRAMUSCULAR | Status: DC | PRN
  Administered 2015-02-15: 12.5 mg via INTRAVENOUS

## 2015-02-15 MED ORDER — MORPHINE SULFATE (PF) 2 MG/ML IV SOLN
1.0000 mg | INTRAVENOUS | Status: DC | PRN
  Administered 2015-02-15 – 2015-02-16 (×9): 2 mg via INTRAVENOUS
  Filled 2015-02-15 (×9): qty 1

## 2015-02-15 MED ORDER — GLYCOPYRROLATE 0.2 MG/ML IJ SOLN
INTRAMUSCULAR | Status: DC | PRN
  Administered 2015-02-15: 0.6 mg via INTRAVENOUS

## 2015-02-15 MED ORDER — EPHEDRINE SULFATE 50 MG/ML IJ SOLN
INTRAMUSCULAR | Status: AC
  Filled 2015-02-15: qty 2

## 2015-02-15 MED ORDER — 0.9 % SODIUM CHLORIDE (POUR BTL) OPTIME
TOPICAL | Status: DC | PRN
  Administered 2015-02-15 (×3): 1000 mL

## 2015-02-15 MED ORDER — INSULIN ASPART 100 UNIT/ML ~~LOC~~ SOLN
SUBCUTANEOUS | Status: AC
  Filled 2015-02-15: qty 1

## 2015-02-15 MED ORDER — ONDANSETRON HCL 4 MG PO TABS: 4.0000 mg | ORAL_TABLET | ORAL | Status: DC | PRN

## 2015-02-15 MED ORDER — INSULIN ASPART 100 UNIT/ML ~~LOC~~ SOLN
0.0000 [IU] | Freq: Three times a day (TID) | SUBCUTANEOUS | Status: DC
  Administered 2015-02-15: 15 [IU] via SUBCUTANEOUS
  Administered 2015-02-16 (×2): 7 [IU] via SUBCUTANEOUS
  Administered 2015-02-16 – 2015-02-18 (×8): 4 [IU] via SUBCUTANEOUS
  Administered 2015-02-19: 3 [IU] via SUBCUTANEOUS

## 2015-02-15 MED ORDER — ROSUVASTATIN CALCIUM 20 MG PO TABS
20.0000 mg | ORAL_TABLET | Freq: Every day | ORAL | Status: DC
  Administered 2015-02-15 – 2015-02-18 (×4): 20 mg via ORAL
  Filled 2015-02-15 (×5): qty 1

## 2015-02-15 MED ORDER — EPHEDRINE SULFATE 50 MG/ML IJ SOLN
INTRAMUSCULAR | Status: DC | PRN
  Administered 2015-02-15 (×4): 5 mg via INTRAVENOUS

## 2015-02-15 MED ORDER — BUPIVACAINE HCL 0.5 % IJ SOLN
INTRAMUSCULAR | Status: DC | PRN
  Administered 2015-02-15: 5 mL

## 2015-02-15 MED ORDER — PHENYLEPHRINE 40 MCG/ML (10ML) SYRINGE FOR IV PUSH (FOR BLOOD PRESSURE SUPPORT)
PREFILLED_SYRINGE | INTRAVENOUS | Status: AC
  Filled 2015-02-15: qty 20

## 2015-02-15 MED ORDER — LIDOCAINE HCL (CARDIAC) 20 MG/ML IV SOLN
INTRAVENOUS | Status: AC
  Filled 2015-02-15: qty 10

## 2015-02-15 MED ORDER — FENTANYL CITRATE (PF) 250 MCG/5ML IJ SOLN
INTRAMUSCULAR | Status: AC
  Filled 2015-02-15: qty 5

## 2015-02-15 MED ORDER — INSULIN ASPART 100 UNIT/ML ~~LOC~~ SOLN
4.0000 [IU] | Freq: Three times a day (TID) | SUBCUTANEOUS | Status: DC
  Administered 2015-02-16 – 2015-02-19 (×10): 4 [IU] via SUBCUTANEOUS

## 2015-02-15 MED ORDER — DOCUSATE SODIUM 100 MG PO CAPS
100.0000 mg | ORAL_CAPSULE | Freq: Two times a day (BID) | ORAL | Status: DC
  Administered 2015-02-15 – 2015-02-19 (×9): 100 mg via ORAL
  Filled 2015-02-15 (×10): qty 1

## 2015-02-15 MED ORDER — MANNITOL 25 % IV SOLN
12.5000 g | INTRAVENOUS | Status: DC
  Filled 2015-02-15: qty 50

## 2015-02-15 MED ORDER — ROCURONIUM BROMIDE 100 MG/10ML IV SOLN
INTRAVENOUS | Status: DC | PRN
  Administered 2015-02-15 (×3): 20 mg via INTRAVENOUS
  Administered 2015-02-15: 10 mg via INTRAVENOUS
  Administered 2015-02-15: 50 mg via INTRAVENOUS

## 2015-02-15 MED ORDER — LEVETIRACETAM 500 MG/5ML IV SOLN
500.0000 mg | Freq: Two times a day (BID) | INTRAVENOUS | Status: DC
  Administered 2015-02-15 – 2015-02-16 (×2): 500 mg via INTRAVENOUS
  Filled 2015-02-15 (×4): qty 5

## 2015-02-15 MED ORDER — THROMBIN 5000 UNITS EX SOLR
CUTANEOUS | Status: DC | PRN
  Administered 2015-02-15 (×2): 5 mL via TOPICAL

## 2015-02-15 MED ORDER — SODIUM CHLORIDE 0.9 % IV SOLN
1000.0000 mg | INTRAVENOUS | Status: DC
  Filled 2015-02-15: qty 10

## 2015-02-15 MED ORDER — ROCURONIUM BROMIDE 50 MG/5ML IV SOLN
INTRAVENOUS | Status: AC
  Filled 2015-02-15: qty 1

## 2015-02-15 MED ORDER — SUCCINYLCHOLINE CHLORIDE 20 MG/ML IJ SOLN
INTRAMUSCULAR | Status: AC
  Filled 2015-02-15: qty 1

## 2015-02-15 MED ORDER — LABETALOL HCL 5 MG/ML IV SOLN: 10.0000 mg | INTRAVENOUS | Status: DC | PRN

## 2015-02-15 MED ORDER — MANNITOL 25 % IV SOLN
25.0000 g | INTRAVENOUS | Status: DC
  Filled 2015-02-15: qty 100

## 2015-02-15 MED ORDER — SODIUM CHLORIDE 0.9 % IR SOLN
Status: DC | PRN
  Administered 2015-02-15: 500 mL

## 2015-02-15 MED ORDER — HYDROCODONE-ACETAMINOPHEN 5-325 MG PO TABS
1.0000 | ORAL_TABLET | Freq: Four times a day (QID) | ORAL | Status: DC | PRN
Start: 2015-02-15 — End: 2015-02-15

## 2015-02-15 MED ORDER — LIDOCAINE HCL (CARDIAC) 20 MG/ML IV SOLN
INTRAVENOUS | Status: DC | PRN
  Administered 2015-02-15: 60 mg via INTRAVENOUS

## 2015-02-15 MED ORDER — INSULIN ASPART 100 UNIT/ML FLEXPEN: 8.0000 [IU] | PEN_INJECTOR | Freq: Four times a day (QID) | SUBCUTANEOUS | Status: DC

## 2015-02-15 MED ORDER — ARTIFICIAL TEARS OP OINT
TOPICAL_OINTMENT | OPHTHALMIC | Status: DC | PRN
  Administered 2015-02-15: 1 via OPHTHALMIC

## 2015-02-15 MED ORDER — OLMESARTAN MEDOXOMIL-HCTZ 20-12.5 MG PO TABS: 1.0000 | ORAL_TABLET | Freq: Every day | ORAL | Status: DC

## 2015-02-15 MED ORDER — ARTIFICIAL TEARS OP OINT
TOPICAL_OINTMENT | OPHTHALMIC | Status: AC
  Filled 2015-02-15: qty 3.5

## 2015-02-15 MED ORDER — PROPOFOL 10 MG/ML IV BOLUS
INTRAVENOUS | Status: DC | PRN
  Administered 2015-02-15: 200 mg via INTRAVENOUS

## 2015-02-15 MED ORDER — MICROFIBRILLAR COLL HEMOSTAT EX PADS
MEDICATED_PAD | CUTANEOUS | Status: DC | PRN
  Administered 2015-02-15: 1 via TOPICAL

## 2015-02-15 MED ORDER — LIDOCAINE HCL (PF) 1 % IJ SOLN
INTRAMUSCULAR | Status: DC | PRN
  Administered 2015-02-15: 5 mL

## 2015-02-15 MED ORDER — HYDROMORPHONE HCL 1 MG/ML IJ SOLN: 0.2500 mg | INTRAMUSCULAR | Status: DC | PRN

## 2015-02-15 MED ORDER — DEXAMETHASONE SODIUM PHOSPHATE 4 MG/ML IJ SOLN
INTRAMUSCULAR | Status: AC
  Filled 2015-02-15: qty 1

## 2015-02-15 MED ORDER — SODIUM CHLORIDE 0.9 % IV SOLN
INTRAVENOUS | Status: DC
  Administered 2015-02-16: 04:00:00 via INTRAVENOUS

## 2015-02-15 MED ORDER — SENNA 8.6 MG PO TABS
1.0000 | ORAL_TABLET | Freq: Two times a day (BID) | ORAL | Status: DC
Start: 2015-02-15 — End: 2015-02-19
  Administered 2015-02-15 – 2015-02-19 (×9): 8.6 mg via ORAL
  Filled 2015-02-15 (×9): qty 1

## 2015-02-15 MED ORDER — SODIUM CHLORIDE 0.9 % IV SOLN
1000.0000 mg | INTRAVENOUS | Status: DC | PRN
  Administered 2015-02-15: 1000 mg via INTRAVENOUS

## 2015-02-15 MED ORDER — BISACODYL 10 MG RE SUPP: 10.0000 mg | Freq: Every day | RECTAL | Status: DC | PRN

## 2015-02-15 MED ORDER — SODIUM CHLORIDE 0.9 % IJ SOLN
INTRAMUSCULAR | Status: AC
  Filled 2015-02-15: qty 20

## 2015-02-15 MED ORDER — PHENYLEPHRINE HCL 10 MG/ML IJ SOLN
INTRAMUSCULAR | Status: DC | PRN
  Administered 2015-02-15 (×3): 80 ug via INTRAVENOUS

## 2015-02-15 MED ORDER — SODIUM CHLORIDE 0.9 % IV SOLN: 10.0000 mL/h | Freq: Once | INTRAVENOUS | Status: DC

## 2015-02-15 MED ORDER — PROMETHAZINE HCL 25 MG/ML IJ SOLN
6.2500 mg | INTRAMUSCULAR | Status: DC | PRN
Start: 2015-02-15 — End: 2015-02-15

## 2015-02-15 MED ORDER — PROMETHAZINE HCL 25 MG PO TABS
12.5000 mg | ORAL_TABLET | ORAL | Status: DC | PRN
  Administered 2015-02-18: 12.5 mg via ORAL
  Administered 2015-02-18: 25 mg via ORAL
  Filled 2015-02-15 (×2): qty 1

## 2015-02-15 MED ORDER — FENTANYL CITRATE (PF) 100 MCG/2ML IJ SOLN
INTRAMUSCULAR | Status: DC | PRN
  Administered 2015-02-15 (×5): 50 ug via INTRAVENOUS
  Administered 2015-02-15: 100 ug via INTRAVENOUS
  Administered 2015-02-15 (×3): 50 ug via INTRAVENOUS

## 2015-02-15 MED ORDER — IRBESARTAN 150 MG PO TABS
150.0000 mg | ORAL_TABLET | Freq: Every day | ORAL | Status: DC
  Administered 2015-02-15 – 2015-02-18 (×4): 150 mg via ORAL
  Filled 2015-02-15 (×4): qty 1

## 2015-02-15 MED ORDER — THROMBIN 20000 UNITS EX SOLR
CUTANEOUS | Status: DC | PRN
  Administered 2015-02-15 (×2): 20 mL via TOPICAL

## 2015-02-15 MED ORDER — HYDROMORPHONE HCL 1 MG/ML IJ SOLN
0.2500 mg | INTRAMUSCULAR | Status: DC | PRN
Start: 2015-02-15 — End: 2015-02-15

## 2015-02-15 MED ORDER — ALBUMIN HUMAN 5 % IV SOLN
INTRAVENOUS | Status: DC | PRN
  Administered 2015-02-15: 08:00:00 via INTRAVENOUS

## 2015-02-15 MED ORDER — ALBUTEROL SULFATE HFA 108 (90 BASE) MCG/ACT IN AERS
INHALATION_SPRAY | RESPIRATORY_TRACT | Status: DC | PRN
  Administered 2015-02-15: 2 via RESPIRATORY_TRACT

## 2015-02-15 MED ORDER — CEFAZOLIN SODIUM-DEXTROSE 2-3 GM-% IV SOLR
2.0000 g | Freq: Three times a day (TID) | INTRAVENOUS | Status: AC
  Administered 2015-02-15 – 2015-02-16 (×2): 2 g via INTRAVENOUS
  Filled 2015-02-15 (×2): qty 50

## 2015-02-15 MED ORDER — VENLAFAXINE HCL ER 75 MG PO CP24
75.0000 mg | ORAL_CAPSULE | Freq: Every day | ORAL | Status: DC
  Administered 2015-02-15 – 2015-02-19 (×5): 75 mg via ORAL
  Filled 2015-02-15 (×6): qty 1

## 2015-02-15 MED ORDER — PROPOFOL 10 MG/ML IV BOLUS
INTRAVENOUS | Status: AC
  Filled 2015-02-15: qty 20

## 2015-02-15 MED ORDER — BACITRACIN ZINC 500 UNIT/GM EX OINT
TOPICAL_OINTMENT | CUTANEOUS | Status: DC | PRN
  Administered 2015-02-15: 1 via TOPICAL

## 2015-02-15 MED ORDER — ONDANSETRON HCL 4 MG/2ML IJ SOLN
INTRAMUSCULAR | Status: DC | PRN
  Administered 2015-02-15: 4 mg via INTRAVENOUS

## 2015-02-15 MED ORDER — NEOSTIGMINE METHYLSULFATE 10 MG/10ML IV SOLN
INTRAVENOUS | Status: DC | PRN
  Administered 2015-02-15: 5 mg via INTRAVENOUS

## 2015-02-15 MED ORDER — SODIUM CHLORIDE 0.9 % IV SOLN
INTRAVENOUS | Status: DC | PRN
  Administered 2015-02-15 (×3): via INTRAVENOUS

## 2015-02-15 MED ORDER — INSULIN GLARGINE 100 UNIT/ML ~~LOC~~ SOLN
20.0000 [IU] | Freq: Every day | SUBCUTANEOUS | Status: DC
  Administered 2015-02-15 – 2015-02-18 (×4): 20 [IU] via SUBCUTANEOUS
  Filled 2015-02-15 (×6): qty 0.2

## 2015-02-15 MED ORDER — ONDANSETRON HCL 4 MG/2ML IJ SOLN
INTRAMUSCULAR | Status: AC
  Filled 2015-02-15: qty 2

## 2015-02-15 MED ORDER — ONDANSETRON HCL 4 MG/2ML IJ SOLN: 4.0000 mg | INTRAMUSCULAR | Status: DC | PRN

## 2015-02-15 MED ORDER — METFORMIN HCL 500 MG PO TABS
1000.0000 mg | ORAL_TABLET | Freq: Two times a day (BID) | ORAL | Status: DC
  Administered 2015-02-15 – 2015-02-19 (×8): 1000 mg via ORAL
  Filled 2015-02-15 (×9): qty 2

## 2015-02-15 MED ORDER — PANTOPRAZOLE SODIUM 40 MG IV SOLR
40.0000 mg | Freq: Every day | INTRAVENOUS | Status: DC
Start: 2015-02-15 — End: 2015-02-16
  Administered 2015-02-15: 40 mg via INTRAVENOUS
  Filled 2015-02-15 (×2): qty 40

## 2015-02-15 MED ORDER — MIDAZOLAM HCL 2 MG/2ML IJ SOLN
INTRAMUSCULAR | Status: AC
  Filled 2015-02-15: qty 4

## 2015-02-15 MED ORDER — DEXAMETHASONE SODIUM PHOSPHATE 10 MG/ML IJ SOLN
INTRAMUSCULAR | Status: DC | PRN
  Administered 2015-02-15: 10 mg via INTRAVENOUS

## 2015-02-15 MED ORDER — HYDROCODONE-ACETAMINOPHEN 5-325 MG PO TABS
1.0000 | ORAL_TABLET | ORAL | Status: DC | PRN
  Administered 2015-02-15 – 2015-02-16 (×5): 1 via ORAL
  Filled 2015-02-15 (×6): qty 1

## 2015-02-15 SURGICAL SUPPLY — 103 items
AM-24-S SIDECUTTING ARACHNOID KNIFE ×3 IMPLANT
BANDAGE GAUZE 4  KLING STR (GAUZE/BANDAGES/DRESSINGS) ×3 IMPLANT
BATTERY IQ STERILE (MISCELLANEOUS) ×3 IMPLANT
BD VACUTAINER ×3 IMPLANT
BENZOIN TINCTURE PRP APPL 2/3 (GAUZE/BANDAGES/DRESSINGS) IMPLANT
BIT DRILL WIRE PASS 1.3MM (BIT) IMPLANT
BLADE SAW GIGLI 16 STRL (MISCELLANEOUS) IMPLANT
BLADE SURG 15 STRL LF DISP TIS (BLADE) ×1 IMPLANT
BLADE SURG 15 STRL SS (BLADE) ×2
BLADE ULTRA TIP 2M (BLADE) ×3 IMPLANT
BNDG GAUZE ELAST 4 BULKY (GAUZE/BANDAGES/DRESSINGS) ×6 IMPLANT
BRUSH SCRUB EZ 1% IODOPHOR (MISCELLANEOUS) IMPLANT
BRUSH SCRUB EZ PLAIN DRY (MISCELLANEOUS) IMPLANT
BUR ACORN 6.0 PRECISION (BURR) ×2 IMPLANT
BUR ACORN 6.0MM PRECISION (BURR) ×1
BUR ADDG 1.1 (BURR) IMPLANT
BUR ADDG 1.1MM (BURR)
BUR MATCHSTICK NEURO 3.0 LAGG (BURR) IMPLANT
BUR ROUND FLUTED 4 SOFT TCH (BURR) ×2 IMPLANT
BUR ROUND FLUTED 4MM SOFT TCH (BURR) ×1
BUR SPIRAL ROUTER 2.3 (BUR) ×2 IMPLANT
BUR SPIRAL ROUTER 2.3MM (BUR) ×1
CANISTER SUCT 3000ML PPV (MISCELLANEOUS) ×9 IMPLANT
CLIP ANEURY TI PERM 5.0STD7.5M (Clip) ×3 IMPLANT
CLIP ANEURY TI PERM STD ANG 8M (Clip) ×3 IMPLANT
CLIP ANEURY TI TEMP STD STR 9M (Clip) ×3 IMPLANT
CLIP TI MEDIUM 6 (CLIP) IMPLANT
CONT SPEC STER OR (MISCELLANEOUS) IMPLANT
CORDS BIPOLAR (ELECTRODE) IMPLANT
COVER MAYO STAND STRL (DRAPES) IMPLANT
DECANTER SPIKE VIAL GLASS SM (MISCELLANEOUS) IMPLANT
DRAIN SNY WOU 7FLT (WOUND CARE) IMPLANT
DRAPE MICROSCOPE LEICA (MISCELLANEOUS) ×3 IMPLANT
DRAPE NEUROLOGICAL W/INCISE (DRAPES) ×3 IMPLANT
DRAPE WARM FLUID 44X44 (DRAPE) ×3 IMPLANT
DRILL WIRE PASS 1.3MM (BIT)
DRSG ADAPTIC 3X8 NADH LF (GAUZE/BANDAGES/DRESSINGS) ×3 IMPLANT
DRSG TELFA 3X8 NADH (GAUZE/BANDAGES/DRESSINGS) ×3 IMPLANT
DURAMATRIX ONLAY 3X3 (Plate) ×3 IMPLANT
DURAPREP 6ML APPLICATOR 50/CS (WOUND CARE) ×3 IMPLANT
ELECT CAUTERY BLADE 6.4 (BLADE) IMPLANT
ELECT REM PT RETURN 9FT ADLT (ELECTROSURGICAL) ×3
ELECTRODE REM PT RTRN 9FT ADLT (ELECTROSURGICAL) ×1 IMPLANT
EVACUATOR SILICONE 100CC (DRAIN) IMPLANT
FORCEPS BIPOLAR SPETZLER 8 1.0 (NEUROSURGERY SUPPLIES) ×3 IMPLANT
GAUZE SPONGE 4X4 12PLY STRL (GAUZE/BANDAGES/DRESSINGS) IMPLANT
GAUZE SPONGE 4X4 16PLY XRAY LF (GAUZE/BANDAGES/DRESSINGS) IMPLANT
GLOVE BIOGEL PI IND STRL 7.5 (GLOVE) ×4 IMPLANT
GLOVE BIOGEL PI INDICATOR 7.5 (GLOVE) ×8
GLOVE ECLIPSE 6.5 STRL STRAW (GLOVE) ×3 IMPLANT
GLOVE ECLIPSE 7.0 STRL STRAW (GLOVE) ×3 IMPLANT
GLOVE EXAM NITRILE LRG STRL (GLOVE) IMPLANT
GLOVE EXAM NITRILE MD LF STRL (GLOVE) IMPLANT
GLOVE EXAM NITRILE XL STR (GLOVE) IMPLANT
GLOVE EXAM NITRILE XS STR PU (GLOVE) IMPLANT
GLOVE SURG SS PI 7.0 STRL IVOR (GLOVE) ×6 IMPLANT
GOWN STRL REUS W/ TWL LRG LVL3 (GOWN DISPOSABLE) ×2 IMPLANT
GOWN STRL REUS W/ TWL XL LVL3 (GOWN DISPOSABLE) ×1 IMPLANT
GOWN STRL REUS W/TWL 2XL LVL3 (GOWN DISPOSABLE) IMPLANT
GOWN STRL REUS W/TWL LRG LVL3 (GOWN DISPOSABLE) ×4
GOWN STRL REUS W/TWL XL LVL3 (GOWN DISPOSABLE) ×2
HEMOSTAT SURGICEL 2X14 (HEMOSTASIS) ×3 IMPLANT
HOOK DURA (MISCELLANEOUS) IMPLANT
KIT BASIN OR (CUSTOM PROCEDURE TRAY) ×3 IMPLANT
KIT DRAIN CSF ACCUDRAIN (MISCELLANEOUS) IMPLANT
KIT ROOM TURNOVER OR (KITS) ×3 IMPLANT
KNIFE ARACHNOID DISP AM-24-S (MISCELLANEOUS) ×3 IMPLANT
NEEDLE HYPO 25X1 1.5 SAFETY (NEEDLE) ×3 IMPLANT
NS IRRIG 1000ML POUR BTL (IV SOLUTION) ×9 IMPLANT
PACK CRANIOTOMY (CUSTOM PROCEDURE TRAY) ×3 IMPLANT
PAD ARMBOARD 7.5X6 YLW CONV (MISCELLANEOUS) ×9 IMPLANT
PATTIES SURGICAL .25X.25 (GAUZE/BANDAGES/DRESSINGS) IMPLANT
PATTIES SURGICAL .5 X.5 (GAUZE/BANDAGES/DRESSINGS) ×3 IMPLANT
PATTIES SURGICAL .5 X3 (DISPOSABLE) IMPLANT
PATTIES SURGICAL 1/4 X 3 (GAUZE/BANDAGES/DRESSINGS) IMPLANT
PATTIES SURGICAL 1X1 (DISPOSABLE) IMPLANT
PIN MAYFIELD SKULL DISP (PIN) ×3 IMPLANT
PLATE 1.5  2HOLE LNG NEURO (Plate) ×4 IMPLANT
PLATE 1.5 2HOLE LNG NEURO (Plate) ×2 IMPLANT
PLATE 1.5 4HOLE LONG STRAIGHT (Plate) ×3 IMPLANT
RUBBERBAND STERILE (MISCELLANEOUS) ×6 IMPLANT
SCREW SELF DRILL HT 1.5/4MM (Screw) ×18 IMPLANT
SPONGE NEURO XRAY DETECT 1X3 (DISPOSABLE) IMPLANT
SPONGE SURGIFOAM ABS GEL 100 (HEMOSTASIS) ×6 IMPLANT
SPONGE SURGIFOAM ABS GEL 100C (HEMOSTASIS) IMPLANT
STAPLER VISISTAT 35W (STAPLE) ×3 IMPLANT
STOCKINETTE 6  STRL (DRAPES) ×2
STOCKINETTE 6 STRL (DRAPES) ×1 IMPLANT
SUT ETHILON 3 0 FSL (SUTURE) IMPLANT
SUT NURALON 4 0 TR CR/8 (SUTURE) ×6 IMPLANT
SUT VIC AB 0 CT1 18XCR BRD8 (SUTURE) ×2 IMPLANT
SUT VIC AB 0 CT1 8-18 (SUTURE) ×6
SUT VIC AB 3-0 SH 8-18 (SUTURE) ×9 IMPLANT
SYR 20ML ECCENTRIC (SYRINGE) IMPLANT
SYR CONTROL 10ML LL (SYRINGE) IMPLANT
SYR TB 1ML 25GX5/8 (SYRINGE) ×3 IMPLANT
TOWEL OR 17X24 6PK STRL BLUE (TOWEL DISPOSABLE) ×3 IMPLANT
TOWEL OR 17X26 10 PK STRL BLUE (TOWEL DISPOSABLE) ×3 IMPLANT
TRAY FOLEY W/METER SILVER 14FR (SET/KITS/TRAYS/PACK) ×3 IMPLANT
TUBE CONNECTING 12'X1/4 (SUCTIONS) ×2
TUBE CONNECTING 12X1/4 (SUCTIONS) ×4 IMPLANT
UNDERPAD 30X30 INCONTINENT (UNDERPADS AND DIAPERS) IMPLANT
WATER STERILE IRR 1000ML POUR (IV SOLUTION) ×3 IMPLANT

## 2015-02-15 NOTE — Anesthesia Postprocedure Evaluation (Signed)
  Anesthesia Post-op Note  Patient: [name]  Procedure(s) Performed: Procedure(s) with comments: Right Frontotemporal Craniotomy for aneurysm clipping (Right) - right  Patient Location: PACU  Anesthesia Type:General  Level of Consciousness: awake  Airway and Oxygen Therapy: Patient Spontanous Breathing  Post-op Pain: mild  Post-op Assessment: Post-op Vital signs reviewed              Post-op Vital Signs: Reviewed  Last Vitals:  Filed Vitals:   02/15/15 1245  BP:   Pulse: 72  Temp:   Resp: 14    Complications: No apparent anesthesia complications

## 2015-02-15 NOTE — Transfer of Care (Signed)
Immediate Anesthesia Transfer of Care Note  Patient: [name]  Procedure(s) Performed: Procedure(s) with comments: Right Frontotemporal Craniotomy for aneurysm clipping (Right) - right  Patient Location: PACU  Anesthesia Type:General  Level of Consciousness: sedated and responds to stimulation  Airway & Oxygen Therapy: Patient Spontanous Breathing and Patient connected to face mask oxygen  Post-op Assessment: Report given to RN, Post -op Vital signs reviewed and stable, Patient moving all extremities and Patient moving all extremities X 4  Post vital signs: Reviewed and stable  Last Vitals:  Filed Vitals:   02/15/15 0619  BP: 101/56  Pulse: 70  Temp: 36.4 C  Resp: 16    Complications: No apparent anesthesia complications

## 2015-02-15 NOTE — Op Note (Signed)
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 Right pterional craniotomy for clipping of aneurysm, complex due to the need for temporary occlusion 2. Use of operating microscope for microdissection 3. Use of intraoperative ICG videoangiography   SURGEON: Dr. Lisbeth Renshaw, MD  ASSISTANT: Dr. Coletta Memos, MD  ANESTHESIA: General Endotracheal  EBL: 400cc  SPECIMENS: None  DRAINS: None  COMPLICATIONS: None immediate  CONDITION: Hemodynamically stable to PACU  HISTORY: [name] is a 59 y.o. 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 She has previously undergone coiling of the basilar aneurysm, and now presents for clipping of the RMCA aneurysm. After risks, benefits, and alternative were reviewed, she provided consent.  PROCEDURE IN DETAIL: After informed consent was obtained and witnessed, the patient was brought to the operating room. After induction of general anesthesia, the [place] headholder was applied, and the patient was positioned on the operative table in the supine position. All pressure points were meticulously padded. Skin incision was then marked out and prepped and draped in the usual sterile fashion.  After time-out was conducted, skin incision was made sharply and Bovie electrocautery was used to dissect the subcutaneous tissue and galea. Raney clips were then used to secure hemostasis on the skin edges. The superficial temporal artery was dissected free and retracted with the skin flap. Bovie electrocautery was used to dissect through the pericranium as well as the temporalis fascia and muscle. An interfacial dissection was carried out over the temporalis.  Fishhooks were then used for retraction. Bur holes were then created in the pterion, above the root of the zygoma, and the superior temporal line. These are then connected with the craniotome and a standard pterional craniotomy  flap was elevated. Hemostasis was achieved on the bone edges, and a high-speed drill was used to drill down the lesser wing of the sphenoid.  The dura was then opened in cruciate fashion and good hemostasis was achieved on the dural edges. At this point the microscope was draped and brought into the field and the remainder of the case was done under the microscope using microdissection.  The optic nerve and the optico carotid cistern were then incised and dissection was carried laterally until the internal carotid artery was identified. Our attention was then turned to dissection of the internal carotid artery to the level of the ICA bifurcation. At this point the distal sylvian fissure was split in standard fashion. The middle cerebral artery was then identified and traced back to the ICA bifurcation. We then identified the MCA bifurcation, and located the aneurysm at the level of the bifurcation, which appeared to be relatively wide-based, with a small portion of calcification along the tip of the dome. We also identified one small Murphy's tit adjacent to the origin of the superior division. The aneurysm dome was then dissected free utilizing bipolar electrocautery to separate from the temporal lobe. Attempts were made to place a curved aneurysm clip across the neck of the aneurysm, however the aneurysm dome was relatively rigid, and we therefore applied a temporary aneurysm clip across the middle cerebral artery for further dissection around the neck of the aneurysm and the MCA bifurcation. Once the aneurysm was dissected free, an angled fenestrated titanium aneurysm clip was selected, the neck of the aneurysm was gathered into the clip, and the clip was placed across the neck of the aneurysm. Temporary clip was then removed. Once the hemostasis was secured using a combination of bipolar  electrocautery and passive hemostatics, the anesthesia service was instructed to administer a 12.5 mg of ICG. Under the  infrared camera, we confirmed the patency of the M1, and both M2 vessels. No filling of the aneurysm dome was identified. I then punctured the dome of the aneurysm with a 25-gauge needle without any identified bleeding.  At this point the wound is irrigated with copious amounts of normal saline irrigation. Good hemostasis was confirmed on the brain surface. The dura was then reapproximated using a combination of interrupted 4-0 Nurolon stitches. A piece of duramatrix was then placed over the dura. Bone flap was then replaced with standard titanium plates and screws. Muscle was then closed using interrupted 0 Vicryl stitches, and the galea was closed using interrupted 3-0 Vicryl sutures. The skin was closed using standard surgical skin staples. Sterile dressing was then applied after the [place] head holder was removed. The patient was then transferred to the stretcher and taken to the PACU in stable hemodynamic condition.  At the end of the case all sponge, needle, and instrument counts were correct.

## 2015-02-15 NOTE — Anesthesia Procedure Notes (Signed)
Procedure Name: Intubation Date/Time: 02/15/2015 7:45 AM Performed by: Wray Kearns A Pre-anesthesia Checklist: Patient identified, Emergency Drugs available, Suction available, Patient being monitored and Timeout performed Patient Re-evaluated:Patient Re-evaluated prior to inductionOxygen Delivery Method: Circle system utilized Preoxygenation: Pre-oxygenation with 100% oxygen Intubation Type: IV induction and Cricoid Pressure applied Ventilation: Mask ventilation without difficulty Laryngoscope Size: Mac and 3 Grade View: Grade I Tube type: Oral Tube size: 7.5 mm Number of attempts: 1 Airway Equipment and Method: Stylet Placement Confirmation: ETT inserted through vocal cords under direct vision,  positive ETCO2 and breath sounds checked- equal and bilateral Secured at: 22 cm Tube secured with: Tape Dental Injury: Teeth and Oropharynx as per pre-operative assessment

## 2015-02-15 NOTE — Anesthesia Preprocedure Evaluation (Signed)
Anesthesia Evaluation  Patient identified by MRN, date of birth, ID band Patient awake    Reviewed: Allergy & Precautions, NPO status , Patient's Chart, lab work & pertinent test results  Airway Mallampati: II  TM Distance: >3 FB Neck ROM: Full    Dental   Pulmonary pneumonia -, Current Smoker,  breath sounds clear to auscultation        Cardiovascular hypertension, + Peripheral Vascular Disease Rhythm:Regular Rate:Normal     Neuro/Psych    GI/Hepatic negative GI ROS, Neg liver ROS,   Endo/Other  diabetes  Renal/GU      Musculoskeletal   Abdominal   Peds  Hematology   Anesthesia Other Findings   Reproductive/Obstetrics                             Anesthesia Physical Anesthesia Plan  ASA: III  Anesthesia Plan: General   Post-op Pain Management:    Induction: Intravenous  Airway Management Planned: Oral ETT  Additional Equipment:   Intra-op Plan:   Post-operative Plan: Possible Post-op intubation/ventilation  Informed Consent: I have reviewed the patients History and Physical, chart, labs and discussed the procedure including the risks, benefits and alternatives for the proposed anesthesia with the patient or authorized representative who has indicated his/her understanding and acceptance.   Dental advisory given  Plan Discussed with: CRNA, Anesthesiologist and Surgeon  Anesthesia Plan Comments:         Anesthesia Quick Evaluation

## 2015-02-15 NOTE — H&P (Signed)
CC:  Aneurysm  HPI: [name] is a 59 y.o. female with a history of multiple intracranial aneurysms, discovered after a fall. She has undergone endovascular treatment of the basilar apex aneurysm 5 months ago, and now presents for clipping of the right MCA aneurysm.  PMH: Past Medical History  Diagnosis Date  . Type 2 diabetes mellitus   . Essential hypertension, benign   . Mixed hyperlipidemia   . Diabetes mellitus   . Arthritis     degenerative lumbar spine   . Pneumonia 2014    [facility]- admitted for 4 days  . Depression   . Headache     headache- R side of head  . H/O cardiovascular stress test 2012    wnl    PSH: Past Surgical History  Procedure Laterality Date  . Umbilical hernia repair  2008  . Bilateral tubal ligation    . Radiology with anesthesia N/A 09/07/2014    Procedure: Embolization Stent Supported Coil of Aneurysm;  Surgeon: Lisbeth Renshaw, MD;  Location: [facility];  Service: Radiology;  Laterality: N/A;    SH: Social History  Substance Use Topics  . Smoking status: Current Every Day Smoker -- 1.00 packs/day for 40 years    Types: Cigarettes  . Smokeless tobacco: Never Used  . Alcohol Use: No    MEDS: Prior to Admission medications   Medication Sig Start Date End Date Taking? Authorizing Provider  aspirin EC 81 MG tablet Take 81 mg by mouth daily.   Yes Historical Provider, MD  clopidogrel (PLAVIX) 75 MG tablet Take 75 mg by mouth daily.   Yes Historical Provider, MD  HYDROcodone-acetaminophen (NORCO/VICODIN) 5-325 MG per tablet Take 1 tablet by mouth every 6 (six) hours as needed for moderate pain.   Yes Historical Provider, MD  insulin aspart (NOVOLOG) 100 UNIT/ML FlexPen Inject 8 Units into the skin 4 (four) times daily.   Yes Historical Provider, MD  Insulin Glargine (LANTUS) 100 UNIT/ML Solostar Pen Inject 20 Units into the skin at bedtime.    Yes Historical Provider, MD  metFORMIN (GLUCOPHAGE) 500 MG tablet Take 2 tablets (1,000 mg total) by  mouth 2 (two) times daily with a meal. Patient taking differently: Take 1,000 mg by mouth every morning.  09/09/14  Yes Lisbeth Renshaw, MD  olmesartan-hydrochlorothiazide (BENICAR HCT) 20-12.5 MG per tablet Take 1 tablet by mouth daily.   Yes Historical Provider, MD  rosuvastatin (CRESTOR) 20 MG tablet Take 20 mg by mouth daily.   Yes Historical Provider, MD  venlafaxine XR (EFFEXOR-XR) 75 MG 24 hr capsule Take 75 mg by mouth daily with breakfast.   Yes Historical Provider, MD    ALLERGY: No Known Allergies  ROS: ROS  NEUROLOGIC EXAM: Awake, alert, oriented Memory and concentration grossly intact Speech fluent, appropriate CN grossly intact Motor exam: Upper Extremities Deltoid Bicep Tricep Grip  Right 5/5 5/5 5/5 5/5  Left 5/5 5/5 5/5 5/5   Lower Extremity IP Quad PF DF EHL  Right 5/5 5/5 5/5 5/5 5/5  Left 5/5 5/5 5/5 5/5 5/5   Sensation grossly intact to LT  IMGAING: 5mm x 4mm aneurysm arising at the right middle cerebral artery bifurcation, projecting anteriorly and laterally. At least one branch of the middle cerebral artery appears to arise at the neck of the aneurysm.  IMPRESSION: - 59 y.o. female with untreated unruptrued RMCA aneurysm  PLAN: - Proceed with right pterional craniotomy for clipping of RMCA aneurysm  I have reviewed the risks, benefits, and alternatives to  treatment with the patient in the office. After all questions were answered, informed consent was obtained.

## 2015-02-16 ENCOUNTER — Encounter (HOSPITAL_COMMUNITY): Payer: Self-pay | Admitting: Neurosurgery

## 2015-02-16 LAB — TYPE AND SCREEN
ABO/RH(D): O NEG
Antibody Screen: NEGATIVE
Unit division: 0
Unit division: 0

## 2015-02-16 LAB — GLUCOSE, CAPILLARY
Glucose-Capillary: 237 mg/dL — ABNORMAL HIGH (ref 65–99)
Glucose-Capillary: 242 mg/dL — ABNORMAL HIGH (ref 65–99)
Glucose-Capillary: 171 mg/dL — ABNORMAL HIGH (ref 65–99)
Glucose-Capillary: 278 mg/dL — ABNORMAL HIGH (ref 65–99)

## 2015-02-16 MED ORDER — OXYCODONE-ACETAMINOPHEN 5-325 MG PO TABS
1.0000 | ORAL_TABLET | ORAL | Status: DC | PRN
Start: 2015-02-16 — End: 2015-02-18
  Administered 2015-02-16 – 2015-02-18 (×8): 2 via ORAL
  Filled 2015-02-16 (×8): qty 2

## 2015-02-16 MED ORDER — MORPHINE SULFATE (PF) 2 MG/ML IV SOLN
2.0000 mg | INTRAVENOUS | Status: DC | PRN
  Administered 2015-02-16 – 2015-02-17 (×2): 2 mg via INTRAVENOUS
  Filled 2015-02-16 (×2): qty 1

## 2015-02-16 MED ORDER — ALUM & MAG HYDROXIDE-SIMETH 200-200-20 MG/5ML PO SUSP
30.0000 mL | Freq: Four times a day (QID) | ORAL | Status: DC | PRN
  Administered 2015-02-16: 30 mL via ORAL
  Filled 2015-02-16: qty 30

## 2015-02-16 MED ORDER — LEVETIRACETAM 500 MG PO TABS
500.0000 mg | ORAL_TABLET | Freq: Two times a day (BID) | ORAL | Status: DC
  Administered 2015-02-16 – 2015-02-19 (×6): 500 mg via ORAL
  Filled 2015-02-16 (×6): qty 1

## 2015-02-16 MED ORDER — PANTOPRAZOLE SODIUM 40 MG PO TBEC
40.0000 mg | DELAYED_RELEASE_TABLET | Freq: Every day | ORAL | Status: DC
  Administered 2015-02-16 – 2015-02-18 (×3): 40 mg via ORAL
  Filled 2015-02-16 (×3): qty 1

## 2015-02-16 NOTE — Care Management Note (Signed)
Case Management Note  Patient Details  Name: JOANA NOLTON MRN: [id number] Date of Birth: 05/27/1956  Subjective/Objective:  Pt admitted on 02/15/15 s/p craniotomy for aneurysm clipping.  PTA, pt independent of ADLS.                    Action/Plan: Will follow for discharge needs as pt progresses.    Expected Discharge Date:                  Expected Discharge Plan:  Home/Self Care  In-House Referral:     Discharge planning Services  CM Consult  Post Acute Care Choice:    Choice offered to:     DME Arranged:    DME Agency:     HH Arranged:    HH Agency:     Status of Service:  In process, will continue to follow  Medicare Important Message Given:    Date Medicare IM Given:    Medicare IM give by:    Date Additional Medicare IM Given:    Additional Medicare Important Message give by:     If discussed at Long Length of Stay Meetings, dates discussed:    Additional Comments:  Quintella Baton, RN, BSN  Trauma/Neuro ICU Case Manager [phone]

## 2015-02-16 NOTE — Progress Notes (Signed)
Pt seen and examined. No issues overnight. Pt has HA, otherwise no c/o. Ambulating, tolerating diet, voiding.  EXAM: Temp:  [97 F (36.1 C)-98.6 F (37 C)] 98.2 F (36.8 C) (08/26 1128) Pulse Rate:  [54-81] 72 (08/26 1200) Resp:  [11-21] 15 (08/26 1200) BP: (96-119)/(45-66) 111/64 mmHg (08/26 1200) SpO2:  [94 %-100 %] 96 % (08/26 1200) Arterial Line BP: (81-152)/(50-73) 110/58 mmHg (08/26 1000) Intake/Output      08/25 0701 - 08/26 0700 08/26 0701 - 08/27 0700   P.O. 120 360   I.V. 4300 375   IV Piggyback 455 100   Total Intake 4875 835   Urine 1025 575   Blood 300    Total Output 1325 575   Net +3550 +260        Urine Occurrence 2 x     Awake, alert, oriented Speech fluent CN intact Good strength throughout, no drift Wound c/d/i  LABS: Lab Results  Component Value Date   CREATININE 0.79 02/07/2015   BUN 14 02/07/2015   NA 140 02/15/2015   K 4.4 02/15/2015   CL 102 02/07/2015   CO2 21* 02/07/2015   Lab Results  Component Value Date   WBC 6.5 02/07/2015   HGB 10.5* 02/15/2015   HCT 31.0* 02/15/2015   MCV 86.9 02/07/2015   PLT 163 02/07/2015    IMPRESSION: - 59 y.o. female POD#1 s/p right pterional crani for RMCA clipping, doing well  PLAN: - Transfer to floor - Cont to mobilize - Possibly home this weekend.

## 2015-02-17 LAB — GLUCOSE, CAPILLARY
Glucose-Capillary: 197 mg/dL — ABNORMAL HIGH (ref 65–99)
Glucose-Capillary: 177 mg/dL — ABNORMAL HIGH (ref 65–99)
Glucose-Capillary: 172 mg/dL — ABNORMAL HIGH (ref 65–99)
Glucose-Capillary: 115 mg/dL — ABNORMAL HIGH (ref 65–99)

## 2015-02-17 MED ORDER — INFLUENZA VAC SPLIT QUAD 0.5 ML IM SUSY
0.5000 mL | PREFILLED_SYRINGE | INTRAMUSCULAR | Status: DC
  Filled 2015-02-17: qty 0.5

## 2015-02-17 NOTE — Progress Notes (Signed)
Patient smokes over a pack of cigarettes a day and is asking to go outside to smoke, is it possible to write for her a nicotine patch. Thanks

## 2015-02-17 NOTE — Progress Notes (Signed)
Patient ID: [name], female   DOB: 10-01-1955, 59 y.o.   MRN: [id number] Subjective:  The patient is alert and pleasant. She is in no apparent distress. She complains of headaches.  Objective: Vital signs in last 24 hours: Temp:  [98.2 F (36.8 C)-98.7 F (37.1 C)] 98.5 F (36.9 C) (08/27 4098) Pulse Rate:  [58-81] 58 (08/27 0614) Resp:  [11-20] 18 (08/27 0614) BP: (98-142)/(49-128) 113/63 mmHg (08/27 0614) SpO2:  [93 %-97 %] 95 % (08/27 1191) Arterial Line BP: (110)/(58) 110/58 mmHg (08/26 1000)  Intake/Output from previous day: 08/26 0701 - 08/27 0700 In: 1985 [P.O.:1320; I.V.:565; IV Piggyback:100] Out: 675 [Urine:675] Intake/Output this shift:    Physical exam the patient is alert and oriented 3. She is moving all 4 extremities well.  Lab Results:  Recent Labs  02/15/15 1032 02/15/15 1044  HGB 11.6* 10.5*  HCT 34.0* 31.0*   BMET  Recent Labs  02/15/15 0908  02/15/15 1032 02/15/15 1044  NA 138  < > 138 140  K 4.1  < > 4.5 4.4  GLUCOSE 230*  --  289*  --   < > = values in this interval not displayed.  Studies/Results: No results found.  Assessment/Plan: Postop day #2: The patient is doing well. We will likely send her home tomorrow.  LOS: 2 days     Axavier Pressley D 02/17/2015, 9:29 AM

## 2015-02-18 LAB — BASIC METABOLIC PANEL
Anion gap: 7 (ref 5–15)
BUN: 15 mg/dL (ref 6–20)
CO2: 25 mmol/L (ref 22–32)
Calcium: 8.4 mg/dL — ABNORMAL LOW (ref 8.9–10.3)
Chloride: 102 mmol/L (ref 101–111)
Creatinine, Ser: 0.86 mg/dL (ref 0.44–1.00)
GFR calc Af Amer: >60 mL/min (ref >60)
GFR calc non Af Amer: >60 mL/min (ref >60)
Glucose, Bld: 166 mg/dL — ABNORMAL HIGH (ref 65–99)
Potassium: 4 mmol/L (ref 3.5–5.1)
Sodium: 134 mmol/L — ABNORMAL LOW (ref 135–145)

## 2015-02-18 LAB — GLUCOSE, CAPILLARY
Glucose-Capillary: 183 mg/dL — ABNORMAL HIGH (ref 65–99)
Glucose-Capillary: 151 mg/dL — ABNORMAL HIGH (ref 65–99)
Glucose-Capillary: 169 mg/dL — ABNORMAL HIGH (ref 65–99)
Glucose-Capillary: 126 mg/dL — ABNORMAL HIGH (ref 65–99)

## 2015-02-18 MED ORDER — NICOTINE 21 MG/24HR TD PT24
21.0000 mg | MEDICATED_PATCH | Freq: Every day | TRANSDERMAL | Status: DC
  Administered 2015-02-18 – 2015-02-19 (×2): 21 mg via TRANSDERMAL
  Filled 2015-02-18 (×2): qty 1

## 2015-02-18 MED ORDER — OXYCODONE-ACETAMINOPHEN 5-325 MG PO TABS
1.0000 | ORAL_TABLET | ORAL | Status: DC | PRN
  Administered 2015-02-18 – 2015-02-19 (×6): 1 via ORAL
  Filled 2015-02-18 (×6): qty 1

## 2015-02-18 NOTE — Progress Notes (Signed)
Patient ID: [name], female   DOB: Mar 13, 1956, 59 y.o.   MRN: [id number] Subjective:  The patient is somnolent but arousable. She continues to fall asleep when I talk to her.  Objective: Vital signs in last 24 hours: Temp:  [97.7 F (36.5 C)-98.8 F (37.1 C)] 98.8 F (37.1 C) (08/28 0610) Pulse Rate:  [53-67] 63 (08/28 0610) Resp:  [16-20] 18 (08/28 0610) BP: (97-118)/(51-64) 109/60 mmHg (08/28 0610) SpO2:  [90 %-97 %] 96 % (08/28 0610) Weight:  [97.3 kg (214 lb 8.1 oz)] 97.3 kg (214 lb 8.1 oz) (08/27 1709)  Intake/Output from previous day:   Intake/Output this shift:    Physical exam the patient is somnolent but arousable. She is moving all 4 extremities. Her pupils are equal. Her speech is normal.  Lab Results:  Recent Labs  02/15/15 1032 02/15/15 1044  HGB 11.6* 10.5*  HCT 34.0* 31.0*   BMET  Recent Labs  02/15/15 0908  02/15/15 1032 02/15/15 1044  NA 138  < > 138 140  K 4.1  < > 4.5 4.4  GLUCOSE 230*  --  289*  --   < > = values in this interval not displayed.  Studies/Results: No results found.  Assessment/Plan: Postop day #3: Patient is very somnolent but easily arousable. I think she is taking too much Percocet. I will decrease it. I'll also check her sodium to make sure that she is not hyponatremic with her recent craniotomy and taking hydrochlorothiazide.  LOS: 3 days     Cendy Oconnor D 02/18/2015, 8:58 AM

## 2015-02-19 LAB — GLUCOSE, CAPILLARY
Glucose-Capillary: 145 mg/dL — ABNORMAL HIGH (ref 65–99)
Glucose-Capillary: 141 mg/dL — ABNORMAL HIGH (ref 65–99)

## 2015-02-19 MED ORDER — LEVETIRACETAM 500 MG PO TABS
500.0000 mg | ORAL_TABLET | Freq: Two times a day (BID) | ORAL | Status: DC
Start: 2015-02-19 — End: 2016-02-07

## 2015-02-19 MED ORDER — OXYCODONE-ACETAMINOPHEN 5-325 MG PO TABS: 1.0000 | ORAL_TABLET | ORAL | Status: DC | PRN

## 2015-02-19 NOTE — Discharge Summary (Signed)
  Physician Discharge Summary  Patient ID: BURGUNDY MATUSZAK MRN: [id number] DOB/AGE: 1955/12/27 59 y.o.  Admit date: 02/15/2015 Discharge date: 02/19/2015  Admission Diagnoses: Right MCA aneurysm  Discharge Diagnoses: Same Active Problems:   Cerebral aneurysm, nonruptured   Discharged Condition: Stable  Hospital Course:  Mrs. [name] is a 59 y.o. female admitted after elective, uncomplicated right craniotomy for clipping of MCA aneurysm. She was at her baseline postop, observed in the ICU, and then on the floor. She was ambulating well, tolerating diet, voiding normally. Her only c/o was HA.   Treatments: Surgery - right craniotomy for clipping of aneurysm  Discharge Exam: Blood pressure 119/53, pulse 68, temperature 98.4 F (36.9 C), temperature source Oral, resp. rate 16, height  (1.6 m), weight 97.3 kg (214 lb 8.1 oz), SpO2 99 %. Awake, alert, oriented Speech fluent, appropriate CN grossly intact 5/5 BUE/BLE Wound c/d/i  Disposition: 01-Home or Self Care     Medication List    STOP taking these medications        clopidogrel 75 MG tablet  Commonly known as:  PLAVIX      TAKE these medications        aspirin EC 81 MG tablet  Take 81 mg by mouth daily.     HYDROcodone-acetaminophen 5-325 MG per tablet  Commonly known as:  NORCO/VICODIN  Take 1 tablet by mouth every 6 (six) hours as needed for moderate pain.     insulin aspart 100 UNIT/ML FlexPen  Commonly known as:  NOVOLOG  Inject 8 Units into the skin 4 (four) times daily.     Insulin Glargine 100 UNIT/ML Solostar Pen  Commonly known as:  LANTUS  Inject 20 Units into the skin at bedtime.     levETIRAcetam 500 MG tablet  Commonly known as:  KEPPRA  Take 1 tablet (500 mg total) by mouth 2 (two) times daily.     metFORMIN 500 MG tablet  Commonly known as:  GLUCOPHAGE  Take 2 tablets (1,000 mg total) by mouth 2 (two) times daily with a meal.     olmesartan-hydrochlorothiazide 20-12.5 MG per  tablet  Commonly known as:  BENICAR HCT  Take 1 tablet by mouth daily.     oxyCODONE-acetaminophen 5-325 MG per tablet  Commonly known as:  PERCOCET/ROXICET  Take 1 tablet by mouth every 4 (four) hours as needed for moderate pain.     rosuvastatin 20 MG tablet  Commonly known as:  CRESTOR  Take 20 mg by mouth daily.     venlafaxine XR 75 MG 24 hr capsule  Commonly known as:  EFFEXOR-XR  Take 75 mg by mouth daily with breakfast.           Follow-up Information    Follow up with Alisabeth Selkirk, C, MD In 2 weeks.   Specialty:  Neurosurgery   Contact information:   [address] [place] [address] [phone]       Signed: Lisbeth Renshaw, Salena Saner 02/19/2015, 9:10 AM

## 2015-02-19 NOTE — Progress Notes (Signed)
Discharge orders received, Pt for discharge home today. IV d/c'd. D/c instructions and RX given with verbalized understanding. Family at bedside to assist patient with discharge. Staff bought pt downstairs via wheelchair. 02/19/15 1550

## 2015-02-19 NOTE — Progress Notes (Signed)
Spoke to Son, family will able to provide transport until after 1400. CN aware. Patient aware.  Sim Boast, RN

## 2015-02-22 ENCOUNTER — Emergency Department (HOSPITAL_COMMUNITY): Payer: Medicaid Other

## 2015-02-22 ENCOUNTER — Emergency Department (HOSPITAL_COMMUNITY)
Admission: EM | Admit: 2015-02-22 | Discharge: 2015-02-22 | Disposition: A | Discharge status: Home / Self Care | Payer: Medicaid Other | Attending: Emergency Medicine | Admitting: Emergency Medicine

## 2015-02-22 ENCOUNTER — Encounter (HOSPITAL_COMMUNITY): Payer: Self-pay | Admitting: Emergency Medicine

## 2015-02-22 DIAGNOSIS — Z720 Tobacco use: Secondary | ICD-10-CM | POA: Diagnosis not present

## 2015-02-22 DIAGNOSIS — Z79899 Other long term (current) drug therapy: Secondary | ICD-10-CM | POA: Insufficient documentation

## 2015-02-22 DIAGNOSIS — Z8659 Personal history of other mental and behavioral disorders: Secondary | ICD-10-CM | POA: Diagnosis not present

## 2015-02-22 DIAGNOSIS — R51 Headache: Secondary | ICD-10-CM | POA: Diagnosis not present

## 2015-02-22 DIAGNOSIS — R05 Cough: Secondary | ICD-10-CM | POA: Diagnosis not present

## 2015-02-22 DIAGNOSIS — Z794 Long term (current) use of insulin: Secondary | ICD-10-CM | POA: Insufficient documentation

## 2015-02-22 DIAGNOSIS — I10 Essential (primary) hypertension: Secondary | ICD-10-CM | POA: Diagnosis not present

## 2015-02-22 DIAGNOSIS — E119 Type 2 diabetes mellitus without complications: Secondary | ICD-10-CM | POA: Insufficient documentation

## 2015-02-22 DIAGNOSIS — G8918 Other acute postprocedural pain: Secondary | ICD-10-CM | POA: Diagnosis not present

## 2015-02-22 DIAGNOSIS — Z8701 Personal history of pneumonia (recurrent): Secondary | ICD-10-CM | POA: Insufficient documentation

## 2015-02-22 DIAGNOSIS — R519 Headache, unspecified: Secondary | ICD-10-CM

## 2015-02-22 DIAGNOSIS — Z7982 Long term (current) use of aspirin: Secondary | ICD-10-CM | POA: Insufficient documentation

## 2015-02-22 DIAGNOSIS — M1990 Unspecified osteoarthritis, unspecified site: Secondary | ICD-10-CM | POA: Diagnosis not present

## 2015-02-22 DIAGNOSIS — E785 Hyperlipidemia, unspecified: Secondary | ICD-10-CM | POA: Insufficient documentation

## 2015-02-22 DIAGNOSIS — R059 Cough, unspecified: Secondary | ICD-10-CM

## 2015-02-22 MED ORDER — SODIUM CHLORIDE 0.9 % IV SOLN
INTRAVENOUS | Status: DC
  Administered 2015-02-22: 07:00:00 via INTRAVENOUS

## 2015-02-22 MED ORDER — SODIUM CHLORIDE 0.9 % IV SOLN: INTRAVENOUS | Status: DC

## 2015-02-22 MED ORDER — METOCLOPRAMIDE HCL 5 MG/ML IJ SOLN
10.0000 mg | Freq: Once | INTRAMUSCULAR | Status: AC
  Administered 2015-02-22: 10 mg via INTRAVENOUS
  Filled 2015-02-22: qty 2

## 2015-02-22 MED ORDER — DIPHENHYDRAMINE HCL 50 MG/ML IJ SOLN
25.0000 mg | Freq: Once | INTRAMUSCULAR | Status: AC
  Administered 2015-02-22: 25 mg via INTRAVENOUS
  Filled 2015-02-22: qty 1

## 2015-02-22 NOTE — Discharge Instructions (Signed)
General Headache Without Cause °A headache is pain or discomfort felt around the head or neck area. The specific cause of a headache may not be found. There are many causes and types of headaches. A few common ones are: °· Tension headaches. °· Migraine headaches. °· Cluster headaches. °· Chronic daily headaches. °HOME CARE INSTRUCTIONS  °· Keep all follow-up appointments with your caregiver or any specialist referral. °· Only take over-the-counter or prescription medicines for pain or discomfort as directed by your caregiver. °· Lie down in a dark, quiet room when you have a headache. °· Keep a headache journal to find out what may trigger your migraine headaches. For example, write down: °· What you eat and drink. °· How much sleep you get. °· Any change to your diet or medicines. °· Try massage or other relaxation techniques. °· Put ice packs or heat on the head and neck. Use these 3 to 4 times per day for 15 to 20 minutes each time, or as needed. °· Limit stress. °· Sit up straight, and do not tense your muscles. °· Quit smoking if you smoke. °· Limit alcohol use. °· Decrease the amount of caffeine you drink, or stop drinking caffeine. °· Eat and sleep on a regular schedule. °· Get 7 to 9 hours of sleep, or as recommended by your caregiver. °· Keep lights dim if bright lights bother you and make your headaches worse. °SEEK MEDICAL CARE IF:  °· You have problems with the medicines you were prescribed. °· Your medicines are not working. °· You have a change from the usual headache. °· You have nausea or vomiting. °SEEK IMMEDIATE MEDICAL CARE IF:  °· Your headache becomes severe. °· You have a fever. °· You have a stiff neck. °· You have loss of vision. °· You have muscular weakness or loss of muscle control. °· You start losing your balance or have trouble walking. °· You feel faint or pass out. °· You have severe symptoms that are different from your first symptoms. °MAKE SURE YOU:  °· Understand these  instructions. °· Will watch your condition. °· Will get help right away if you are not doing well or get worse. °Document Released: 06/09/2005 Document Revised: 09/01/2011 Document Reviewed: 06/25/2011 °ExitCare® Patient Information ©2015 [employer]. This information is not intended to replace advice given to you by your health care provider. Make sure you discuss any questions you have with your health care provider. °Cough, Adult ° A cough is a reflex that helps clear your throat and airways. It can help heal the body or may be a reaction to an irritated airway. A cough may only last 2 or 3 weeks (acute) or may last more than 8 weeks (chronic).  °CAUSES °Acute cough: °· Viral or bacterial infections. °Chronic cough: °· Infections. °· Allergies. °· Asthma. °· Post-nasal drip. °· Smoking. °· Heartburn or acid reflux. °· Some medicines. °· Chronic lung problems (COPD). °· Cancer. °SYMPTOMS  °· Cough. °· Fever. °· Chest pain. °· Increased breathing rate. °· High-pitched whistling sound when breathing (wheezing). °· Colored mucus that you cough up (sputum). °TREATMENT  °· A bacterial cough may be treated with antibiotic medicine. °· A viral cough must run its course and will not respond to antibiotics. °· Your caregiver may recommend other treatments if you have a chronic cough. °HOME CARE INSTRUCTIONS  °· Only take over-the-counter or prescription medicines for pain, discomfort, or fever as directed by your caregiver. Use cough suppressants only as directed by your   caregiver. °· Use a cold steam vaporizer or humidifier in your bedroom or home to help loosen secretions. °· Sleep in a semi-upright position if your cough is worse at night. °· Rest as needed. °· Stop smoking if you smoke. °SEEK IMMEDIATE MEDICAL CARE IF:  °· You have pus in your sputum. °· Your cough starts to worsen. °· You cannot control your cough with suppressants and are losing sleep. °· You begin coughing up blood. °· You have difficulty  breathing. °· You develop pain which is getting worse or is uncontrolled with medicine. °· You have a fever. °MAKE SURE YOU:  °· Understand these instructions. °· Will watch your condition. °· Will get help right away if you are not doing well or get worse. °Document Released: 12/06/2010 Document Revised: 09/01/2011 Document Reviewed: 12/06/2010 °ExitCare® Patient Information ©2015 [employer]. This information is not intended to replace advice given to you by your health care provider. Make sure you discuss any questions you have with your health care provider. ° °

## 2015-02-22 NOTE — ED Notes (Signed)
Patient ambulatory to restroom with steady gait.

## 2015-02-22 NOTE — ED Provider Notes (Signed)
CSN: [id number]     Arrival date & time 02/22/15  0346 History   First MD Initiated Contact with Patient 02/22/15 [phone]     Chief Complaint  Patient presents with  . 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 Her surgeon was Dr. Conchita Paris. She reports she had a headache while in the hospital that has not gone away. She describes the headache as pounding. She denies any light or noise sensitivity. However she states bending over makes the headache worse as well as coughing. She states she's had a cough for a few weeks and states she is coughing up phlegm but is unable to describe it. She has not had fever or any shortness of breath. She has not called her surgeon to let him know she is still having pain.   PCP Dr Renard Matter Neurosurgery Dr Conchita Paris  Past Medical History  Diagnosis Date  . Type 2 diabetes mellitus   . Essential hypertension, benign   . Mixed hyperlipidemia   . Diabetes mellitus   . Arthritis     degenerative lumbar spine   . Pneumonia 2014    [facility]- admitted for 4 days  . Depression   . Headache     headache- R side of head  . H/O cardiovascular stress test 2012    [facility]   Past Surgical History  Procedure Laterality Date  . Umbilical hernia repair  2008  . Bilateral tubal ligation    . Radiology with anesthesia N/A 09/07/2014    Procedure: Embolization Stent Supported Coil of Aneurysm;  Surgeon: Lisbeth Renshaw, MD;  [facility];  Service: Radiology;  Laterality: N/A;  . Craniotomy Right 02/15/2015    Procedure: Right Frontotemporal Craniotomy for aneurysm clipping;  Surgeon: Lisbeth Renshaw, MD;  Location: [facility];  Service: Neurosurgery;  Laterality: Right;  right   Family History  Problem Relation Age of Onset  . Cancer Father   . COPD Mother    Social History  Substance Use Topics   . Smoking status: Current Every Day Smoker -- 1.00 packs/day for 40 years    Types: Cigarettes  . Smokeless tobacco: Never Used  . Alcohol Use: No   Lives at home Lives alone, states her son checks on her regularly  OB History    Gravida Para Term Preterm AB TAB SAB Ectopic Multiple Living   2 2 2             Review of Systems  All other systems reviewed and are negative.     Allergies  Review of patient's allergies indicates no known allergies.  Home Medications   Prior to Admission medications   Medication Sig Start Date End Date Taking? Authorizing Provider  aspirin EC 81 MG tablet Take 81 mg by mouth daily.    Historical Provider, MD  HYDROcodone-acetaminophen (NORCO/VICODIN) 5-325 MG per tablet Take 1 tablet by mouth every 6 (six) hours as needed for moderate pain.    Historical Provider, MD  insulin aspart (NOVOLOG) 100 UNIT/ML FlexPen Inject 8 Units into the skin 4 (four) times daily.    Historical Provider, MD  Insulin Glargine (LANTUS) 100 UNIT/ML Solostar Pen Inject 20 Units into the skin at bedtime.     Historical Provider, MD  levETIRAcetam (KEPPRA) 500 MG tablet Take 1 tablet (500 mg total) by mouth 2 (two) times daily. 02/19/15   Lisbeth Renshaw, MD  metFORMIN (  GLUCOPHAGE) 500 MG tablet Take 2 tablets (1,000 mg total) by mouth 2 (two) times daily with a meal. Patient taking differently: Take 1,000 mg by mouth every morning.  09/09/14   Lisbeth Renshaw, MD  olmesartan-hydrochlorothiazide (BENICAR HCT) 20-12.5 MG per tablet Take 1 tablet by mouth daily.    Historical Provider, MD  oxyCODONE-acetaminophen (PERCOCET/ROXICET) 5-325 MG per tablet Take 1 tablet by mouth every 4 (four) hours as needed for moderate pain. 02/19/15   Lisbeth Renshaw, MD  rosuvastatin (CRESTOR) 20 MG tablet Take 20 mg by mouth daily.    Historical Provider, MD  venlafaxine XR (EFFEXOR-XR) 75 MG 24 hr capsule Take 75 mg by mouth daily with breakfast.    Historical Provider, MD   BP 123/78  mmHg  Pulse 68  Temp(Src) 97.5 F (36.4 C)  Resp 18  Wt 214 lb (97.07 kg)  SpO2 97%  Vital signs normal   Physical Exam  Constitutional: She is oriented to person, place, and time. She appears well-developed and well-nourished.  Non-toxic appearance. She does not appear ill. No distress.  HENT:  Head: Normocephalic.  Right Ear: External ear normal.  Left Ear: External ear normal.  Nose: Nose normal. No mucosal edema or rhinorrhea.  Mouth/Throat: Oropharynx is clear and moist and mucous membranes are normal. No dental abscesses or uvula swelling.  Patient has a large right-sided anterior surgical incision with staples still in place consistent with her history of recent craniotomy. She has faint yellow bruising underneath her right eye.  Eyes: Conjunctivae and EOM are normal. Pupils are equal, round, and reactive to light.  Neck: Normal range of motion and full passive range of motion without pain. Neck supple.  Cardiovascular: Normal rate, regular rhythm and normal heart sounds.  Exam reveals no gallop and no friction rub.   No murmur heard. Pulmonary/Chest: Effort normal and breath sounds normal. No respiratory distress. She has no wheezes. She has no rhonchi. She has no rales. She exhibits no tenderness and no crepitus.  I did not experience any coughing from the patient during my interview or being around her room.  Abdominal: Soft. Normal appearance and bowel sounds are normal. She exhibits no distension. There is no tenderness. There is no rebound and no guarding.  Musculoskeletal: Normal range of motion. She exhibits no edema or tenderness.  Moves all extremities well.   Neurological: She is alert and oriented to person, place, and time. She has normal strength. No cranial nerve deficit.  Skin: Skin is warm, dry and intact. No rash noted. No erythema. No pallor.  Psychiatric: She has a normal mood and affect. Her speech is normal and behavior is normal. Her mood appears not  anxious.  Nursing note and vitals reviewed.   ED Course  Procedures (including critical care time)  Medications  0.9 %  sodium chloride infusion ( Intravenous New Bag/Given 02/22/15 0654)  0.9 %  sodium chloride infusion (not administered)  metoCLOPramide (REGLAN) injection 10 mg (10 mg Intravenous Given 02/22/15 0654)  diphenhydrAMINE (BENADRYL) injection 25 mg (25 mg Intravenous Given 02/22/15 0655)    Because the patient describes her headache as a pounding headache she was given a migraine cocktail.  Review of the [place] shows patient gets hydrocodone on a monthly basis. Patient received #60 hydrocodone 5/325 on August 23, August 4, July 21, June 16, and April 19. She received #80 oxycodone 5/325 on March 19. These are all prescribed by her neurosurgery's group  08:00 pt turned over to Dr Rubin Payor  to manage her headache.   Labs Review Labs Reviewed - No data to display  Imaging Review Dg Chest 2 View  02/22/2015   CLINICAL DATA:  Headache and cough.  EXAM: CHEST  2 VIEW  COMPARISON:  02/14/2015  FINDINGS: The cardiac silhouette, mediastinal and hilar contours are within normal limits and stable. Low lung volumes with vascular crowding and streaky atelectasis. Mild vascular congestion but no overt pulmonary edema or pleural effusion. The bony thorax is intact.  IMPRESSION: Low lung volumes with vascular crowding and streaky atelectasis.   Electronically Signed   By: Rudie Meyer M.D.   On: 02/22/2015 07:51   Ct Head Wo Contrast  02/22/2015   CLINICAL DATA:  Frontal headache and status post recent craniotomy for clipping of right middle cerebral artery aneurysm. Status post previous embolization of basilar artery aneurysm.  EXAM: CT HEAD WITHOUT CONTRAST  TECHNIQUE: Contiguous axial images were obtained from the base of the skull through the vertex without intravenous contrast.  COMPARISON:  09/27/2014  FINDINGS: Coil mass noted at the basilar tip. Aneurysm clips present in  the distribution of the peripheral right middle cerebral artery. Changes related to right frontotemporal craniotomy. Underneath the craniotomy site, there is a small amount of extra-axial fluid with some high density adjacent to the surface of the brain. Some of this may represent a small amount of residual blood related to the surgery or extra-axial density after craniotomy. No significant fluid is identified and there is no evidence of mass effect or hydrocephalus. No infarction identified.  The scalp superficial to the craniotomy site is swollen and contains a small amount of subcutaneous air. No focal fluid collection is identified in the soft tissues and findings may be related to postoperative edema or potentially component of scalp cellulitis. Correlation suggested with exam. There is some opacification of right lateral frontal air cells.  IMPRESSION: Postoperative changes related to recent craniotomy and clipping of right MCA aneurysm. A small amount of extra-axial and some adjacent high density seen underneath the craniotomy site. This may represent a small amount of residual blood related to the surgery. This is not a significant amount of fluid and no mass-effect is identified. The scalp overlying the craniotomy site shows swelling and a small amount of subcutaneous air. Correlation suggested with any clinical evidence of cellulitis. This may represent normal expected postoperative swelling.   Electronically Signed   By: Irish Lack M.D.   On: 02/22/2015 07:48   I have personally reviewed and evaluated these images and lab results as part of my medical decision-making.   EKG Interpretation None      MDM   Final diagnoses:  Headache, unspecified headache type  Post-op pain  Coughing    Disposition pending  Devoria Albe, MD, Concha Pyo, MD 02/22/15 [phone]

## 2015-02-22 NOTE — ED Provider Notes (Signed)
  Physical Exam  BP 123/78 mmHg  Pulse 68  Temp(Src) 97.5 F (36.4 C)  Resp 18  Wt 214 lb (97.07 kg)  SpO2 97%  Physical Exam  ED Course  Procedures  MDM Head CT and chest x-ray reassuring. Patient feels somewhat better after treatment. Will discharge home.      Benjiman Core, MD 02/22/15 [phone]

## 2015-02-22 NOTE — ED Notes (Signed)
Pt c/o increased pain to head. Pt states she cannot rest and needs something stronger for pain.

## 2015-03-29 ENCOUNTER — Other Ambulatory Visit: Payer: Self-pay | Admitting: *Deleted

## 2015-03-29 ENCOUNTER — Ambulatory Visit (HOSPITAL_COMMUNITY): Payer: Medicaid Other | Attending: Orthopedic Surgery

## 2015-03-29 ENCOUNTER — Ambulatory Visit (INDEPENDENT_AMBULATORY_CARE_PROVIDER_SITE_OTHER): Payer: Medicaid Other | Admitting: Orthopedic Surgery

## 2015-03-29 ENCOUNTER — Encounter: Payer: Self-pay | Admitting: Orthopedic Surgery

## 2015-03-29 VITALS — BP 136/86 | Ht 63.0 in | Wt 214.0 lb

## 2015-03-29 DIAGNOSIS — M6289 Other specified disorders of muscle: Secondary | ICD-10-CM | POA: Diagnosis present

## 2015-03-29 DIAGNOSIS — M79642 Pain in left hand: Secondary | ICD-10-CM | POA: Insufficient documentation

## 2015-03-29 DIAGNOSIS — R29898 Other symptoms and signs involving the musculoskeletal system: Secondary | ICD-10-CM

## 2015-03-29 DIAGNOSIS — M25642 Stiffness of left hand, not elsewhere classified: Secondary | ICD-10-CM

## 2015-03-29 NOTE — Patient Instructions (Signed)
Call [facility] therapy dept to schedule therapy visits 

## 2015-03-29 NOTE — Progress Notes (Signed)
Patient ID: [name], female   DOB: Sep 28, 1955, 59 y.o.   MRN: [id number]  Chief Complaint  Patient presents with  . Hand Pain    left hand pain and swelling, old fracture, untreated, s/p fall 06/09/14, consult requested by A. MCINNIS    HPI OURANIA [name] is a 59 y.o. female.  She presents with posttraumatic pain swelling and stiffness of her left hand after falling down her ex-husband's steps on 06/09/2014. She says she was seen at the local hospital emergency room but didn't have insurance presents now 10 months post injury with inability to functionally use her left upper extremity complaining of sharp throbbing constant stabbing aching pain and stiffness of the left hand and wrist. She rates her pain 8 out of 10 she is currently on hydrocodone 5 mg she has had no treatment other than what she got in the emergency room. Please see those records for that  She listed her review of systems is back pain joint pain depression vision disturbance night sweats lightheadedness and ankle leg swelling  Review of Systems Review of Systems  Past Medical History  Diagnosis Date  . Type 2 diabetes mellitus   . Essential hypertension, benign   . Mixed hyperlipidemia   . Diabetes mellitus   . Arthritis     degenerative lumbar spine   . Pneumonia 2014    [facility]- admitted for 4 days  . Depression   . Headache     headache- R side of head  . H/O cardiovascular stress test 2012    [facility]    Past Surgical History  Procedure Laterality Date  . Umbilical hernia repair  2008  . Bilateral tubal ligation    . Radiology with anesthesia N/A 09/07/2014    Procedure: Embolization Stent Supported Coil of Aneurysm;  Surgeon: Lisbeth Renshaw, MD;  Location: [facility] OR;  Service: Radiology;  Laterality: N/A;  . Craniotomy Right 02/15/2015    Procedure: Right Frontotemporal Craniotomy for aneurysm clipping;  Surgeon: Lisbeth Renshaw, MD;  Location: [facility];  Service: Neurosurgery;  Laterality: Right;  right     Family History  Problem Relation Age of Onset  . Cancer Father   . COPD Mother     Social History Social History  Substance Use Topics  . Smoking status: Current Every Day Smoker -- 1.00 packs/day for 40 years    Types: Cigarettes  . Smokeless tobacco: Never Used  . Alcohol Use: No    No Known Allergies  Current Outpatient Prescriptions  Medication Sig Dispense Refill  . aspirin EC 81 MG tablet Take 81 mg by mouth daily.    Marland Kitchen HYDROcodone-acetaminophen (NORCO/VICODIN) 5-325 MG per tablet Take 1 tablet by mouth every 6 (six) hours as needed for moderate pain.    Marland Kitchen insulin aspart (NOVOLOG) 100 UNIT/ML FlexPen Inject 8 Units into the skin 4 (four) times daily.    . Insulin Glargine (LANTUS) 100 UNIT/ML Solostar Pen Inject 20 Units into the skin at bedtime.     . levETIRAcetam (KEPPRA) 500 MG tablet Take 1 tablet (500 mg total) by mouth 2 (two) times daily. 30 tablet 0  . metFORMIN (GLUCOPHAGE) 500 MG tablet Take 2 tablets (1,000 mg total) by mouth 2 (two) times daily with a meal. (Patient taking differently: Take 1,000 mg by mouth every morning. )    . olmesartan-hydrochlorothiazide (BENICAR HCT) 20-12.5 MG per tablet Take 1 tablet by mouth daily.    . rosuvastatin (CRESTOR) 20 MG tablet Take 20 mg by  mouth daily.    Marland Kitchen venlafaxine XR (EFFEXOR-XR) 75 MG 24 hr capsule Take 75 mg by mouth daily with breakfast.     No current facility-administered medications for this visit.       Physical Exam Physical Exam Blood pressure 136/86, height  (1.6 m), weight 214 lb (97.07 kg). Appearance, there are no abnormalities in terms of appearance the patient was well-developed and well-nourished. The grooming and hygiene were normal.  Mental status orientation, there was normal alertness and orientation Mood pleasant Ambulatory status normal with no assistive devices  Examination of the left hand reveals mild swelling of the left hand it is diffuse. She has no deformity of the hand  but when asked to flex only bends the IP joints slightly complains of pain with passive motion which is full. Watson test for instability normal. Motor exam no atrophy could not make a fist to test grip strength Skin warm dry and intact without laceration or ulceration or erythema Neurologic examination normal sensation Vascular examination normal pulses with warm extremity and normal capillary refill  The opposite extremity right hand range of motion stability strength normal    Data Reviewed Plain films were done at the hospital and they show mild arthritis no acute fractures no chronic fractures  Assessment  Post traumatic stiffness left upper extremity   Plan  Recommend occupational therapy 3 times a week for 6 weeks  No follow-up needed

## 2015-03-29 NOTE — Therapy (Signed)
[facility] [address] [place], [place], [address] Phone: [phone]   Fax:  [phone]  Occupational Therapy Evaluation  Patient Details  Name: [name] MRN: [id number] Date of Birth: 24-Feb-1956 Referring Provider:  Vickki Hearing, MD  Encounter Date: 03/29/2015      OT End of Session - 03/29/15 1248    Visit Number 1   Number of Visits 1   Authorization Type Medicaid   Authorization Time Period 1 eval approved only   Authorization - Visit Number 1   Authorization - Number of Visits 1   OT Start Time 1100   OT Stop Time 1145   OT Time Calculation (min) 45 min   Activity Tolerance Patient limited by pain   Behavior During Therapy Restless  distractable      Past Medical History  Diagnosis Date  . Type 2 diabetes mellitus (HCC)   . Essential hypertension, benign   . Mixed hyperlipidemia   . Diabetes mellitus   . Arthritis     degenerative lumbar spine   . Pneumonia 2014    [facility]- admitted for 4 days  . Depression   . Headache     headache- R side of head  . H/O cardiovascular stress test 2012    [facility]    Past Surgical History  Procedure Laterality Date  . Umbilical hernia repair  2008  . Bilateral tubal ligation    . Radiology with anesthesia N/A 09/07/2014    Procedure: Embolization Stent Supported Coil of Aneurysm;  Surgeon: Lisbeth Renshaw, MD;  Location: [facility];  Service: Radiology;  Laterality: N/A;  . Craniotomy Right 02/15/2015    Procedure: Right Frontotemporal Craniotomy for aneurysm clipping;  Surgeon: Lisbeth Renshaw, MD;  Location: [facility];  Service: Neurosurgery;  Laterality: Right;  right    There were no vitals filed for this visit.  Visit Diagnosis:  Stiffness of hand joint, left - Plan: Ot plan of care cert/re-cert  Pain, hand joint, left - Plan: Ot plan of care cert/re-cert  Weakness of left hand - Plan: Ot plan of care cert/re-cert      Subjective Assessment - 03/29/15  1227    Subjective  S: I can't do anything with this hand.    Pertinent History Patient is a 59 y/o female S/P stiffness of the left hand joint caused after falling down her ex-husband's steps on 06/09/2014. Per medical chart, an X-ray was performed on 06/09/14 which showed changes suggestive of an avulsion fracture at the base of the third middle phalanx. Last X-ray completed on 01/29/15 showed no acute abnormality. Diffuse mild degenerative change. Pt reports that she is unable to use her left hand for anything. She has increased sensitivity and pain. Patient is unable to hold onto items as she drops them. Dr. Romeo Apple has referred patient to occupational therapy for evaluation and treatment.    Special Tests Pinch and grip strengthening, 9 hole peg test   Patient Stated Goals To be able to use left hand normally.    Currently in Pain? Yes   Pain Score 8    Pain Location Hand   Pain Orientation Left   Pain Descriptors / Indicators Constant;Discomfort;Sore;Stabbing   Pain Type Chronic pain   Pain Onset More than a month ago   Pain Frequency Constant           [facility] OT Assessment - 03/29/15 1237    Assessment   Diagnosis Left hand stiffness - joint  Onset Date 06/09/14   Prior Therapy None    Precautions   Precautions None   Restrictions   Weight Bearing Restrictions No   Balance Screen   Has the patient fallen in the past 6 months No   Prior Function   Level of Independence Independent   Vocation --  in the process of obtaining disability.    ADL   ADL comments Patient reports that she is unable to use her left hand for any daily task due to increased pain and weakness.    Mobility   Mobility Status Independent   Written Expression   Dominant Hand Right   Vision - History   Baseline Vision No visual deficits   Cognition   Overall Cognitive Status Within Functional Limits for tasks assessed   Sensation   Additional Comments Patient reports increased sensitivity in her  entire hand. Unable to pick things up as she is unaware when she is holding them.    Coordination   9 Hole Peg Test Right;Left   Right 9 Hole Peg Test 25 seconds   Left 9 Hole Peg Test 1'25"   Tremors None noted.   Edema   Edema MCP joint R: 18.0 cm L: 18.5 cm   ROM / Strength   AROM / PROM / Strength AROM;PROM;Strength   Palpation   Palpation comment Mod fascial restrictions in Zone II on volar aspect of left hand at the base of 2-5th digits.    AROM   Overall AROM  Deficits   Overall AROM Comments Patient has full ROM of left wrist (extension/flexion, pronation/supination, radial/ulnar deviation). Patient is able to fully extend all digits of left hand. Thumb opposition is normal. Patient is able to complete a fist to 50% of range.   PROM   Overall PROM Comments Patient is able to passive range hand into a full fist with increased pain.    Strength   Strength Assessment Site Hand   Right/Left hand Right;Left   Right Hand Grip (lbs) 55   Right Hand Lateral Pinch 18 lbs   Right Hand 3 Point Pinch 14 lbs   Left Hand Grip (lbs) 5   Left Hand Lateral Pinch 2 lbs   Left Hand 3 Point Pinch 1 lbs   Sensation Exercises   Desensitization Pt was given education regarding desensitization.             OT Education - 03/29/15 1245    Education provided Yes   Education Details tendon glide exercises, P/ROM of digits, contrast bath, desensitization techniques, coordination activities, yellow theraputty exercises. Pt was given an edema glove and educated on wear schedule and cleaning. Disccussed in detail all education handouts and exercises. All questions answered.    Person(s) Educated Patient   Methods Explanation;Demonstration;Handout   Comprehension Verbalized understanding          OT Short Term Goals - 03/29/15 1257    OT SHORT TERM GOAL #1   Title Patient will be educated and independent with HEP.   Time 1   Period Days   Status Achieved                   Plan - 03/29/15 1249    Clinical Impression Statement A: Patient is a 59 y/o female S/P left hand stiffness causing increased pain, edema, sensitivity, and fascial restrictions and decreased strength, sensation, and ROM resulting in difficulty completing daily tasks using LUE as non-dominant.    Pt will benefit from skilled therapeutic  intervention in order to improve on the following deficits (Retired) Pain;Impaired sensation;Decreased strength;Impaired UE functional use;Increased edema;Decreased range of motion;Increased fascial restricitons;Decreased coordination   Rehab Potential Fair   OT Frequency One time visit   OT Duration --  1 day   OT Treatment/Interventions Patient/family education   Plan P: 1 time visit with HEP. (After discussing insurance coverage patient was unable to afford to self pay for therapy visits.)   Consulted and Agree with Plan of Care Patient        Problem List Patient Active Problem List   Diagnosis Date Noted  . Cerebral aneurysm, nonruptured 02/15/2015  . Cerebral aneurysm 09/07/2014    Limmie Patricia, OTR/L,CBIS  [phone]  03/29/2015, 12:59 PM  [facility] [address] [place], [place], [address] Phone: [phone]   Fax:  [phone]

## 2015-03-29 NOTE — Patient Instructions (Addendum)
DESENSITIZING YOUR HAND  What is "desnesitization"?  Following an injury to the hand a painful increase in sensation sometimes develops.  This hypersensitivity may be in response to touch, cold temperatures or in severe cases, even to wind blowing on the hand.  This hypersensitivity may be localized to a small area or may be widespread over an entire hand.  The initial injury causing the hypersensitivity may either be a rather severe injury but may times is only a trivial injury.  Desensitization is the process of decreasing the painful hypersensitivity by gradually exposing the sensitive area to a variety of carefully selected stimuli.  What will happen if my hand is not desensitized?  Over-protecting the sensitive are by not using it or covering it with a bandage or glove will only cause the sensitive area to become more painful to touch.  This, in turn, leads to increased stiffness, weakness and loss of use in the hand.  How do I desensitize my hand?  The goal of desensitization is to gradually build up a tolerance to stronger and stronger stimuli as the level of pain decreases.  A variety of objects of different textures are used to rub against the sensitive hand in such a way that the coarseness of the textures and the rubbing time are gradually increased as the pain diminishes over a period of days and weeks.  The following steps have been set up by your hand therapist and surgeon to serve as a general guide during your therapy and to help you better understand the proper techniques involved so that ultimately a more pain-free, useful hand is obtained.  1. Warm (not hot) water soaks or heating pad on a low setting. 2. Lotion 3. Rubbing exercises:  Using your normal hand, gently rub the most hypersensitive area with a back and forth motion for 30 seconds, rest briefly, and then repeat.  As tolerance to rubbing improves, slowly increase the pressure of rubbing one hand against the  other.  Gradually work up to a deep circular massage.  Once deep massage is tolerated, advance to gently rubbing the sensitive hand against selected materials with increasingly rougher surfaces as indicated below.  It is important to begin with soft, smooth textures and gradually work up to rough, hard textures.  Suggested sequence of desensitization materials: 1)  Cotton balls  2)  Flannel shirt or cotton shirt  3)  Soft velvet or felt  4)  Pants  5)  Towel  6)  Upholstery or rug 7)  Sand paper   Submerge hand into containers of the following household materials and slowly move your fingers about, grasping the materials and releasing them, it is beneficial to submerge the hand quickly and then pull it back out... Work on increasing speed!  The following materials are recommended: cotton balls, popcorn, rice, macaroni, beans or sand.  Rub a brush along the hand.  Massage the hand with vibrating instruments such as an Neurosurgeon, electric toothbrush or vibrator.  Tapping exercises: finger can be tapped against the various textures listed above.  Gradually increase the speed and force used with tapping.  To improve your fine motor coordination and to encourage normal use of the hand the following exercises are useful: 1.  Nuts and bolts 2.  Clothes pins 3.  Picking up  4.  Using a typewriter  5.  Piano  Avoid 1. Hot water.  This may increase swelling and cause more pain and stiffness. 2. Painful activities.  Desensitization  may be somewhat uncomfortable, but it should not actually be painful. 3. Over-protection. Again, covering the sensitive area or avoiding use of the painful finger or hand will only increase painful sensitivity.  Therefore, excessive pain, heat and over-protection may do more harm than good.  You should not experience increased pain and swelling following your desensitization exercises.  Your therapist or surgeon will tailor the protocol to help meet your specific  needs.  Do not hesitate to ask them any questions that you may have.  USE THE HAND AS NORMALLY AS POSSIBLE     Home Exercises Program Theraputty Exercises  Do the following exercises 2-16mes a day using your affected hand. Spend no more than 30 minutes. 1. Roll putty into a ball.  2. Make into a pancake.  3. Roll putty into a roll.  4. Pinch along log with first finger and thumb.   5. Make into a ball.  6. Roll it back into a log.   7. Pinch using thumb and side of first finger.  8. Roll into a ball, then flatten into a pancake.  9. Using your fingers, make putty into a mountain.    Coordination Activities  Perform the following activities for 30  minutes 2-3 times per day with left hand(s).   Flip cards 1 at a time as fast as you can.  Deal cards with your thumb (Hold deck in hand and push card off top with thumb).  Rotate card in hand (clockwise and counter-clockwise).  Shuffle cards.  Pick up coins, buttons, marbles, dried beans/pasta of different sizes and place in container.  Pick up coins and place in container or coin bank.  Pick up coins and stack.  Pick up coins one at a time until you get 5-10 in your hand, then move coins from palm to fingertips to stack one at a time.

## 2015-03-31 ENCOUNTER — Encounter (HOSPITAL_COMMUNITY): Payer: Self-pay | Admitting: *Deleted

## 2015-03-31 ENCOUNTER — Emergency Department (HOSPITAL_COMMUNITY)
Admission: EM | Admit: 2015-03-31 | Discharge: 2015-03-31 | Disposition: A | Discharge status: Home / Self Care | Payer: Medicaid Other | Attending: Emergency Medicine | Admitting: Emergency Medicine

## 2015-03-31 ENCOUNTER — Emergency Department (HOSPITAL_COMMUNITY): Payer: Medicaid Other

## 2015-03-31 DIAGNOSIS — Z9889 Other specified postprocedural states: Secondary | ICD-10-CM | POA: Diagnosis not present

## 2015-03-31 DIAGNOSIS — Z87828 Personal history of other (healed) physical injury and trauma: Secondary | ICD-10-CM | POA: Insufficient documentation

## 2015-03-31 DIAGNOSIS — E119 Type 2 diabetes mellitus without complications: Secondary | ICD-10-CM | POA: Insufficient documentation

## 2015-03-31 DIAGNOSIS — I10 Essential (primary) hypertension: Secondary | ICD-10-CM | POA: Diagnosis not present

## 2015-03-31 DIAGNOSIS — Z794 Long term (current) use of insulin: Secondary | ICD-10-CM | POA: Diagnosis not present

## 2015-03-31 DIAGNOSIS — Z79899 Other long term (current) drug therapy: Secondary | ICD-10-CM | POA: Insufficient documentation

## 2015-03-31 DIAGNOSIS — M1990 Unspecified osteoarthritis, unspecified site: Secondary | ICD-10-CM | POA: Diagnosis not present

## 2015-03-31 DIAGNOSIS — Z8701 Personal history of pneumonia (recurrent): Secondary | ICD-10-CM | POA: Insufficient documentation

## 2015-03-31 DIAGNOSIS — F329 Major depressive disorder, single episode, unspecified: Secondary | ICD-10-CM | POA: Diagnosis not present

## 2015-03-31 DIAGNOSIS — G43009 Migraine without aura, not intractable, without status migrainosus: Secondary | ICD-10-CM | POA: Diagnosis present

## 2015-03-31 DIAGNOSIS — E782 Mixed hyperlipidemia: Secondary | ICD-10-CM | POA: Diagnosis not present

## 2015-03-31 DIAGNOSIS — Z720 Tobacco use: Secondary | ICD-10-CM | POA: Insufficient documentation

## 2015-03-31 DIAGNOSIS — Z7982 Long term (current) use of aspirin: Secondary | ICD-10-CM | POA: Insufficient documentation

## 2015-03-31 LAB — CBG MONITORING, ED: Glucose-Capillary: 300 mg/dL — ABNORMAL HIGH (ref 65–99)

## 2015-03-31 MED ORDER — SODIUM CHLORIDE 0.9 % IV BOLUS (SEPSIS)
1000.0000 mL | Freq: Once | INTRAVENOUS | Status: AC
  Administered 2015-03-31: 1000 mL via INTRAVENOUS

## 2015-03-31 MED ORDER — METOCLOPRAMIDE HCL 5 MG/ML IJ SOLN
10.0000 mg | Freq: Once | INTRAMUSCULAR | Status: AC
  Administered 2015-03-31: 10 mg via INTRAVENOUS
  Filled 2015-03-31: qty 2

## 2015-03-31 MED ORDER — KETOROLAC TROMETHAMINE 30 MG/ML IJ SOLN
30.0000 mg | Freq: Once | INTRAMUSCULAR | Status: AC
  Administered 2015-03-31: 30 mg via INTRAVENOUS
  Filled 2015-03-31: qty 1

## 2015-03-31 MED ORDER — DIPHENHYDRAMINE HCL 50 MG/ML IJ SOLN
25.0000 mg | Freq: Once | INTRAMUSCULAR | Status: AC
Start: 2015-03-31 — End: 2015-03-31
  Administered 2015-03-31: 25 mg via INTRAVENOUS
  Filled 2015-03-31: qty 1

## 2015-03-31 NOTE — ED Provider Notes (Signed)
CSN: [id number]     Arrival date & time 03/31/15  0106 History   First MD Initiated Contact with Patient 03/31/15 0135    Chief Complaint  Patient presents with  . Headache     (Consider location/radiation/quality/duration/timing/severity/associated sxs/prior Treatment) HPI patient reports she had a craniotomy done on August 25 to have a aneurysm clip. She states she is still having the same pain in her surgical site that she has had since surgery. She reports she last saw her neurosurgeon about 3 weeks ago. She states she ran out of her hydrocodone 2-3 days ago. She denies any fever. She states sometimes she gets nausea but not today. She denies vomiting. She denies any new numbness or tingling of her extremities. She's denies having difficulty walking. She also complains of her left hand being swollen since she had a fall in December 2015. She states she needs "just enough pain medicine to get through the weekend".  PCP Dr Renard Matter Neurosurgery Dr Conchita Paris  Past Medical History  Diagnosis Date  . Type 2 diabetes mellitus (HCC)   . Essential hypertension, benign   . Mixed hyperlipidemia   . Diabetes mellitus   . Arthritis     degenerative lumbar spine   . Pneumonia 2014    [facility]- admitted for 4 days  . Depression   . Headache     headache- R side of head  . H/O cardiovascular stress test 2012    [facility]   Past Surgical History  Procedure Laterality Date  . Umbilical hernia repair  2008  . Bilateral tubal ligation    . Radiology with anesthesia N/A 09/07/2014    Procedure: Embolization Stent Supported Coil of Aneurysm;  Surgeon: Lisbeth Renshaw, MD;  Location: [facility] OR;  Service: Radiology;  Laterality: N/A;  . Craniotomy Right 02/15/2015    Procedure: Right Frontotemporal Craniotomy for aneurysm clipping;  Surgeon: Lisbeth Renshaw, MD;  Location: [facility];  Service: Neurosurgery;  Laterality: Right;  right   Family History  Problem Relation Age of Onset  . Cancer Father   .  COPD Mother    Social History  Substance Use Topics  . Smoking status: Current Every Day Smoker -- 1.00 packs/day for 40 years    Types: Cigarettes  . Smokeless tobacco: Never Used  . Alcohol Use: No   OB History    Gravida Para Term Preterm AB TAB SAB Ectopic Multiple Living   Review of Systems  All other systems reviewed and are negative.     Allergies  Review of patient's allergies indicates no known allergies.  Home Medications   Prior to Admission medications   Medication Sig Start Date End Date Taking? Authorizing Provider  aspirin EC 81 MG tablet Take 81 mg by mouth daily.    Historical Provider, MD  HYDROcodone-acetaminophen (NORCO/VICODIN) 5-325 MG per tablet Take 1 tablet by mouth every 6 (six) hours as needed for moderate pain.    Historical Provider, MD  insulin aspart (NOVOLOG) 100 UNIT/ML FlexPen Inject 8 Units into the skin 4 (four) times daily.    Historical Provider, MD  Insulin Glargine (LANTUS) 100 UNIT/ML Solostar Pen Inject 20 Units into the skin at bedtime.     Historical Provider, MD  levETIRAcetam (KEPPRA) 500 MG tablet Take 1 tablet (500 mg total) by mouth 2 (two) times daily. 02/19/15   Lisbeth Renshaw, MD  metFORMIN (GLUCOPHAGE) 500 MG tablet Take 2 tablets (1,000  mg total) by mouth 2 (two) times daily with a meal. Patient taking differently: Take 1,000 mg by mouth every morning.  09/09/14   Lisbeth Renshaw, MD  olmesartan-hydrochlorothiazide (BENICAR HCT) 20-12.5 MG per tablet Take 1 tablet by mouth daily.    Historical Provider, MD  rosuvastatin (CRESTOR) 20 MG tablet Take 20 mg by mouth daily.    Historical Provider, MD  venlafaxine XR (EFFEXOR-XR) 75 MG 24 hr capsule Take 75 mg by mouth daily with breakfast.    Historical Provider, MD   BP 142/68 mmHg  Pulse 86  Temp(Src) 98.4 F (36.9 C) (Oral)  Resp 20  Ht 5\' 3"  (1.6 m)  Wt 211 lb (95.709 kg)  BMI 37.39 kg/m2  SpO2 98%  Vital signs normal     Physical Exam   Constitutional: She is oriented to person, place, and time. She appears well-developed and well-nourished.  Non-toxic appearance. She does not appear ill. No distress.  HENT:  Head: Normocephalic and atraumatic.  Right Ear: External ear normal.  Left Ear: External ear normal.  Nose: Nose normal. No mucosal edema or rhinorrhea.  Mouth/Throat: Oropharynx is clear and moist and mucous membranes are normal. No dental abscesses or uvula swelling.  Patient has a linear area just inside her scalp line of her forehead with scab still present.  Eyes: Conjunctivae and EOM are normal. Pupils are equal, round, and reactive to light.  Neck: Normal range of motion and full passive range of motion without pain. Neck supple.  Cardiovascular: Normal rate, regular rhythm and normal heart sounds.  Exam reveals no gallop and no friction rub.   No murmur heard. Pulmonary/Chest: Effort normal and breath sounds normal. No respiratory distress. She has no wheezes. She has no rhonchi. She has no rales. She exhibits no tenderness and no crepitus.  Abdominal: Soft. Normal appearance and bowel sounds are normal. She exhibits no distension. There is no tenderness. There is no rebound and no guarding.  Musculoskeletal: Normal range of motion. She exhibits no edema or tenderness.  Moves all extremities well.   Neurological: She is alert and oriented to person, place, and time. She has normal strength. No cranial nerve deficit.  Skin: Skin is warm, dry and intact. No rash noted. No erythema. No pallor.  Psychiatric: She has a normal mood and affect. Her speech is normal and behavior is normal. Her mood appears not anxious.  Nursing note and vitals reviewed.     Procedures (including critical care time) Medications  sodium chloride 0.9 % bolus 1,000 mL (1,000 mLs Intravenous New Bag/Given 03/31/15 0244)  metoCLOPramide (REGLAN) injection 10 mg (10 mg Intravenous Given 03/31/15 0247)  diphenhydrAMINE (BENADRYL) injection  25 mg (25 mg Intravenous Given 03/31/15 0245)  ketorolac (TORADOL) 30 MG/ML injection 30 mg (30 mg Intravenous Given 03/31/15 0244)    Review of patient's record shows she was seen by the orthopedist on October 6 for her left hand. She also had a physical therapy appointment that same day.  2:30 AM I saw this patient on September 1 with headache that she described at that point is throbbing and migraine and quality. When I go back and talk to her she states the headache she has tonight is throbbing and like the headache she had then. Patient was given migraine cocktail for her headache.  Recheck at 4:30 AM patient states her headache is improved and feels ready to be discharged. We discussed she would need to contact her own physicians to get more of her chronic  pain medication.   Review of the [place] shows patient has received a narcotic prescriptions since April 19. She gets #60 hydrocodone 5/325 approximately every 2 weeks from her neurosurgeon's office. They were last filled on September 23. She also got an additional 40 tablets on September 15 from her PCP.  Labs Review Labs Reviewed  CBG MONITORING, ED - Abnormal; Notable for the following:    Glucose-Capillary 300 (*)    All other components within normal limits   Laboratory interpretation all normal except hyperglycemia  Imaging Review Ct Head Wo Contrast  03/31/2015   CLINICAL DATA:  Headache.  EXAM: CT HEAD WITHOUT CONTRAST  TECHNIQUE: Contiguous axial images were obtained from the base of the skull through the vertex without intravenous contrast.  COMPARISON:  02/22/2015, 09/27/2014  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 There is right frontotemporal craniotomy. The postsurgical changes have diminished since 02/22/2015. There continues to be a small residual volume of hyperdense material at the surface of the brain underlying the craniotomy, and at the anterior margin of the  right temporal lobe. This is reduced in volume and is consistent with evolution of the postoperative changes. There is no CT evidence of recurrent hemorrhage. There is no mass effect or midline shift. Ventricles and basal cisterns are normal in size and configuration. Gray matter and white matter are unremarkable.  IMPRESSION: Decreasing postsurgical changes associated with the right frontotemporal craniotomy and right MCA aneurysm clip. No evidence of recurrent hemorrhage. No acute findings.   Electronically Signed   By: Ellery Plunk M.D.   On: 03/31/2015 02:14   I have personally reviewed and evaluated these images and lab results as part of my medical decision-making.   EKG Interpretation None      MDM ED Course   patient presents with chronic headache after having craniotomy 2 months ago. She ran out of her narcotic pain medication at least 3 days ago per patient. Her headache improved with migraine cocktail medication. She is discharged home to follow-up with her doctor who prescribes her pain medication.   Final diagnoses:  Migraine without aura and without status migrainosus, not intractable    Plan discharge  Devoria Albe, MD, Concha Pyo, MD 03/31/15 [phone]

## 2015-03-31 NOTE — ED Notes (Signed)
Pt c/o headache; pt had surgery for aneurysm on August 25th and just had her staple removed x 1 week ago

## 2015-03-31 NOTE — ED Notes (Signed)
Dr Knapp at bedside,  

## 2015-03-31 NOTE — Discharge Instructions (Signed)
Go home and rest. You can take acetaminophen 650 mg 4 times a day for pain as needed. You will need to contact your doctor to get more of your pain medication. Please look at our ED pain medication statement.  Chronic Pain Discharge Instructions  Emergency care providers appreciate that many patients coming to [place] are in severe pain and we wish to address their pain in the safest, most responsible manner.  It is important to recognize however, that the proper treatment of chronic pain differs from that of the pain of injuries and acute illnesses.  Our goal is to provide quality, safe, personalized care and we thank you for giving [place] the opportunity to serve you. The use of narcotics and related agents for chronic pain syndromes may lead to additional physical and psychological problems.  Nearly as many people die from prescription narcotics each year as die from car crashes.  Additionally, this risk is increased if such prescriptions are obtained from a variety of sources.  Therefore, only your primary care physician or a pain management specialist is able to safely treat such syndromes with narcotic medications long-term.    Documentation revealing such prescriptions have been sought from multiple sources may prohibit [place] from providing a refill or different narcotic medication.  Your name may be checked first through the [facility] Controlled Substances Reporting System.  This database is a record of controlled substance medication prescriptions that the patient has received.  This has been established by [facility] in an effort to eliminate the dangerous, and often life threatening, practice of obtaining multiple prescriptions from different medical providers.   If you have a chronic pain syndrome (i.e. chronic headaches, recurrent back or neck pain, dental pain, abdominal or pelvis pain without a specific diagnosis, or neuropathic pain such as fibromyalgia) or recurrent visits for the same  condition without an acute diagnosis, you may be treated with non-narcotics and other non-addictive medicines.  Allergic reactions or negative side effects that may be reported by a patient to such medications will not typically lead to the use of a narcotic analgesic or other controlled substance as an alternative.   Patients managing chronic pain with a personal physician should have provisions in place for breakthrough pain.  If you are in crisis, you should call your physician.  If your physician directs you to the emergency department, please have the doctor call and speak to our attending physician concerning your care.   When patients come to the Emergency Department (ED) with acute medical conditions in which the Emergency Department physician feels appropriate to prescribe narcotic or sedating pain medication, the physician will prescribe these in very limited quantities.  The amount of these medications will last only until you can see your primary care physician in his/her office.  Any patient who returns to the ED seeking refills should expect only non-narcotic pain medications.   In the event of an acute medical condition exists and the emergency physician feels it is necessary that the patient be given a narcotic or sedating medication -  a responsible adult driver should be present in the room prior to the medication being given by the nurse.   Prescriptions for narcotic or sedating medications that have been lost, stolen or expired will not be refilled in the Emergency Department.    Patients who have chronic pain may receive non-narcotic prescriptions until seen by their primary care physician.  It is every patients personal responsibility to maintain active prescriptions with his or  her primary care physician or specialist.

## 2015-03-31 NOTE — ED Notes (Signed)
Pt c/o pain at incision site and also requesting to have her blood sugar checked, states that she thinks it is high,

## 2015-03-31 NOTE — ED Notes (Signed)
Pt updated on plan of care,  

## 2015-04-02 ENCOUNTER — Telehealth: Payer: Self-pay | Admitting: Orthopedic Surgery

## 2015-04-02 ENCOUNTER — Other Ambulatory Visit: Payer: Self-pay | Admitting: *Deleted

## 2015-04-02 NOTE — Telephone Encounter (Signed)
Dr Romeo Apple did not prescribe, no refill

## 2015-04-02 NOTE — Telephone Encounter (Signed)
Patient is calling requesting a refill on medication HYDROcodone-acetaminophen (NORCO/VICODIN) 5-325 MG per tablet please advise? 

## 2015-04-11 IMAGING — CR DG CHEST 2V
2 series · 2 of 2 positions shown · non-contrast
Comparison: 12/10/2011.

CLINICAL DATA: Smoker.  Hypertension.

EXAM:
CHEST  2 VIEW

[w chest pa]
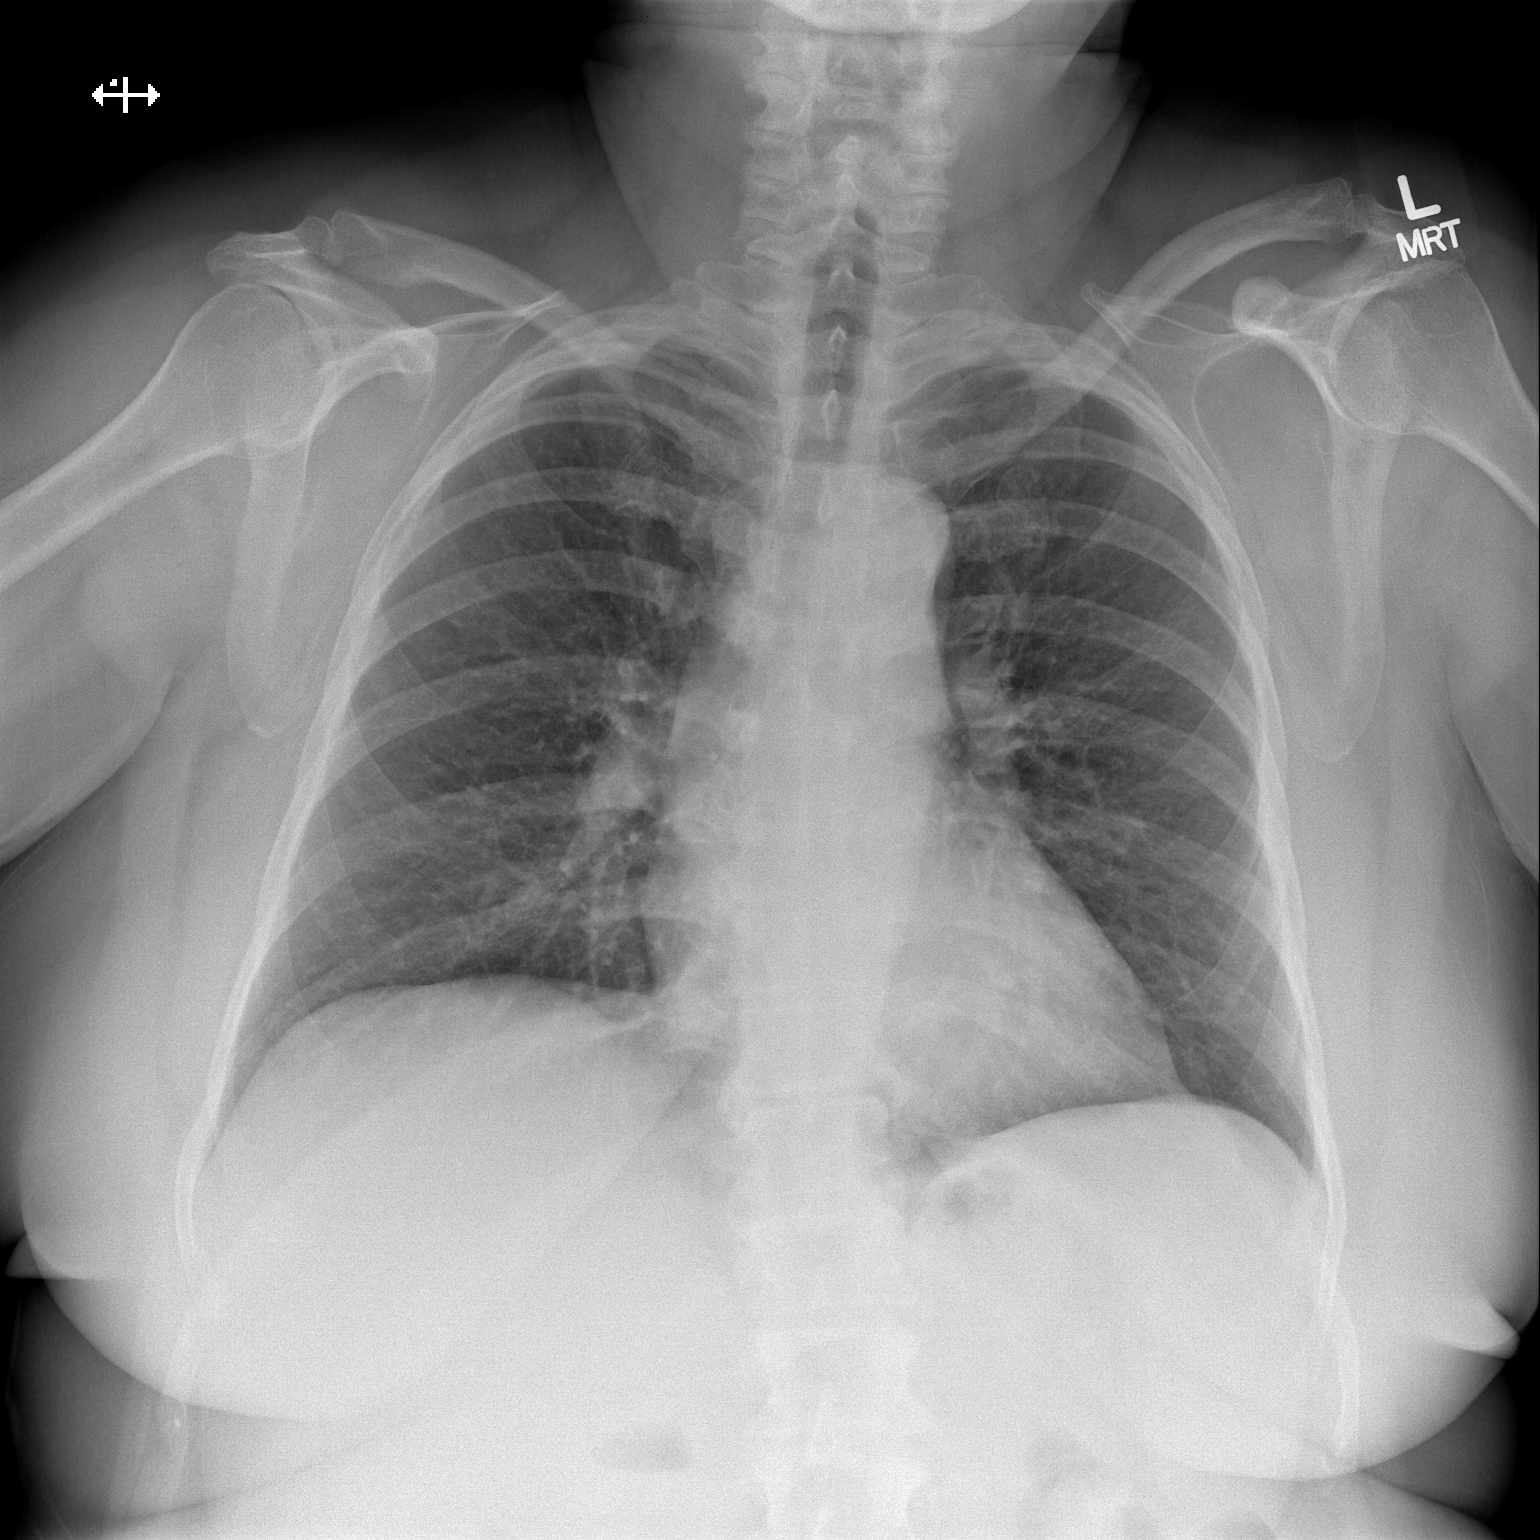

[w chest lat]
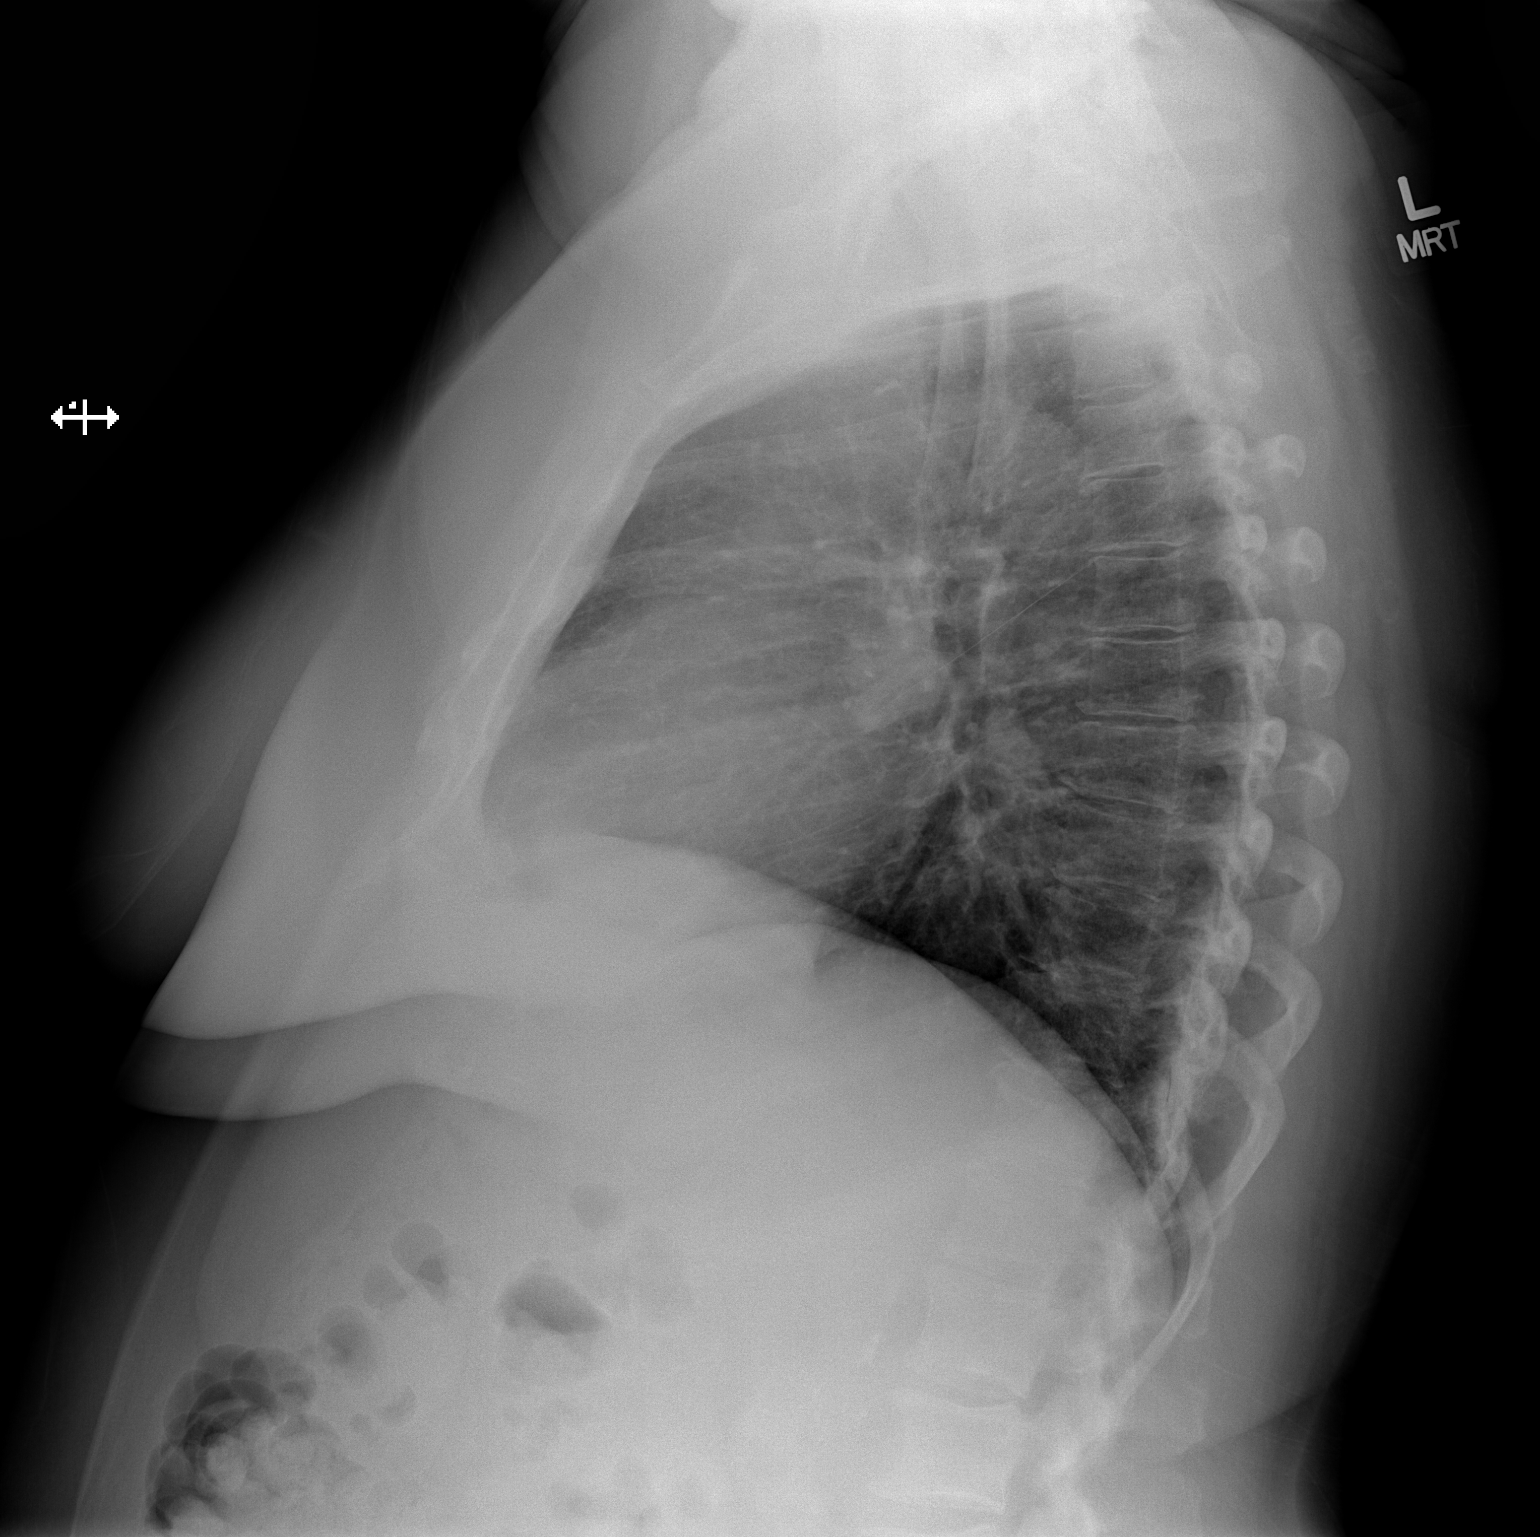

[2 of 2 positions shown; findings below may reference images not displayed]

FINDINGS: Mediastinum and hilar structures normal. Lungs are clear. Heart size
normal. No pleural effusion or pneumothorax. No acute bony
abnormality identified.
IMPRESSION: No acute cardiopulmonary disease.

## 2015-08-27 ENCOUNTER — Encounter (HOSPITAL_COMMUNITY): Payer: Self-pay

## 2015-11-07 ENCOUNTER — Telehealth: Payer: Self-pay | Admitting: *Deleted

## 2015-11-07 NOTE — Telephone Encounter (Signed)
Tina SicilianCaller [name]              CID [id number]  Patient SAME                 Pt's Dr Ellis ParentsNEW PT       Area Code 336 Phone# [phone] * DOB [phone]      RE HAS A REFERRAL/WOULD LIKE TO SCHEDULE AN APPT                                                          Disp:Y/N N If Y = C/B If No Response In 20minutes ============================================================

## 2015-11-10 IMAGING — CT CT HEAD W/O CM
1 series · 16 of 30 positions shown, 20 images · non-contrast
Comparison: 02/22/2015, 09/27/2014

CLINICAL DATA: Headache.

EXAM:
CT HEAD WITHOUT CONTRAST
TECHNIQUE: Contiguous axial images were obtained from the base of the skull
through the vertex without intravenous contrast.

[Series 2: headtrauma 4.8 h37s · axial · 0.43mm/px · z∈[+256,+413]mm · 16 of 36 slices shown, 20 images]
[im 2/36  brain]
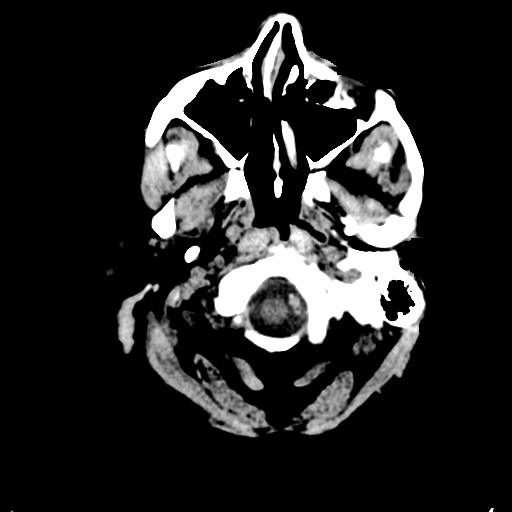
[im 2/36  bone]
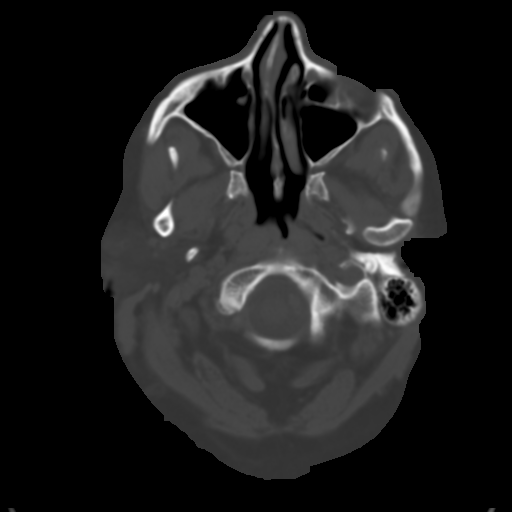
[im 4/36  brain]
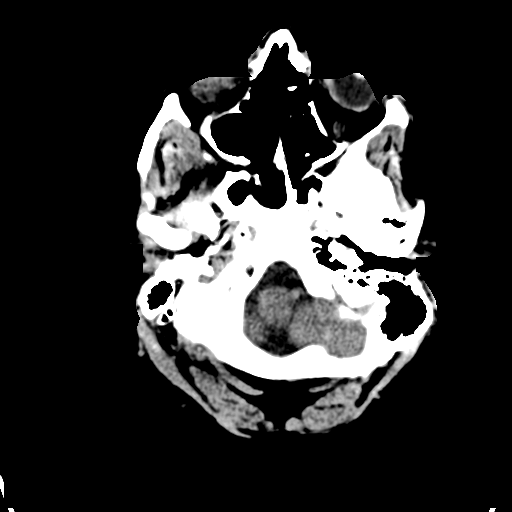
[im 7/36  brain]
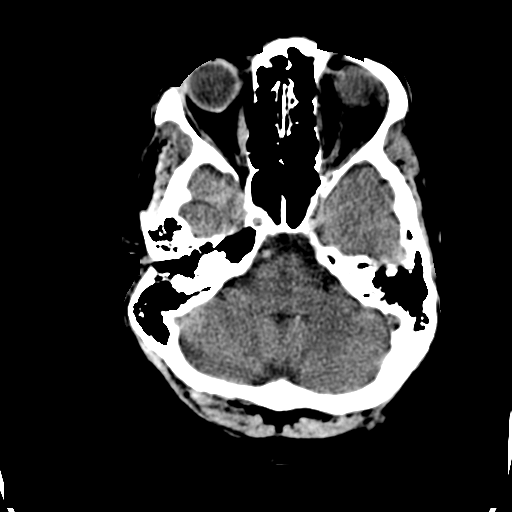
[im 9/36  brain]
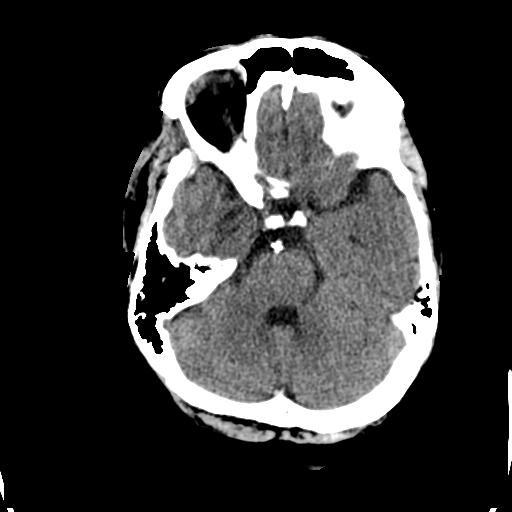
[im 10/36  brain]
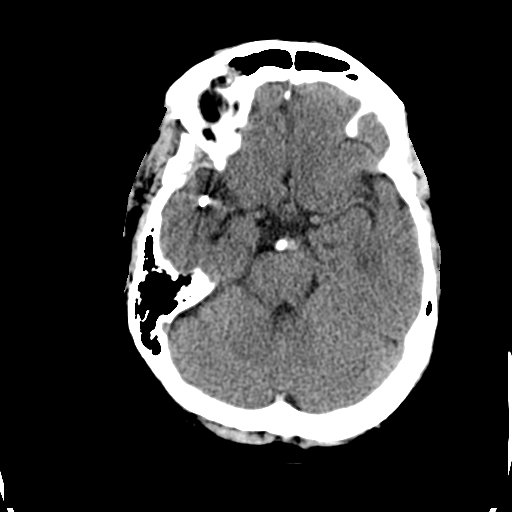
[im 10/36  bone]
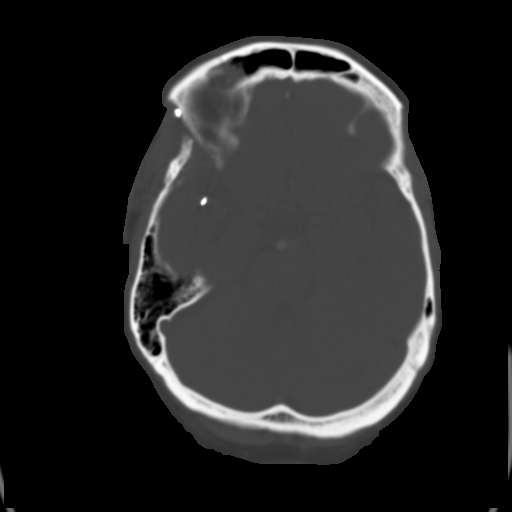
[im 13/36  brain]
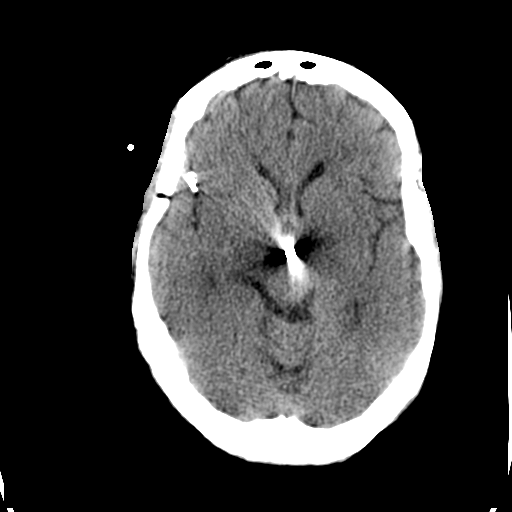
[im 15/36  brain]
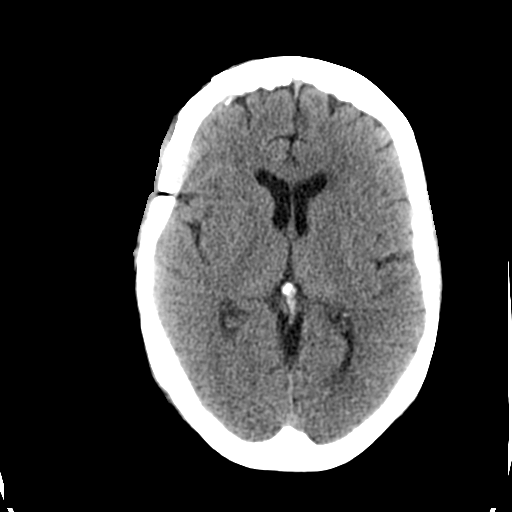
[im 17/36  brain]
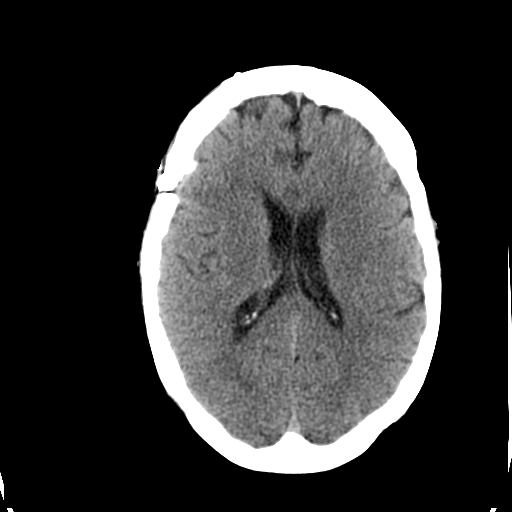
[im 19/36  brain]
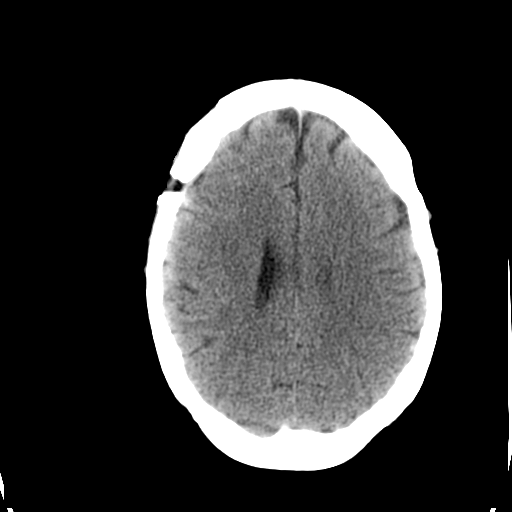
[im 19/36  bone]
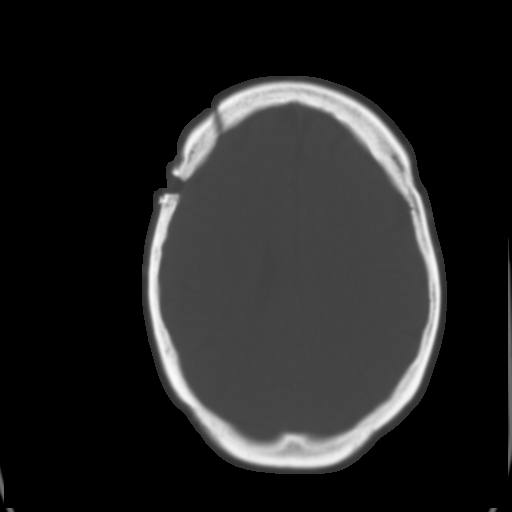
[im 21/36  brain]
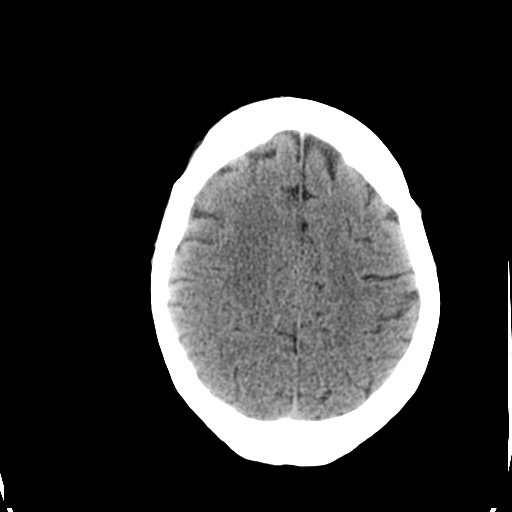
[im 23/36  brain]
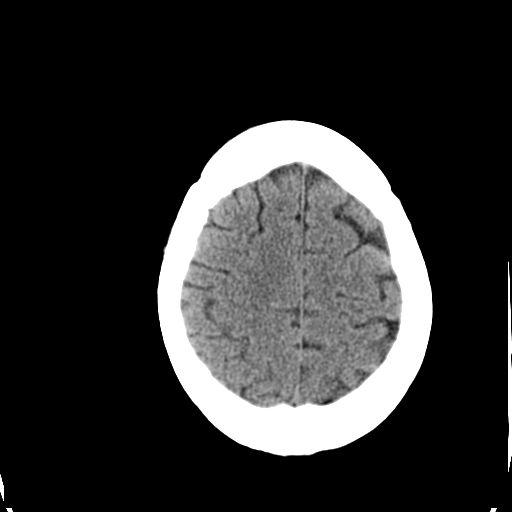
[im 26/36  brain]
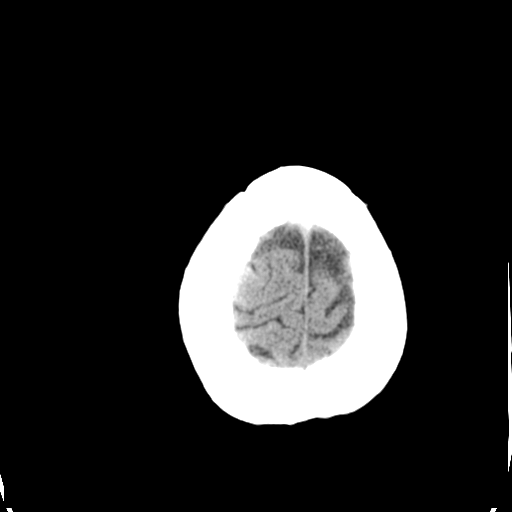
[im 27/36  brain]
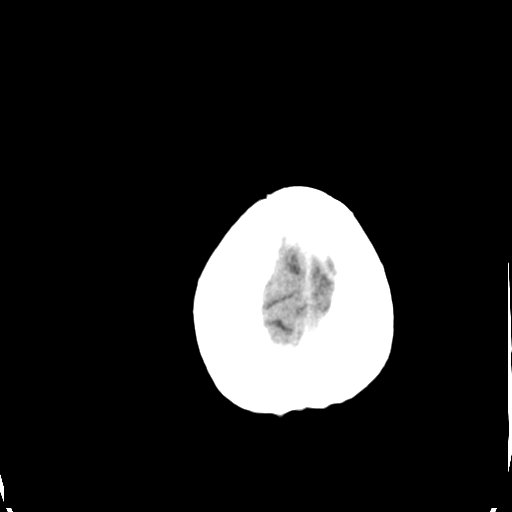
[im 27/36  bone]
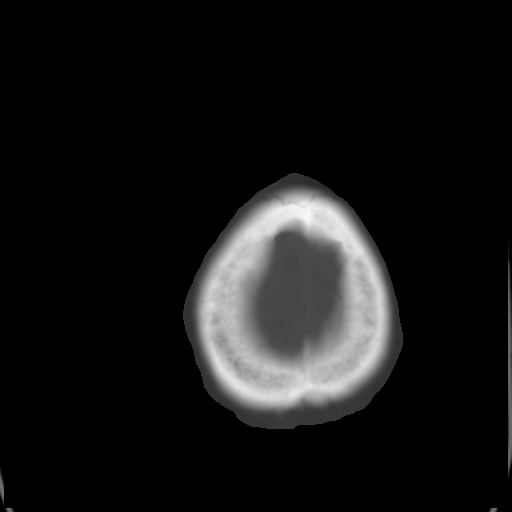
[im 29/36  brain]
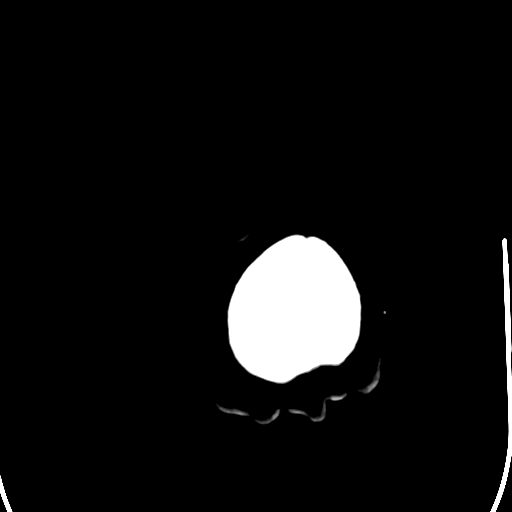
[im 32/36  brain]
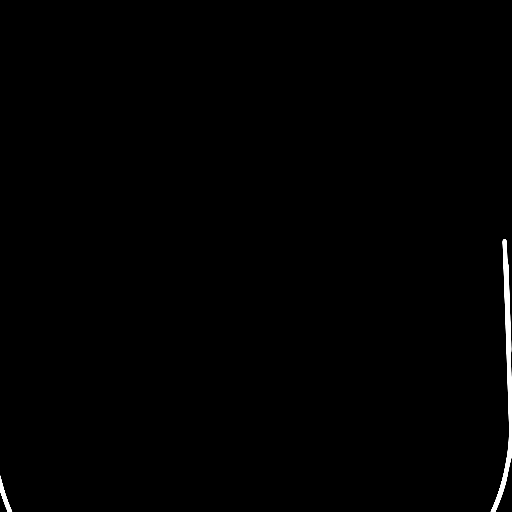
[im 34/36  brain]
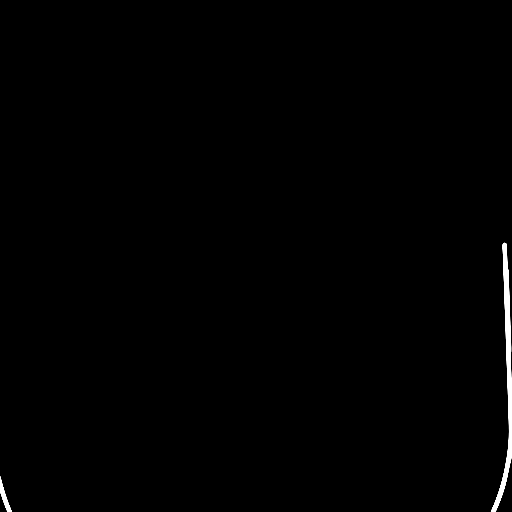

[16 of 30 positions shown; findings below may reference images not displayed]

FINDINGS: There is a right MCA aneurysm clip. There is a basilar tip coil
mass. There is right frontotemporal craniotomy. The postsurgical
changes have diminished since 02/22/2015. There continues to be a
small residual volume of hyperdense material at the surface of the
brain underlying the craniotomy, and at the anterior margin of the
right temporal lobe. This is reduced in volume and is consistent
with evolution of the postoperative changes. There is no CT evidence
of recurrent hemorrhage. There is no mass effect or midline shift.
Ventricles and basal cisterns are normal in size and configuration.
Gray matter and white matter are unremarkable.
IMPRESSION: Decreasing postsurgical changes associated with the right
frontotemporal craniotomy and right MCA aneurysm clip. No evidence
of recurrent hemorrhage. No acute findings.

## 2015-12-13 ENCOUNTER — Ambulatory Visit: Payer: Medicaid Other | Admitting: Neurology

## 2016-02-07 ENCOUNTER — Ambulatory Visit (INDEPENDENT_AMBULATORY_CARE_PROVIDER_SITE_OTHER): Payer: Medicaid Other | Admitting: Neurology

## 2016-02-07 ENCOUNTER — Encounter: Payer: Self-pay | Admitting: Neurology

## 2016-02-07 VITALS — BP 118/78 | HR 80 | Ht 63.0 in | Wt 195.6 lb

## 2016-02-07 DIAGNOSIS — M792 Neuralgia and neuritis, unspecified: Secondary | ICD-10-CM

## 2016-02-07 DIAGNOSIS — G5603 Carpal tunnel syndrome, bilateral upper limbs: Secondary | ICD-10-CM

## 2016-02-07 DIAGNOSIS — G609 Hereditary and idiopathic neuropathy, unspecified: Secondary | ICD-10-CM

## 2016-02-07 MED ORDER — GABAPENTIN 300 MG PO CAPS: 300.0000 mg | ORAL_CAPSULE | Freq: Three times a day (TID) | ORAL | 2 refills | Status: DC

## 2016-02-07 NOTE — Progress Notes (Signed)
[id number]    Provider:  Dr Lucia Gaskins Referring Provider: Milana Obey, MD Primary Care Physician:  Milana Obey, MD  CC:  headache  HPI:  [name] is a 60 y.o. female here as a referral from Dr. Renard Matter for headache. Past medical history diabetes, high cholesterol, obesity., Aneurysm s/p clipping. She is taking acetaminophen hydrocodone for severe headache per pcp note. She has had 2 surgeries for aneurysm clipping and since then she reports headache. However she does not report headache to be significant today, she says shooting pain in the head is not significant and only happens once in a while, brief and she holds it for a little bit and then it goes. She has the shooting sensation sin the head 1-2x week and it is painful but very brief. Not concerning to her. Her bigger concern is weakness of the left hand and pain which is now starting in the right had as well. The headache comes and goes, a sharp pain where she had the surgery. She says she has other headaches but doesn't know what they feel like and again not significant. The left hand is painful, she can;t make a fist, numbness and tingling in the left hand and now in the right hand as well. She reports left >> right hand weakness and dropping objects. No neck pain or radicular symptoms. She drops things from her hands. Left hand pain and weakness started due to trauma she fell on stairs. She is awoken by pain in the middle of the night and has to shake them out. She also has some diabetic neuropathy per patient. No focal neurologic deficits otherwise per patient. No fhx neuropathy.  Reviewed notes, labs and imaging from outside physicians, which showed:  Reviewed notes from primary care. She was given 60 tablets of hydrocodone in the last 3 months. She takes them all. She rates her pain a 7/10 currently. She "has to take them". She has a headache involving the right side of her head which started after  surgery. She also uses pain medication for left wrist pain. She is unable to close her left hand since she had a fall. She is insistent about the need for hydrocodone/APAP. She also insists she is not addicted to it. They ordered a urine drug screen. They have recommended decreasing pain medication and are tapering down.   Hemoglobin A1c 11 in August 2016, BUN 15, creatinine 0.860 August 2016.  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 There is right frontotemporal craniotomy. The postsurgical changes have diminished since 02/22/2015. There continues to be a small residual volume of hyperdense material at the surface of the brain underlying the craniotomy, and at the anterior margin of the right temporal lobe. This is reduced in volume and is consistent with evolution of the postoperative changes. There is no CT evidence of recurrent hemorrhage. There is no mass effect or midline shift. Ventricles and basal cisterns are normal in size and configuration. Gray matter and white matter are unremarkable.  IMPRESSION: Decreasing postsurgical changes associated with the right frontotemporal craniotomy and right MCA aneurysm clip. No evidence of recurrent hemorrhage. No acute findings.   Review of Systems: Patient complains of symptoms per HPI as well as the following symptoms: No CP, no SOB. Pertinent negatives per HPI. All others negative.   Social History   Social History  . Marital  status: Single    Spouse name: N/A  . Number of children: 2  . Years of education: 11   Occupational History  . Unemployed    Social History Main Topics  . Smoking status: Current Every Day Smoker    Packs/day: 1.00    Years: 40.00    Types: Cigarettes  . Smokeless tobacco: Never Used  . Alcohol use No  . Drug use: No  . Sexual activity: Not on file   Other Topics Concern  . Not on file   Social  History Narrative   Lives with boyfriend.   Caffeine use: Drinks coffee/tea (not often per patient)    Family History  Problem Relation Age of Onset  . Cancer Father   . COPD Mother     Past Medical History:  Diagnosis Date  . Arthritis    degenerative lumbar spine   . Depression   . Diabetes mellitus   . Essential hypertension, benign   . H/O cardiovascular stress test 2012   [facility]  . Headache    headache- R side of head  . Mixed hyperlipidemia   . Pneumonia 2014   [facility]- admitted for 4 days  . Type 2 diabetes mellitus (HCC)     Past Surgical History:  Procedure Laterality Date  . Bilateral tubal ligation    . CRANIOTOMY Right 02/15/2015   Procedure: Right Frontotemporal Craniotomy for aneurysm clipping;  Surgeon: Lisbeth RenshawNeelesh Nundkumar, MD;  Location: [facility];  Service: Neurosurgery;  Laterality: Right;  right  . RADIOLOGY WITH ANESTHESIA N/A 09/07/2014   Procedure: Embolization Stent Supported Coil of Aneurysm;  Surgeon: Lisbeth RenshawNeelesh Nundkumar, MD;  [facility];  Service: Radiology;  Laterality: N/A;  . UMBILICAL HERNIA REPAIR  2008    Current Outpatient Prescriptions  Medication Sig Dispense Refill  . atorvastatin (LIPITOR) 20 MG tablet Take 20 mg by mouth daily.    Marland Kitchen. HYDROcodone-acetaminophen (NORCO/VICODIN) 5-325 MG per tablet Take 1 tablet by mouth every 6 (six) hours as needed for moderate pain.    Marland Kitchen. insulin aspart (NOVOLOG) 100 UNIT/ML FlexPen Inject 8 Units into the skin 4 (four) times daily.    . Insulin Glargine (LANTUS) 100 UNIT/ML Solostar Pen Inject 20 Units into the skin at bedtime.     . metFORMIN (GLUCOPHAGE) 500 MG tablet Take 2 tablets (1,000 mg total) by mouth 2 (two) times daily with a meal. (Patient taking differently: Take 1,000 mg by mouth every morning. )    . olmesartan-hydrochlorothiazide (BENICAR HCT) 20-12.5 MG per tablet Take 1 tablet by mouth daily.    . rosuvastatin (CRESTOR) 20 MG tablet Take 20 mg by mouth daily.    Marland Kitchen. venlafaxine XR  (EFFEXOR-XR) 75 MG 24 hr capsule Take 75 mg by mouth daily with breakfast.     No current facility-administered medications for this visit.     Allergies as of 02/07/2016  . (No Known Allergies)    Vitals: BP 118/78 (BP Location: Right Arm, Patient Position: Sitting, Cuff Size: Normal)   Pulse 80   Ht 5\' 3"  (1.6 m)   Wt 195 lb 9.6 oz (88.7 kg)   SpO2 96%   BMI 34.65 kg/m  Last Weight:  Wt Readings from Last 1 Encounters:  02/07/16 195 lb 9.6 oz (88.7 kg)   Last Height:   Ht Readings from Last 1 Encounters:  02/07/16 5\' 3"  (1.6 m)   Physical exam: Exam: Gen: NAD, conversant, well nourised, obese, poorly groomed  CV: RRR, no MRG. No Carotid Bruits. No peripheral edema, warm, nontender Eyes: Conjunctivae clear without exudates or hemorrhage  Neuro: Detailed Neurologic Exam  Speech:    Speech is normal; fluent and spontaneous with normal comprehension.  Cognition:    The patient is oriented to person, place, and time;     recent and remote memory intact;     language fluent;     normal attention, concentration,     fund of knowledge Cranial Nerves:    The pupils are equal, round, and reactive to light. The fundi are normal and spontaneous venous pulsations are present. Visual fields are full to finger confrontation. Extraocular movements are intact. Trigeminal sensation is intact and the muscles of mastication are normal. The face is symmetric. The palate elevates in the midline. Hearing intact. Voice is normal. Shoulder shrug is normal. The tongue has normal motion without fasciculations.   Coordination:    Normal finger to nose and heel to shin.  Gait:    Mildly wide based due to obesity  Motor Observation:    No asymmetry, no atrophy, and no involuntary movements noted. Tone:    Normal muscle tone.    Posture:    Posture is normal. normal erect    Strength: decreased strength of the intrinsic hand muscles LEFT >> right with poor grip  otherwise Strength is V/V in the upper and lower limbs.      Sensation: intact to LT      Reflex Exam:  DTR's:    Deep tendon reflexes in the upper and lower extremities are normal bilaterally.   Toes:    The toes are downgoing bilaterally.   Clonus:    Clonus is absent.   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   - She has shooting pain in the head which says is brief and only happens a few times a week. she doesn't seem to think it is of important or needs to be addressed. She declined any treatment. - Avoid daily acetaminophen and opioids for headaches as can cause rebound/medication overuse. Patient asked me to recommend higher doses of her pain medication I advised her to listen to Dr. Sudie BaileyKnowlton and continue decreasing her dose as he recommends. - EMG/NCS of bilateral upper extremities eval cts vs other cause of hand pain and weakness - wrist splints and conservative measures until can get back for testing - Gabapentin 300mg  tid for pain in hands. Watch for sedation, discussed side effects as per patient instructions.  Cc: Milana ObeyStephen D Knowlton, MD  Naomie DeanAntonia Ray Glacken, MD  [facility] [address] [place], [place] [id number]  Phone [phone] Fax [phone]

## 2016-02-07 NOTE — Patient Instructions (Signed)
Remember to drink plenty of fluid, eat healthy meals and do not skip any meals. Try to eat protein with a every meal and eat a healthy snack such as fruit or nuts in between meals. Try to keep a regular sleep-wake schedule and try to exercise daily, particularly in the form of walking, 20-30 minutes a day, if you can.   As far as your medications are concerned, I would like to suggest Neurontin/gabapentin 300mg  up to three times a day  I would like to see you back for emg/ncs, sooner if we need to. Please call us with any interim questions, concerns, problems, updates or refill requests.   Our phone number is [phone]. We also have an after hours call service for urgent matters and there is a physician on-call for urgent questions. For any emergencies you know to call 911 or go to the nearest emergency room  Gabapentin capsules or tablets What is this medicine? GABAPENTIN (GA ba pen tin) is used to control partial seizures in adults with epilepsy. It is also used to treat certain types of nerve pain. This medicine may be used for other purposes; ask your health care provider or pharmacist if you have questions. What should I tell my health care provider before I take this medicine? They need to know if you have any of these conditions: -kidney disease -suicidal thoughts, plans, or attempt; a previous suicide attempt by you or a family member -an unusual or allergic reaction to gabapentin, other medicines, foods, dyes, or preservatives -pregnant or trying to get pregnant -breast-feeding How should I use this medicine? Take this medicine by mouth with a glass of water. Follow the directions on the prescription label. You can take it with or without food. If it upsets your stomach, take it with food.Take your medicine at regular intervals. Do not take it more often than directed. Do not stop taking except on your doctor's advice. If you are directed to break the 600 or 800 mg tablets in half  as part of your dose, the extra half tablet should be used for the next dose. If you have not used the extra half tablet within 28 days, it should be thrown away. A special MedGuide will be given to you by the pharmacist with each prescription and refill. Be sure to read this information carefully each time. Talk to your pediatrician regarding the use of this medicine in children. Special care may be needed. Overdosage: If you think you have taken too much of this medicine contact a poison control center or emergency room at once. NOTE: This medicine is only for you. Do not share this medicine with others. What if I miss a dose? If you miss a dose, take it as soon as you can. If it is almost time for your next dose, take only that dose. Do not take double or extra doses. What may interact with this medicine? Do not take this medicine with any of the following medications: -other gabapentin products This medicine may also interact with the following medications: -alcohol -antacids -antihistamines for allergy, cough and cold -certain medicines for anxiety or sleep -certain medicines for depression or psychotic disturbances -homatropine; hydrocodone -naproxen -narcotic medicines (opiates) for pain -phenothiazines like chlorpromazine, mesoridazine, prochlorperazine, thioridazine This list may not describe all possible interactions. Give your health care provider a list of all the medicines, herbs, non-prescription drugs, or dietary supplements you use. Also tell them if you smoke, drink alcohol, or use illegal drugs. Some items may  interact with your medicine. What should I watch for while using this medicine? Visit your doctor or health care professional for regular checks on your progress. You may want to keep a record at home of how you feel your condition is responding to treatment. You may want to share this information with your doctor or health care professional at each visit. You should  contact your doctor or health care professional if your seizures get worse or if you have any new types of seizures. Do not stop taking this medicine or any of your seizure medicines unless instructed by your doctor or health care professional. Stopping your medicine suddenly can increase your seizures or their severity. Wear a medical identification bracelet or chain if you are taking this medicine for seizures, and carry a card that lists all your medications. You may get drowsy, dizzy, or have blurred vision. Do not drive, use machinery, or do anything that needs mental alertness until you know how this medicine affects you. To reduce dizzy or fainting spells, do not sit or stand up quickly, especially if you are an older patient. Alcohol can increase drowsiness and dizziness. Avoid alcoholic drinks. Your mouth may get dry. Chewing sugarless gum or sucking hard candy, and drinking plenty of water will help. The use of this medicine may increase the chance of suicidal thoughts or actions. Pay special attention to how you are responding while on this medicine. Any worsening of mood, or thoughts of suicide or dying should be reported to your health care professional right away. Women who become pregnant while using this medicine may enroll in the [place] Antiepileptic Drug Pregnancy Registry by calling [phone]. This registry collects information about the safety of antiepileptic drug use during pregnancy. What side effects may I notice from receiving this medicine? Side effects that you should report to your doctor or health care professional as soon as possible: -allergic reactions like skin rash, itching or hives, swelling of the face, lips, or tongue -worsening of mood, thoughts or actions of suicide or dying Side effects that usually do not require medical attention (report to your doctor or health care professional if they continue or are bothersome): -constipation -difficulty walking  or controlling muscle movements -dizziness -nausea -slurred speech -tiredness -tremors -weight gain This list may not describe all possible side effects. Call your doctor for medical advice about side effects. You may report side effects to FDA at 1-800-FDA-1088. Where should I keep my medicine? Keep out of reach of children. This medicine may cause accidental overdose and death if it taken by other adults, children, or pets. Mix any unused medicine with a substance like cat litter or coffee grounds. Then throw the medicine away in a sealed container like a sealed bag or a coffee can with a lid. Do not use the medicine after the expiration date. Store at room temperature between 15 and 30 degrees C (59 and 86 degrees F). NOTE: This sheet is a summary. It may not cover all possible information. If you have questions about this medicine, talk to your doctor, pharmacist, or health care provider.    2016, Elsevier/Gold Standard. (2013-08-05 15:26:50)

## 2016-02-09 DIAGNOSIS — M792 Neuralgia and neuritis, unspecified: Secondary | ICD-10-CM | POA: Insufficient documentation

## 2016-02-12 ENCOUNTER — Telehealth: Payer: Self-pay | Admitting: Neurology

## 2016-02-12 MED ORDER — GABAPENTIN 300 MG PO CAPS: 300.0000 mg | ORAL_CAPSULE | Freq: Three times a day (TID) | ORAL | 2 refills | Status: DC

## 2016-02-12 NOTE — Telephone Encounter (Signed)
Called and spoke to Vernona RiegerLaura at pt pharmacy. Advised I sent rx electronically on 8/17 and again today. She stated she has not received either. I gave verbal over the phone for 300mg  1 tablet up to 3 times daily. Quantity 90, 2 refills. She verbalized understanding and will get rx ready for pt to pick up. I advised this is a new rx.

## 2016-02-12 NOTE — Telephone Encounter (Signed)
Pt called and says her pharmacy does not have rx for gabapentin (NEURONTIN) 300 MG capsule can a new one be sent.

## 2016-03-05 ENCOUNTER — Ambulatory Visit (INDEPENDENT_AMBULATORY_CARE_PROVIDER_SITE_OTHER): Payer: Self-pay | Admitting: Neurology

## 2016-03-05 ENCOUNTER — Ambulatory Visit (INDEPENDENT_AMBULATORY_CARE_PROVIDER_SITE_OTHER): Payer: Medicaid Other | Admitting: Neurology

## 2016-03-05 DIAGNOSIS — Z0289 Encounter for other administrative examinations: Secondary | ICD-10-CM

## 2016-03-05 DIAGNOSIS — G5603 Carpal tunnel syndrome, bilateral upper limbs: Secondary | ICD-10-CM

## 2016-03-05 NOTE — Procedures (Signed)
[facility]    Provider:  Dr Lucia GaskinsAhern Referring Provider: Gareth MorganKnowlton, Steve, MD Primary Care Physician:  Milana ObeyStephen D Knowlton, MD  CC:  headache  HPI:  [name] is a 60 y.o. female here for follow up of hand pain. Past medical history diabetes, high cholesterol, obesity., Aneurysm s/p clipping.  The left hand is painful, she ca';t make a fist, numbness and tingling in the left hand and now in the right hand as well. She reports left >> right hand weakness and dropping objects. No neck pain or radicular symptoms. She drops things from her hands. Left hand pain and weakness started due to trauma she fell on stairs. She is awoken by pain in the middle of the night and has to shake them out. She also has some diabetic neuropathy per patient. No focal neurologic deficits otherwise per patient. No fhx neuropathy. +Tinel's sign and Phalen's maneuver.  Summary:   Nerve Conduction Studies were performed on the bilateral upper extremities.  The right median APB motor nerve showed prolonged distal onset latency (9.5 ms, N<4.0). The right Median 2nd Digit sensory nerve showed prolonged distal peak latency (5.8 ms, N<3.9). F Wave studies indicate that the right Median F wave has delayed latency(34.8, N<5734ms).  The left median APB motor nerve showed prolonged distal onset latency (5.4 ms, N<4.0). The left Median 2nd Digit sensory nerve showed prolonged distal peak latency (4.7 ms, N<3.9).  F Wave studies indicate that the left Median F wave has normal latency.   Bilateral Ulnar ADM motor nerves were within normal limits. F Wave studies indicate that the bilateral Ulnar F waves have normal latencies The bilateralUlnar 5th digit sensory nerves were within normal limits. The right Radial sensory nerve was within normal limits.  EMG needle study of selected bilateral upper extremity muscles was performed. The following muscles were normal: Deltoid, Triceps,  Pronator Teres,  Opponens Pollicis, First Dorsal Interosseous and lower cervical paraspinals bilaterally.  Conclusion: This is an abnormal study. There is electrophysiologic evidence of bilateral moderately-severe Right > Left Carpal Tunnel Syndrome. No suggestion of polyneuropathy or radiculopathy. Clinical correlation recommended.   Tina DeanAntonia Odus Clasby, MD  [facility] [address] [place], [place] [address]  Phone [phone] Fax [phone]

## 2016-03-05 NOTE — Addendum Note (Signed)
Addended by: Naomie DeanAHERN, Abijah Roussel B on: 03/05/2016 10:48 PM   Modules accepted: Orders

## 2016-03-05 NOTE — Progress Notes (Signed)
See procedure note.

## 2016-03-05 NOTE — Progress Notes (Signed)
[facility]    Provider:  Dr Ahern Referring Provider: Knowlton, Steve, MD Primary Care Physician:  Stephen D Knowlton, MD  CC:  headache  HPI:  Margrett J Eick is a 60 y.o. female here for follow up of hand pain. Past medical history diabetes, high cholesterol, obesity., Aneurysm s/p clipping.  The left hand is painful, she ca';t make a fist, numbness and tingling in the left hand and now in the right hand as well. She reports left >> right hand weakness and dropping objects. No neck pain or radicular symptoms. She drops things from her hands. Left hand pain and weakness started due to trauma she fell on stairs. She is awoken by pain in the middle of the night and has to shake them out. She also has some diabetic neuropathy per patient. No focal neurologic deficits otherwise per patient. No fhx neuropathy. +Tinel's sign and Phalen's maneuver.  Summary:   Nerve Conduction Studies were performed on the bilateral upper extremities.  The right median APB motor nerve showed prolonged distal onset latency (9.5 ms, N<4.0). The right Median 2nd Digit sensory nerve showed prolonged distal peak latency (5.8 ms, N<3.9). F Wave studies indicate that the right Median F wave has delayed latency(34.8, N<34ms).  The left median APB motor nerve showed prolonged distal onset latency (5.4 ms, N<4.0). The left Median 2nd Digit sensory nerve showed prolonged distal peak latency (4.7 ms, N<3.9).  F Wave studies indicate that the left Median F wave has normal latency.   Bilateral Ulnar ADM motor nerves were within normal limits. F Wave studies indicate that the bilateral Ulnar F waves have normal latencies The bilateralUlnar 5th digit sensory nerves were within normal limits. The right Radial sensory nerve was within normal limits.  EMG needle study of selected bilateral upper extremity muscles was performed. The following muscles were normal: Deltoid, Triceps,  Pronator Teres,  Opponens Pollicis, First Dorsal Interosseous and lower cervical paraspinals bilaterally.  Conclusion: This is an abnormal study. There is electrophysiologic evidence of bilateral moderately-severe Right > Left Carpal Tunnel Syndrome. No suggestion of polyneuropathy or radiculopathy. Clinical correlation recommended.   Antonia Ahern, MD  [facility] [address] [place], [place] [address]  Phone [phone] Fax [phone]   [facility]    Provider:  Dr Lucia Gaskins Referring Provider: Gareth Morgan, MD Primary Care Physician:  Milana Obey, MD  CC:  headache  HPI:  JAIRY ANGULO is a 60 y.o. female here for follow up of hand pain. Past medical history diabetes, high cholesterol, obesity., Aneurysm s/p clipping.  The left hand is painful, she ca';t make a fist, numbness and tingling in the left hand and now in the right hand as well. She reports left >> right hand weakness and dropping objects. No neck pain or radicular symptoms. She drops things from her hands. Left hand pain and weakness started due to trauma she fell on stairs. She is awoken by pain in the middle of the night and has to shake them out. She also has some diabetic neuropathy per patient. No focal neurologic deficits otherwise per patient. No fhx neuropathy. +Tinel's sign and Phalen's maneuver.  Summary:   Nerve Conduction Studies were performed on the bilateral upper extremities.  The right median APB motor nerve showed prolonged distal onset latency (9.5 ms, N<4.0). The right Median 2nd Digit sensory nerve showed prolonged distal peak latency (5.8 ms, N<3.9). F Wave studies indicate that the right Median F wave has delayed latency(34.8, N<76ms).  The left median APB motor nerve showed prolonged distal onset latency (5.4 ms, N<4.0). The left Median 2nd Digit sensory nerve showed prolonged distal peak latency (4.7 ms, N<3.9).  F Wave studies indicate that the left Median F wave has normal latency.   Bilateral Ulnar ADM motor nerves were within normal limits. F Wave studies indicate that the bilateral Ulnar F waves have normal latencies The bilateralUlnar 5th digit sensory nerves were within normal limits. The right Radial sensory nerve was within normal limits.  EMG needle study of selected bilateral upper extremity muscles was performed. The following muscles were normal: Deltoid, Triceps,  Pronator Teres,  Opponens Pollicis, First Dorsal Interosseous and lower cervical paraspinals bilaterally.  Conclusion: This is an abnormal study. There is electrophysiologic evidence of bilateral moderately-severe Right > Left Carpal Tunnel Syndrome. No suggestion of polyneuropathy or radiculopathy. Clinical correlation recommended.   Naomie Dean, MD  [facility] [address] [place], [place] [address]  Phone [phone] Fax [phone]

## 2016-03-11 ENCOUNTER — Telehealth: Payer: Self-pay | Admitting: Neurology

## 2016-03-11 NOTE — Telephone Encounter (Signed)
Becky/Dr Sudie BaileyKnowlton [phone] or [phone] called said Dr Sudie BaileyKnowlton is checking wanting this pt seen by the [facility] or ( Dr Mina MarbleWeingold or Dr Merlyn LotKuzma) or Orthopaedic surgeon that specializes in CTS asap. Pls call Becky and let her know the date of the appt.

## 2016-03-11 NOTE — Telephone Encounter (Signed)
Called [name] back. She stated she spoke with [name] this am and everything got worked out. She verbalized understanding. Advised she can call back if she has any further questions. She was trying to call yesterday but could not get through on our phones. She stated it just kept ringing and she ended up sending a fax to us to have our office call her.

## 2016-03-19 ENCOUNTER — Encounter (HOSPITAL_BASED_OUTPATIENT_CLINIC_OR_DEPARTMENT_OTHER): Payer: Self-pay | Admitting: *Deleted

## 2016-03-19 ENCOUNTER — Other Ambulatory Visit: Payer: Self-pay | Admitting: Orthopedic Surgery

## 2016-03-19 ENCOUNTER — Encounter (HOSPITAL_COMMUNITY)
Admission: RE | Admit: 2016-03-19 | Discharge: 2016-03-19 | Disposition: A | Discharge status: Home / Self Care | Payer: Medicaid Other | Source: Ambulatory Visit | Attending: Orthopedic Surgery | Admitting: Orthopedic Surgery

## 2016-03-19 DIAGNOSIS — I10 Essential (primary) hypertension: Secondary | ICD-10-CM | POA: Diagnosis not present

## 2016-03-19 DIAGNOSIS — Z01818 Encounter for other preprocedural examination: Secondary | ICD-10-CM | POA: Insufficient documentation

## 2016-03-19 DIAGNOSIS — E119 Type 2 diabetes mellitus without complications: Secondary | ICD-10-CM | POA: Diagnosis not present

## 2016-03-19 LAB — BASIC METABOLIC PANEL
Anion gap: 5 (ref 5–15)
BUN: 18 mg/dL (ref 6–20)
CO2: 23 mmol/L (ref 22–32)
Calcium: 8.9 mg/dL (ref 8.9–10.3)
Chloride: 108 mmol/L (ref 101–111)
Creatinine, Ser: 0.87 mg/dL (ref 0.44–1.00)
GFR calc Af Amer: >60 mL/min (ref >60)
GFR calc non Af Amer: >60 mL/min (ref >60)
Glucose, Bld: 106 mg/dL — ABNORMAL HIGH (ref 65–99)
Potassium: 4 mmol/L (ref 3.5–5.1)
Sodium: 136 mmol/L (ref 135–145)

## 2016-03-20 NOTE — H&P (Signed)
[name] is an 60 y.o. female.   CC / Reason for Visit: Bilateral hand numbness and tingling HPI: Patient is a 60 year old, right-hand-dominant, female who indicates that she was seen by [facility] and had nerve conduction studies for ongoing numbness and tingling in bilateral upper extremities.  She reports that she is an insulin-dependent diabetic as well as has had a history of aneurysm surgery.  Patient reports that she is not on any type of anticoagulant at this time.  Patient further indicates that her primary care physician has prescribed her hydrocodone 10/325 for the pain that she's been having an to her hands.  She indicates that she takes 4-5 per day but not necessarily every day.  She is here seeking further evaluation and potentially surgical treatment.   Past Medical History:  Diagnosis Date  . 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 Arthritis    degenerative lumbar spine   . Depression   . Diabetes mellitus   . Essential hypertension, benign   . H/O cardiovascular stress test 2012   [facility]  . Headache    headache- R side of head  . Mixed hyperlipidemia   . Pneumonia 2014   [facility]- admitted for 4 days  . Type 2 diabetes mellitus ([place])     Past Surgical History:  Procedure Laterality Date  . Bilateral tubal ligation    . CRANIOTOMY Right 02/15/2015   Procedure: Right Frontotemporal Craniotomy for aneurysm clipping;  Surgeon: Consuella Lose, MD;  Location: [facility];  Service: Neurosurgery;  Laterality: Right;  right  . RADIOLOGY WITH ANESTHESIA N/A 09/07/2014   Procedure: Embolization Stent Supported Coil of Aneurysm;  Surgeon: Consuella Lose, MD;  Location: [facility];  Service: Radiology;  Laterality: N/A;  . UMBILICAL HERNIA REPAIR  2008    Family History  Problem Relation Age of Onset  . Cancer Father   . COPD Mother    Social History:  reports that she has been smoking Cigarettes.  She has a 40.00 pack-year smoking history. She has never used  smokeless tobacco. She reports that she does not drink alcohol or use drugs.  Allergies: No Known Allergies  No prescriptions prior to admission.    Results for orders placed or performed during the hospital encounter of 03/19/16 (from the past 48 hour(s))  Basic metabolic panel     Status: Abnormal   Collection Time: 03/19/16  3:28 PM  Result Value Ref Range   Sodium 136 135 - 145 mmol/L   Potassium 4.0 3.5 - 5.1 mmol/L   Chloride 108 101 - 111 mmol/L   CO2 23 22 - 32 mmol/L   Glucose, Bld 106 (H) 65 - 99 mg/dL   BUN 18 6 - 20 mg/dL   Creatinine, Ser 0.87 0.44 - 1.00 mg/dL   Calcium 8.9 8.9 - 10.3 mg/dL   GFR calc non Af Amer >60 >60 mL/min   GFR calc Af Amer >60 >60 mL/min    Comment: (NOTE) The eGFR has been calculated using the CKD EPI equation. This calculation has not been validated in all clinical situations. eGFR's persistently <60 mL/min signify possible Chronic Kidney Disease.    Anion gap 5 5 - 15   No results found.  Review of Systems  All other systems reviewed and are negative.   Height 5' 2" (1.575 m), weight 88.5 kg (195 lb). Physical Exam  Constitutional:  WD, WN, NAD HEENT:  NCAT, EOMI Neuro/Psych:  Alert & oriented to person, place, and  time; appropriate mood & affect Lymphatic: No generalized UE edema or lymphadenopathy Extremities / MSK:  Both UE are normal with respect to appearance, ranges of motion, joint stability, muscle strength/tone, sensation, & perfusion except as otherwise noted:  Bilateral upper extremities are normal in appearance.  There is altered sensibility with light touch bilaterally in the median nerve distribution.  Grip strength testing in position 2 right 30 left 15.  Monofilament testing on the right thumb 4.31 index long and ring 3.61 small finger 2.83; left 4.31 thumb index 2.83, long 2.83, ring 3.61, small 2.83.   Labs / Xrays:  No radiographic studies obtained today.  Nerve conduction studies done by [facility]  on 03/05/2016 indicate that she has bilateral moderately severe right and left carpal tunnel syndrome with the right being worse in the left.  Please see report for further information.  Assessment: Bilateral carpal tunnel syndrome  Plan:  The findings were discussed the patient.  She would like to move forward with endoscopic carpal tunnel release on the right.  She was told that she may have significant amounts of pain after surgery secondary to being on narcotics at such a high dose currently. The details of the operative procedure were discussed with the patient.  Questions were invited and answered.  In addition to the goal of the procedure, the risks of the procedure to include but not limited to bleeding; infection; damage to the nerves or blood vessels that could result in bleeding, numbness, weakness, chronic pain, and the need for additional procedures; stiffness; the need for revision surgery; and anesthetic risks were reviewed.  No specific outcome was guaranteed or implied.  Informed consent was obtained.   [name] A., MD 03/20/2016, 3:38 PM   

## 2016-03-23 DIAGNOSIS — G5602 Carpal tunnel syndrome, left upper limb: Secondary | ICD-10-CM

## 2016-03-23 HISTORY — DX: Carpal tunnel syndrome, left upper limb: G56.02

## 2016-03-25 ENCOUNTER — Ambulatory Visit (HOSPITAL_BASED_OUTPATIENT_CLINIC_OR_DEPARTMENT_OTHER): Payer: Medicaid Other | Admitting: Anesthesiology

## 2016-03-25 ENCOUNTER — Encounter (HOSPITAL_BASED_OUTPATIENT_CLINIC_OR_DEPARTMENT_OTHER)
Admission: RE | Disposition: A | Discharge status: Home / Self Care | Payer: Self-pay | Source: Ambulatory Visit | Attending: Orthopedic Surgery

## 2016-03-25 ENCOUNTER — Encounter (HOSPITAL_BASED_OUTPATIENT_CLINIC_OR_DEPARTMENT_OTHER): Payer: Self-pay

## 2016-03-25 ENCOUNTER — Ambulatory Visit (HOSPITAL_BASED_OUTPATIENT_CLINIC_OR_DEPARTMENT_OTHER)
Admission: RE | Admit: 2016-03-25 | Discharge: 2016-03-25 | Disposition: A | Discharge status: Home / Self Care | Payer: Medicaid Other | Source: Ambulatory Visit | Attending: Orthopedic Surgery | Admitting: Orthopedic Surgery

## 2016-03-25 DIAGNOSIS — M479 Spondylosis, unspecified: Secondary | ICD-10-CM | POA: Diagnosis not present

## 2016-03-25 DIAGNOSIS — I10 Essential (primary) hypertension: Secondary | ICD-10-CM | POA: Diagnosis not present

## 2016-03-25 DIAGNOSIS — Z794 Long term (current) use of insulin: Secondary | ICD-10-CM | POA: Insufficient documentation

## 2016-03-25 DIAGNOSIS — E1151 Type 2 diabetes mellitus with diabetic peripheral angiopathy without gangrene: Secondary | ICD-10-CM | POA: Insufficient documentation

## 2016-03-25 DIAGNOSIS — F17210 Nicotine dependence, cigarettes, uncomplicated: Secondary | ICD-10-CM | POA: Diagnosis not present

## 2016-03-25 DIAGNOSIS — G5603 Carpal tunnel syndrome, bilateral upper limbs: Secondary | ICD-10-CM | POA: Insufficient documentation

## 2016-03-25 DIAGNOSIS — R200 Anesthesia of skin: Secondary | ICD-10-CM | POA: Diagnosis present

## 2016-03-25 HISTORY — PX: CARPAL TUNNEL RELEASE: SHX101

## 2016-03-25 LAB — GLUCOSE, CAPILLARY
Glucose-Capillary: 169 mg/dL — ABNORMAL HIGH (ref 65–99)
Glucose-Capillary: 156 mg/dL — ABNORMAL HIGH (ref 65–99)

## 2016-03-25 SURGERY — RELEASE, CARPAL TUNNEL, ENDOSCOPIC
Anesthesia: Monitor Anesthesia Care | Site: Wrist | Laterality: Right

## 2016-03-25 MED ORDER — LIDOCAINE HCL 2 % IJ SOLN
INTRAMUSCULAR | Status: DC | PRN
  Administered 2016-03-25: 5 mL

## 2016-03-25 MED ORDER — CEFAZOLIN SODIUM-DEXTROSE 2-4 GM/100ML-% IV SOLN
2.0000 g | INTRAVENOUS | Status: AC
  Administered 2016-03-25: 2 g via INTRAVENOUS

## 2016-03-25 MED ORDER — HYDROMORPHONE HCL 1 MG/ML IJ SOLN: 0.2500 mg | INTRAMUSCULAR | Status: DC | PRN

## 2016-03-25 MED ORDER — LACTATED RINGERS IV SOLN
INTRAVENOUS | Status: DC
  Administered 2016-03-25 (×2): via INTRAVENOUS

## 2016-03-25 MED ORDER — OXYCODONE HCL 5 MG PO TABS: 5.0000 mg | ORAL_TABLET | Freq: Four times a day (QID) | ORAL | 0 refills | Status: DC | PRN

## 2016-03-25 MED ORDER — PROMETHAZINE HCL 25 MG/ML IJ SOLN: 6.2500 mg | INTRAMUSCULAR | Status: DC | PRN

## 2016-03-25 MED ORDER — MEPERIDINE HCL 25 MG/ML IJ SOLN: 6.2500 mg | INTRAMUSCULAR | Status: DC | PRN

## 2016-03-25 MED ORDER — LIDOCAINE 2% (20 MG/ML) 5 ML SYRINGE
INTRAMUSCULAR | Status: AC
  Filled 2016-03-25: qty 5

## 2016-03-25 MED ORDER — FENTANYL CITRATE (PF) 100 MCG/2ML IJ SOLN
50.0000 ug | INTRAMUSCULAR | Status: DC | PRN
  Administered 2016-03-25: 100 ug via INTRAVENOUS

## 2016-03-25 MED ORDER — PROPOFOL 10 MG/ML IV BOLUS
INTRAVENOUS | Status: DC | PRN
Start: 2016-03-25 — End: 2016-03-25
  Administered 2016-03-25 (×4): 20 mg via INTRAVENOUS

## 2016-03-25 MED ORDER — GLYCOPYRROLATE 0.2 MG/ML IJ SOLN: 0.2000 mg | Freq: Once | INTRAMUSCULAR | Status: DC | PRN

## 2016-03-25 MED ORDER — OXYCODONE HCL 5 MG PO TABS: 5.0000 mg | ORAL_TABLET | Freq: Once | ORAL | Status: DC | PRN

## 2016-03-25 MED ORDER — LACTATED RINGERS IV SOLN: INTRAVENOUS | Status: DC

## 2016-03-25 MED ORDER — SCOPOLAMINE 1 MG/3DAYS TD PT72
1.0000 | MEDICATED_PATCH | Freq: Once | TRANSDERMAL | Status: DC | PRN
Start: 2016-03-25 — End: 2016-03-25

## 2016-03-25 MED ORDER — ONDANSETRON HCL 4 MG/2ML IJ SOLN
INTRAMUSCULAR | Status: DC | PRN
  Administered 2016-03-25: 4 mg via INTRAVENOUS

## 2016-03-25 MED ORDER — OXYCODONE HCL 5 MG/5ML PO SOLN: 5.0000 mg | Freq: Once | ORAL | Status: DC | PRN

## 2016-03-25 MED ORDER — LIDOCAINE HCL 2 % IJ SOLN
INTRAMUSCULAR | Status: AC
  Filled 2016-03-25: qty 20

## 2016-03-25 MED ORDER — CEFAZOLIN SODIUM-DEXTROSE 2-4 GM/100ML-% IV SOLN
INTRAVENOUS | Status: AC
  Filled 2016-03-25: qty 100

## 2016-03-25 MED ORDER — MIDAZOLAM HCL 2 MG/2ML IJ SOLN
1.0000 mg | INTRAMUSCULAR | Status: DC | PRN
  Administered 2016-03-25: 2 mg via INTRAVENOUS

## 2016-03-25 MED ORDER — MIDAZOLAM HCL 2 MG/2ML IJ SOLN
INTRAMUSCULAR | Status: AC
  Filled 2016-03-25: qty 2

## 2016-03-25 MED ORDER — BUPIVACAINE-EPINEPHRINE (PF) 0.5% -1:200000 IJ SOLN
INTRAMUSCULAR | Status: AC
  Filled 2016-03-25: qty 30

## 2016-03-25 MED ORDER — BUPIVACAINE-EPINEPHRINE (PF) 0.5% -1:200000 IJ SOLN
INTRAMUSCULAR | Status: DC | PRN
  Administered 2016-03-25: 5 mL

## 2016-03-25 MED ORDER — FENTANYL CITRATE (PF) 100 MCG/2ML IJ SOLN
INTRAMUSCULAR | Status: AC
  Filled 2016-03-25: qty 2

## 2016-03-25 SURGICAL SUPPLY — 44 items
APPLICATOR COTTON TIP 6IN STRL (MISCELLANEOUS) ×3 IMPLANT
BLADE HOOK ENDO STRL (BLADE) ×3 IMPLANT
BLADE SURG 15 STRL LF DISP TIS (BLADE) ×1 IMPLANT
BLADE SURG 15 STRL SS (BLADE) ×2
BLADE TRIANGLE EPF/EGR ENDO (BLADE) ×3 IMPLANT
BNDG COHESIVE 4X5 TAN STRL (GAUZE/BANDAGES/DRESSINGS) ×3 IMPLANT
BNDG ESMARK 4X9 LF (GAUZE/BANDAGES/DRESSINGS) ×3 IMPLANT
BNDG GAUZE ELAST 4 BULKY (GAUZE/BANDAGES/DRESSINGS) ×3 IMPLANT
CHLORAPREP W/TINT 26ML (MISCELLANEOUS) ×3 IMPLANT
CORDS BIPOLAR (ELECTRODE) IMPLANT
COVER BACK TABLE 60X90IN (DRAPES) ×3 IMPLANT
COVER MAYO STAND STRL (DRAPES) ×3 IMPLANT
CUFF TOURNIQUET SINGLE 18IN (TOURNIQUET CUFF) ×3 IMPLANT
DRAIN TLS ROUND 10FR (DRAIN) ×3 IMPLANT
DRAPE EXTREMITY T 121X128X90 (DRAPE) ×3 IMPLANT
DRAPE SURG 17X23 STRL (DRAPES) ×3 IMPLANT
DRSG EMULSION OIL 3X3 NADH (GAUZE/BANDAGES/DRESSINGS) ×3 IMPLANT
GLOVE BIO SURGEON STRL SZ7.5 (GLOVE) ×6 IMPLANT
GLOVE BIOGEL PI IND STRL 7.0 (GLOVE) ×1 IMPLANT
GLOVE BIOGEL PI IND STRL 8 (GLOVE) ×1 IMPLANT
GLOVE BIOGEL PI INDICATOR 7.0 (GLOVE) ×2
GLOVE BIOGEL PI INDICATOR 8 (GLOVE) ×2
GLOVE ECLIPSE 6.5 STRL STRAW (GLOVE) ×3 IMPLANT
GLOVE SURG SS PI 6.5 STRL IVOR (GLOVE) ×3 IMPLANT
GOWN STRL REUS W/ TWL LRG LVL3 (GOWN DISPOSABLE) ×2 IMPLANT
GOWN STRL REUS W/ TWL XL LVL3 (GOWN DISPOSABLE) ×1 IMPLANT
GOWN STRL REUS W/TWL LRG LVL3 (GOWN DISPOSABLE) ×4
GOWN STRL REUS W/TWL XL LVL3 (GOWN DISPOSABLE) ×5 IMPLANT
NDL SAFETY ECLIPSE 18X1.5 (NEEDLE) ×1 IMPLANT
NEEDLE HYPO 18GX1.5 SHARP (NEEDLE) ×2
NEEDLE HYPO 25X1 1.5 SAFETY (NEEDLE) ×3 IMPLANT
NS IRRIG 1000ML POUR BTL (IV SOLUTION) ×3 IMPLANT
PACK BASIN DAY SURGERY FS (CUSTOM PROCEDURE TRAY) ×3 IMPLANT
PADDING CAST ABS 4INX4YD NS (CAST SUPPLIES)
PADDING CAST ABS COTTON 4X4 ST (CAST SUPPLIES) IMPLANT
SPONGE GAUZE 4X4 12PLY STER LF (GAUZE/BANDAGES/DRESSINGS) ×3 IMPLANT
STOCKINETTE 6  STRL (DRAPES) ×2
STOCKINETTE 6 STRL (DRAPES) ×1 IMPLANT
SUT VICRYL RAPIDE 4-0 (SUTURE) ×3 IMPLANT
SYR BULB 3OZ (MISCELLANEOUS) ×3 IMPLANT
SYRINGE 10CC LL (SYRINGE) ×3 IMPLANT
TOWEL OR 17X24 6PK STRL BLUE (TOWEL DISPOSABLE) ×6 IMPLANT
TOWEL OR NON WOVEN STRL DISP B (DISPOSABLE) ×3 IMPLANT
UNDERPAD 30X30 (UNDERPADS AND DIAPERS) ×3 IMPLANT

## 2016-03-25 NOTE — Discharge Instructions (Signed)
Discharge Instructions   You have a light dressing on your hand.  You may begin gentle motion of your fingers and hand immediately, but you should not do any heavy lifting or gripping.  Elevate your hand to reduce pain & swelling of the digits.  Ice over the operative site may be helpful to reduce pain & swelling. You can also place ice in your arm pit.  DO NOT USE HEAT. Pain medicine has been prescribed for you.  Use your medicine for break through pain only over the first 48 hours, and then you can begin to taper your use. Take 800mg  of ibuprofen over the counter every 6 hours. Tylenol can also be taken for pain. Leave the dressing in place until the third day after your surgery and then remove it, leaving it open to air.  After the bandage has been removed you may shower, but do not soak the incision.  You may drive a car when you are off of prescription pain medications and can safely control your vehicle with both hands. We will address whether therapy will be required or not when you return to the office. You may have already made your follow-up appointment when we completed your preop visit.  If not, please call our office today or the next business day to make your return appointment for 10-15 days after surgery.   Please call [phone] during normal business hours or [phone] after hours for any problems. Including the following:  - excessive redness of the incisions - drainage for more than 4 days - fever of more than 101.5 F  *Please note that pain medications will not be refilled after hours or on weekends.   TLS Drain Instructions You have a drain tube in place to help limit the amount of blood that collects under your skin in the early post-operative period.  The amount it drains will vary from person-to-person and is dependent upon many factors.  You will have 1-2 extra drain tubes sent with you from the facility.  You should change the tube according to these  instructions below when the tube is about half full.  BE SURE TO SLIDE THE CLAMP TO THE CLOSED POSITION BEFORE CHANGING THE TUBE.    RE-OPEN THE CLAMP ONCE THE GLASS EVACUATION TUBE HAS BEEN CHANGED.  24 hours after surgery the amount of drainage will likely be negligible and it will be time to remove the tube and discard it.  Simply remove the tape that secures the flexible plastic drainage tube to the bandage and briskly pull the tube straight out.  You will likely feel a little discomfort during this process, but the tube should slide out as it is not sewn or otherwise secured directly to your body.  It is merely held in place by the bandage itself.                              If you are not clear about when your first post-op appointment is scheduled, please call the office on the next business day to inquire about it.  Please also call the office if you have any other questions [phone]).       Post Anesthesia Home Care Instructions  Activity: Get plenty of rest for the remainder of the day. A responsible adult should stay with you for 24 hours following the procedure.  For the next 24 hours, DO NOT: -Drive a car Environmental consultant-Operate machinery -Drink  alcoholic beverages -Take any medication unless instructed by your physician -Make any legal decisions or sign important papers.  Meals: Start with liquid foods such as gelatin or soup. Progress to regular foods as tolerated. Avoid greasy, spicy, heavy foods. If nausea and/or vomiting occur, drink only clear liquids until the nausea and/or vomiting subsides. Call your physician if vomiting continues.  Special Instructions/Symptoms: Your throat may feel dry or sore from the anesthesia or the breathing tube placed in your throat during surgery. If this causes discomfort, gargle with warm salt water. The discomfort should disappear within 24 hours.  If you had a scopolamine patch placed behind your ear for the management of post- operative  nausea and/or vomiting:  1. The medication in the patch is effective for 72 hours, after which it should be removed.  Wrap patch in a tissue and discard in the trash. Wash hands thoroughly with soap and water. 2. You may remove the patch earlier than 72 hours if you experience unpleasant side effects which may include dry mouth, dizziness or visual disturbances. 3. Avoid touching the patch. Wash your hands with soap and water after contact with the patch.

## 2016-03-25 NOTE — Interval H&P Note (Signed)
History and Physical Interval Note:  03/25/2016 12:45 PM  [name]  has presented today for surgery, with the diagnosis of RIGHT CARPAL TUNNEL SYNDROME G56.01  The various methods of treatment have been discussed with the patient and family. After consideration of risks, benefits and other options for treatment, the patient has consented to  Procedure(s): RIGHT ENDOSCOPIC CARPAL TUNNEL RELEASE (Right) as a surgical intervention .  The patient's history has been reviewed, patient examined, no change in status, stable for surgery.  I have reviewed the patient's chart and labs.  Questions were answered to the patient's satisfaction.     Mariaguadalupe Fialkowski A.

## 2016-03-25 NOTE — Op Note (Signed)
03/25/2016  12:47 PM  PATIENT:  [name]  60 y.o. female  PRE-OPERATIVE DIAGNOSIS:  RIGHT CARPAL TUNNEL SYNDROME G56.01  POST-OPERATIVE DIAGNOSIS:  Same  PROCEDURE:  Procedure(s): RIGHT ENDOSCOPIC CARPAL TUNNEL RELEASE  SURGEON:  Surgeon(s): Mack Hookavid Kevia Zaucha, MD  PHYSICIAN ASSISTANT: Danielle RankinKirsten Schrader, OPA-C  ANESTHESIA:  Local / MAC  SPECIMENS:  None  DRAINS:   TLS x 1  EBL:  less than 50 mL  PREOPERATIVE INDICATIONS:  [name] is a  60 y.o. female with a diagnosis of RIGHT CARPAL TUNNEL SYNDROME G56.01 who failed conservative measures and elected for surgical management.    The risks benefits and alternatives were discussed with the patient preoperatively including but not limited to the risks of infection, bleeding, nerve injury, cardiopulmonary complications, the need for revision surgery, among others, and the patient verbalized understanding and consented to proceed.  OPERATIVE IMPLANTS: None  OPERATIVE FINDINGS: Tight carpal tunnel, acceptably decompressed following release  OPERATIVE PROCEDURE:  After receiving prophylactic antibiotics, the patient was escorted to the operative theatre and placed in a supine position.   A surgical "time-out" was performed during which the planned procedure, proposed operative site, and the correct patient identity were compared to the operative consent and agreement confirmed by the circulating nurse according to current facility policy.  Following application of a tourniquet to the operative extremity, the planned incisions were marked and anesthetized with a mixture of lidocaine & bupivicaine containing epinephrine.  The exposed skin was prepped with Chloraprep and draped in the usual sterile fashion.  The limb was exsanguinated with an Esmarch bandage and the tourniquet inflated to approximately 100mmHg higher than systolic BP.   The incisions were made sharply. Subcutaneous tissues were dissected with blunt and spreading  dissection. At the proximal incision, the deep forearm fascia was split in line with the skin incision the distal edge grasped with a hemostat. At the mid palmar incision, the palmar fascia was split in line with the skin incision, revealing the underlying superficial palmar arch which was visualized with loupe assisted magnification. The synovial reflector was then introduced into the proximal incision and passed through the carpal canal, uses to reflect synovium from the deep surface of the transverse carpal ligament. It was removed and replaced with the slotted cannula and blunt obturator, passing from proximal to distal and exiting the distal wound superficial to the superficial palmar arch. The obturator was removed and the camera inserted. Visualization of the ligament was acceptable. The triangle shape blade was then inserted distally, advanced to the midportion of the ligament, and there used to create a perforation in the ligament. This instrument was removed and the hooked nstrument was inserted. It was placed into the perforation in the ligament and withdrawn distally, completing transection of the distal half of the ligament. The camera was then removed and placed into the distal end of the cannula. The hooked instrument was placed into the proximal end and advanced facility to be placed into the apex of the V. which had been formed to the distal hemi-transection of the ligament. It was withdrawn proximally, completing transection of the ligament. The adequacy of the release was judged with the scope and the instruments used as a probe. All of the endoscopic instruments are removed and the adequacy of release was again judged from the proximal incision perspective with direct loupe assisted visualization. In addition, the proximal forearm fascia was split for 2 inches proximal to the proximal incision under direct visualization using a sliding scissor  technique. The tourniquet was released, the wound  copiously irrigated. A TLS drain was placed to lie alongside the nerve exiting its own skin hole distally made with some sharp trocar. The skin was then closed with 4-0 Vicryl Rapide interrupted sutures. A light dressing was applied and the balance of 10 mL of the initial anesthetic mixture was placed down the drain, and the drain was clamped to be unclamped in the recovery room.  DISPOSITION:  The patient will be discharged home today, returning in 10-15 days for re-assessment.

## 2016-03-25 NOTE — Transfer of Care (Signed)
Immediate Anesthesia Transfer of Care Note  Patient: [name]  Procedure(s) Performed: Procedure(s): RIGHT ENDOSCOPIC CARPAL TUNNEL RELEASE (Right)  Patient Location: PACU  Anesthesia Type:MAC  Level of Consciousness: awake, alert  and oriented  Airway & Oxygen Therapy: Patient Spontanous Breathing and Patient connected to face mask oxygen  Post-op Assessment: Report given to RN and Post -op Vital signs reviewed and stable  Post vital signs: Reviewed and stable  Last Vitals:  Vitals:   03/25/16 1017  BP: 113/82  Pulse: 79  Resp: 20  Temp: 36.9 C    Last Pain:  Vitals:   03/25/16 1017  TempSrc: Oral  PainSc: 7       Patients Stated Pain Goal: 2 (03/25/16 1017)  Complications: No apparent anesthesia complications

## 2016-03-25 NOTE — Anesthesia Preprocedure Evaluation (Signed)
Anesthesia Evaluation  Patient identified by MRN, date of birth, ID band Patient awake    Reviewed: Allergy & Precautions, NPO status , Patient's Chart, lab work & pertinent test results  Airway Mallampati: II  TM Distance: >3 FB Neck ROM: Full    Dental   Pulmonary pneumonia, Current Smoker,    breath sounds clear to auscultation       Cardiovascular hypertension, + Peripheral Vascular Disease   Rhythm:Regular Rate:Normal     Neuro/Psych  Headaches, PSYCHIATRIC DISORDERS Depression    GI/Hepatic negative GI ROS, Neg liver ROS,   Endo/Other  diabetes  Renal/GU      Musculoskeletal  (+) Arthritis ,   Abdominal   Peds  Hematology   Anesthesia Other Findings   Reproductive/Obstetrics                             Anesthesia Physical  Anesthesia Plan  ASA: III  Anesthesia Plan: MAC   Post-op Pain Management:    Induction: Intravenous  Airway Management Planned: Simple Face Mask  Additional Equipment:   Intra-op Plan:   Post-operative Plan:   Informed Consent: I have reviewed the patients History and Physical, chart, labs and discussed the procedure including the risks, benefits and alternatives for the proposed anesthesia with the patient or authorized representative who has indicated his/her understanding and acceptance.   Dental advisory given  Plan Discussed with: CRNA, Anesthesiologist and Surgeon  Anesthesia Plan Comments:        Anesthesia Quick Evaluation

## 2016-03-25 NOTE — Anesthesia Postprocedure Evaluation (Signed)
Anesthesia Post Note  Patient: [name]  Procedure(s) Performed: Procedure(s) (LRB): RIGHT ENDOSCOPIC CARPAL TUNNEL RELEASE (Right)  Patient location during evaluation: PACU Anesthesia Type: MAC Level of consciousness: awake and alert Pain management: pain level controlled Vital Signs Assessment: post-procedure vital signs reviewed and stable Respiratory status: spontaneous breathing, nonlabored ventilation, respiratory function stable and patient connected to nasal cannula oxygen Cardiovascular status: stable and blood pressure returned to baseline Anesthetic complications: no    Last Vitals:  Vitals:   03/25/16 1415 03/25/16 1430  BP: 124/71 114/73  Pulse: 68 (!) 59  Resp: 16 13  Temp:      Last Pain:  Vitals:   03/25/16 1430  TempSrc:   PainSc: 0-No pain                 Bonita Quinichard S Doranne Schmutz

## 2016-03-26 ENCOUNTER — Encounter (HOSPITAL_BASED_OUTPATIENT_CLINIC_OR_DEPARTMENT_OTHER): Payer: Self-pay | Admitting: Orthopedic Surgery

## 2016-03-27 ENCOUNTER — Encounter: Payer: Medicaid Other | Admitting: Neurology

## 2016-04-07 ENCOUNTER — Other Ambulatory Visit: Payer: Self-pay | Admitting: Orthopedic Surgery

## 2016-04-07 ENCOUNTER — Encounter (HOSPITAL_BASED_OUTPATIENT_CLINIC_OR_DEPARTMENT_OTHER): Payer: Self-pay | Admitting: *Deleted

## 2016-04-08 ENCOUNTER — Encounter (HOSPITAL_BASED_OUTPATIENT_CLINIC_OR_DEPARTMENT_OTHER)
Admission: RE | Disposition: A | Discharge status: Home / Self Care | Payer: Self-pay | Source: Ambulatory Visit | Attending: Orthopedic Surgery

## 2016-04-08 ENCOUNTER — Ambulatory Visit (HOSPITAL_BASED_OUTPATIENT_CLINIC_OR_DEPARTMENT_OTHER)
Admission: RE | Admit: 2016-04-08 | Discharge: 2016-04-08 | Disposition: A | Discharge status: Home / Self Care | Payer: Medicaid Other | Source: Ambulatory Visit | Attending: Orthopedic Surgery | Admitting: Orthopedic Surgery

## 2016-04-08 ENCOUNTER — Ambulatory Visit (HOSPITAL_BASED_OUTPATIENT_CLINIC_OR_DEPARTMENT_OTHER): Payer: Medicaid Other | Admitting: Certified Registered"

## 2016-04-08 ENCOUNTER — Encounter (HOSPITAL_BASED_OUTPATIENT_CLINIC_OR_DEPARTMENT_OTHER): Payer: Self-pay | Admitting: Certified Registered"

## 2016-04-08 DIAGNOSIS — F17210 Nicotine dependence, cigarettes, uncomplicated: Secondary | ICD-10-CM | POA: Diagnosis not present

## 2016-04-08 DIAGNOSIS — G5602 Carpal tunnel syndrome, left upper limb: Secondary | ICD-10-CM | POA: Diagnosis not present

## 2016-04-08 DIAGNOSIS — M4696 Unspecified inflammatory spondylopathy, lumbar region: Secondary | ICD-10-CM | POA: Insufficient documentation

## 2016-04-08 DIAGNOSIS — E1151 Type 2 diabetes mellitus with diabetic peripheral angiopathy without gangrene: Secondary | ICD-10-CM | POA: Insufficient documentation

## 2016-04-08 DIAGNOSIS — Z794 Long term (current) use of insulin: Secondary | ICD-10-CM | POA: Insufficient documentation

## 2016-04-08 DIAGNOSIS — Z6835 Body mass index (BMI) 35.0-35.9, adult: Secondary | ICD-10-CM | POA: Diagnosis not present

## 2016-04-08 DIAGNOSIS — I10 Essential (primary) hypertension: Secondary | ICD-10-CM | POA: Insufficient documentation

## 2016-04-08 HISTORY — DX: Reserved for inherently not codable concepts without codable children: IMO0001

## 2016-04-08 HISTORY — DX: Essential (primary) hypertension: I10

## 2016-04-08 HISTORY — DX: Carpal tunnel syndrome, left upper limb: G56.02

## 2016-04-08 HISTORY — DX: Long term (current) use of insulin: Z79.4

## 2016-04-08 HISTORY — DX: Pure hypercholesterolemia, unspecified: E78.00

## 2016-04-08 HISTORY — DX: Other specified postprocedural states: Z98.890

## 2016-04-08 HISTORY — DX: Personal history of other diseases of the circulatory system: Z86.79

## 2016-04-08 HISTORY — PX: CARPAL TUNNEL RELEASE: SHX101

## 2016-04-08 HISTORY — DX: Type 2 diabetes mellitus without complications: E11.9

## 2016-04-08 LAB — GLUCOSE, CAPILLARY
Glucose-Capillary: 131 mg/dL — ABNORMAL HIGH (ref 65–99)
Glucose-Capillary: 125 mg/dL — ABNORMAL HIGH (ref 65–99)

## 2016-04-08 SURGERY — RELEASE, CARPAL TUNNEL, ENDOSCOPIC
Anesthesia: Monitor Anesthesia Care | Laterality: Left

## 2016-04-08 MED ORDER — LACTATED RINGERS IV SOLN
INTRAVENOUS | Status: DC
  Administered 2016-04-08: 13:00:00 via INTRAVENOUS

## 2016-04-08 MED ORDER — ONDANSETRON HCL 4 MG/2ML IJ SOLN
INTRAMUSCULAR | Status: AC
  Filled 2016-04-08: qty 2

## 2016-04-08 MED ORDER — FENTANYL CITRATE (PF) 100 MCG/2ML IJ SOLN
INTRAMUSCULAR | Status: AC
  Filled 2016-04-08: qty 2

## 2016-04-08 MED ORDER — LIDOCAINE 2% (20 MG/ML) 5 ML SYRINGE
INTRAMUSCULAR | Status: AC
  Filled 2016-04-08: qty 5

## 2016-04-08 MED ORDER — ONDANSETRON HCL 4 MG/2ML IJ SOLN
INTRAMUSCULAR | Status: DC | PRN
  Administered 2016-04-08: 4 mg via INTRAVENOUS

## 2016-04-08 MED ORDER — OXYCODONE HCL 5 MG PO TABS: 5.0000 mg | ORAL_TABLET | Freq: Four times a day (QID) | ORAL | 0 refills | Status: DC | PRN

## 2016-04-08 MED ORDER — LIDOCAINE HCL (CARDIAC) 20 MG/ML IV SOLN
INTRAVENOUS | Status: DC | PRN
  Administered 2016-04-08: 20 mg via INTRAVENOUS

## 2016-04-08 MED ORDER — LIDOCAINE HCL 2 % IJ SOLN
INTRAMUSCULAR | Status: DC | PRN
  Administered 2016-04-08: 5 mL

## 2016-04-08 MED ORDER — CEFAZOLIN SODIUM-DEXTROSE 2-4 GM/100ML-% IV SOLN
2.0000 g | INTRAVENOUS | Status: AC
  Administered 2016-04-08: 2 g via INTRAVENOUS

## 2016-04-08 MED ORDER — LACTATED RINGERS IV SOLN: INTRAVENOUS | Status: DC

## 2016-04-08 MED ORDER — LIDOCAINE HCL 2 % IJ SOLN
INTRAMUSCULAR | Status: AC
  Filled 2016-04-08: qty 20

## 2016-04-08 MED ORDER — GLYCOPYRROLATE 0.2 MG/ML IJ SOLN: 0.2000 mg | Freq: Once | INTRAMUSCULAR | Status: DC | PRN

## 2016-04-08 MED ORDER — PROPOFOL 500 MG/50ML IV EMUL: INTRAVENOUS | Status: DC | PRN

## 2016-04-08 MED ORDER — FENTANYL CITRATE (PF) 100 MCG/2ML IJ SOLN
25.0000 ug | INTRAMUSCULAR | Status: DC | PRN
  Administered 2016-04-08 (×2): 50 ug via INTRAVENOUS

## 2016-04-08 MED ORDER — MIDAZOLAM HCL 2 MG/2ML IJ SOLN
1.0000 mg | INTRAMUSCULAR | Status: DC | PRN
  Administered 2016-04-08: 2 mg via INTRAVENOUS

## 2016-04-08 MED ORDER — PROPOFOL 10 MG/ML IV BOLUS
INTRAVENOUS | Status: DC | PRN
  Administered 2016-04-08 (×3): 30 mg via INTRAVENOUS

## 2016-04-08 MED ORDER — BUPIVACAINE-EPINEPHRINE (PF) 0.25% -1:200000 IJ SOLN
INTRAMUSCULAR | Status: DC | PRN
  Administered 2016-04-08: 5 mL

## 2016-04-08 MED ORDER — FENTANYL CITRATE (PF) 100 MCG/2ML IJ SOLN
50.0000 ug | INTRAMUSCULAR | Status: DC | PRN
  Administered 2016-04-08: 100 ug via INTRAVENOUS

## 2016-04-08 MED ORDER — MIDAZOLAM HCL 2 MG/2ML IJ SOLN
INTRAMUSCULAR | Status: AC
  Filled 2016-04-08: qty 2

## 2016-04-08 MED ORDER — SCOPOLAMINE 1 MG/3DAYS TD PT72: 1.0000 | MEDICATED_PATCH | Freq: Once | TRANSDERMAL | Status: DC | PRN

## 2016-04-08 MED ORDER — MEPERIDINE HCL 25 MG/ML IJ SOLN
6.2500 mg | INTRAMUSCULAR | Status: DC | PRN
Start: 2016-04-08 — End: 2016-04-08

## 2016-04-08 SURGICAL SUPPLY — 42 items
APPLICATOR COTTON TIP 6IN STRL (MISCELLANEOUS) ×3 IMPLANT
BLADE HOOK ENDO STRL (BLADE) ×3 IMPLANT
BLADE SURG 15 STRL LF DISP TIS (BLADE) ×1 IMPLANT
BLADE SURG 15 STRL SS (BLADE) ×2
BLADE TRIANGLE EPF/EGR ENDO (BLADE) ×3 IMPLANT
BNDG COHESIVE 4X5 TAN STRL (GAUZE/BANDAGES/DRESSINGS) ×3 IMPLANT
BNDG ESMARK 4X9 LF (GAUZE/BANDAGES/DRESSINGS) ×3 IMPLANT
BNDG GAUZE ELAST 4 BULKY (GAUZE/BANDAGES/DRESSINGS) ×3 IMPLANT
CHLORAPREP W/TINT 26ML (MISCELLANEOUS) ×3 IMPLANT
CORDS BIPOLAR (ELECTRODE) IMPLANT
COVER BACK TABLE 60X90IN (DRAPES) ×3 IMPLANT
COVER MAYO STAND STRL (DRAPES) ×3 IMPLANT
CUFF TOURNIQUET SINGLE 18IN (TOURNIQUET CUFF) ×3 IMPLANT
DRAIN TLS ROUND 10FR (DRAIN) ×3 IMPLANT
DRAPE EXTREMITY T 121X128X90 (DRAPE) ×3 IMPLANT
DRAPE SURG 17X23 STRL (DRAPES) ×3 IMPLANT
DRSG EMULSION OIL 3X3 NADH (GAUZE/BANDAGES/DRESSINGS) ×3 IMPLANT
GLOVE BIO SURGEON STRL SZ 6.5 (GLOVE) ×2 IMPLANT
GLOVE BIO SURGEON STRL SZ7.5 (GLOVE) ×3 IMPLANT
GLOVE BIO SURGEONS STRL SZ 6.5 (GLOVE) ×1
GLOVE BIOGEL PI IND STRL 7.0 (GLOVE) ×2 IMPLANT
GLOVE BIOGEL PI IND STRL 8 (GLOVE) ×1 IMPLANT
GLOVE BIOGEL PI INDICATOR 7.0 (GLOVE) ×4
GLOVE BIOGEL PI INDICATOR 8 (GLOVE) ×2
GLOVE ECLIPSE 6.5 STRL STRAW (GLOVE) ×3 IMPLANT
GOWN STRL REUS W/ TWL LRG LVL3 (GOWN DISPOSABLE) ×2 IMPLANT
GOWN STRL REUS W/TWL LRG LVL3 (GOWN DISPOSABLE) ×4
GOWN STRL REUS W/TWL XL LVL3 (GOWN DISPOSABLE) ×3 IMPLANT
NEEDLE HYPO 25X1 1.5 SAFETY (NEEDLE) ×3 IMPLANT
NS IRRIG 1000ML POUR BTL (IV SOLUTION) ×3 IMPLANT
PACK BASIN DAY SURGERY FS (CUSTOM PROCEDURE TRAY) ×3 IMPLANT
PADDING CAST ABS 4INX4YD NS (CAST SUPPLIES) ×2
PADDING CAST ABS COTTON 4X4 ST (CAST SUPPLIES) ×1 IMPLANT
SPONGE GAUZE 4X4 12PLY STER LF (GAUZE/BANDAGES/DRESSINGS) ×3 IMPLANT
STOCKINETTE 6  STRL (DRAPES) ×2
STOCKINETTE 6 STRL (DRAPES) ×1 IMPLANT
SUT VICRYL RAPIDE 4-0 (SUTURE) ×3 IMPLANT
SYR BULB 3OZ (MISCELLANEOUS) ×3 IMPLANT
SYRINGE 10CC LL (SYRINGE) ×3 IMPLANT
TOWEL OR 17X24 6PK STRL BLUE (TOWEL DISPOSABLE) ×6 IMPLANT
TOWEL OR NON WOVEN STRL DISP B (DISPOSABLE) ×3 IMPLANT
UNDERPAD 30X30 (UNDERPADS AND DIAPERS) ×3 IMPLANT

## 2016-04-08 NOTE — Op Note (Signed)
04/08/2016  1:02 PM  PATIENT:  [name]  60 y.o. 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 female with a diagnosis of LEFT CARPAL TUNNEL SYNDROME G56.02 who failed conservative measures and elected for surgical management.    The risks benefits and alternatives were discussed with the patient preoperatively including but not limited to the risks of infection, bleeding, nerve injury, cardiopulmonary complications, the need for revision surgery, among others, and the patient verbalized understanding and consented to proceed.  OPERATIVE IMPLANTS: None  OPERATIVE FINDINGS: Tight carpal tunnel, acceptably decompressed following release  OPERATIVE PROCEDURE:  After receiving prophylactic antibiotics, the patient was escorted to the operative theatre and placed in a supine position.  A surgical "time-out" was performed during which the planned procedure, proposed operative site, and the correct patient identity were compared to the operative consent and agreement confirmed by the circulating nurse according to current facility policy.  Following application of a tourniquet to the operative extremity, the planned incisions were marked and anesthetized with a mixture of lidocaine & bupivicaine containing epinephrine.  The exposed skin was prepped with Chloraprep and draped in the usual sterile fashion.  The limb was exsanguinated with an Esmarch bandage and the tourniquet inflated to approximately higher than systolic BP.   The incisions were made sharply. Subcutaneous tissues were dissected with blunt and spreading  dissection. At the proximal incision, the deep forearm fascia was split in line with the skin incision the distal edge grasped with a hemostat. At the mid palmar incision, the palmar fascia was split in line with the skin incision, revealing the underlying superficial palmar arch which was visualized with loupe assisted magnification. The synovial reflector was then introduced into the proximal incision and passed through the carpal canal, uses to reflect synovium from the deep surface of the transverse carpal ligament. It was removed and replaced with the slotted cannula and blunt obturator, passing from proximal to distal and exiting the distal wound superficial to the superficial palmar arch. The obturator was removed and the camera inserted. Visualization of the ligament was acceptable. The triangle shape blade was then inserted distally, advanced to the midportion of the ligament, and there used to create a perforation in the ligament. This instrument was removed and the hooked nstrument was inserted. It was placed into the perforation in the ligament and withdrawn distally, completing transection of the distal half of the ligament. The camera was then removed and placed into the distal end of the cannula. The hooked instrument was placed into the proximal end and advanced facility to be placed into the apex of the V. which had been formed to the distal hemi-transection of the ligament. It was withdrawn proximally, completing transection of the ligament. The adequacy of the release was judged with the scope and the instruments used as a probe. All of the endoscopic instruments are removed and the adequacy of release was again judged from the proximal incision perspective with direct loupe assisted visualization. In addition, the proximal forearm fascia was split for 2 inches proximal to the proximal incision under direct visualization using a sliding scissor technique.  The tourniquet was released, the wound  copiously irrigated. A TLS drain was placed to lie alongside the nerve exiting its own skin hole distally made with some sharp trocar. The skin was then closed with 4-0 Vicryl Rapide interrupted sutures. A light dressing was applied and the balance of 10 mL of the initial anesthetic mixture was placed down the drain, and the drain was clamped to be unclamped in the recovery room.  DISPOSITION:  The patient will be discharged home today, returning in 10-15 days for re-assessment.

## 2016-04-08 NOTE — H&P (View-Only) (Signed)
[name] is an 60 y.o. female.   CC / Reason for Visit: Bilateral hand numbness and tingling HPI: Patient is a 60 year old, right-hand-dominant, female who indicates that she was seen by [facility] and had nerve conduction studies for ongoing numbness and tingling in bilateral upper extremities.  She reports that she is an insulin-dependent diabetic as well as has had a history of aneurysm surgery.  Patient reports that she is not on any type of anticoagulant at this time.  Patient further indicates that her primary care physician has prescribed her hydrocodone 10/325 for the pain that she's been having an to her hands.  She indicates that she takes 4-5 per day but not necessarily every day.  She is here seeking further evaluation and potentially surgical treatment.   Past Medical History:  Diagnosis Date  . 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 Arthritis    degenerative lumbar spine   . Depression   . Diabetes mellitus   . Essential hypertension, benign   . H/O cardiovascular stress test 2012   [facility]  . Headache    headache- R side of head  . Mixed hyperlipidemia   . Pneumonia 2014   [facility]- admitted for 4 days  . Type 2 diabetes mellitus ([place])     Past Surgical History:  Procedure Laterality Date  . Bilateral tubal ligation    . CRANIOTOMY Right 02/15/2015   Procedure: Right Frontotemporal Craniotomy for aneurysm clipping;  Surgeon: Consuella Lose, MD;  Location: [facility];  Service: Neurosurgery;  Laterality: Right;  right  . RADIOLOGY WITH ANESTHESIA N/A 09/07/2014   Procedure: Embolization Stent Supported Coil of Aneurysm;  Surgeon: Consuella Lose, MD;  Location: [facility];  Service: Radiology;  Laterality: N/A;  . UMBILICAL HERNIA REPAIR  2008    Family History  Problem Relation Age of Onset  . Cancer Father   . COPD Mother    Social History:  reports that she has been smoking Cigarettes.  She has a 40.00 pack-year smoking history. She has never used  smokeless tobacco. She reports that she does not drink alcohol or use drugs.  Allergies: No Known Allergies  No prescriptions prior to admission.    Results for orders placed or performed during the hospital encounter of 03/19/16 (from the past 48 hour(s))  Basic metabolic panel     Status: Abnormal   Collection Time: 03/19/16  3:28 PM  Result Value Ref Range   Sodium 136 135 - 145 mmol/L   Potassium 4.0 3.5 - 5.1 mmol/L   Chloride 108 101 - 111 mmol/L   CO2 23 22 - 32 mmol/L   Glucose, Bld 106 (H) 65 - 99 mg/dL   BUN 18 6 - 20 mg/dL   Creatinine, Ser 0.87 0.44 - 1.00 mg/dL   Calcium 8.9 8.9 - 10.3 mg/dL   GFR calc non Af Amer >60 >60 mL/min   GFR calc Af Amer >60 >60 mL/min    Comment: (NOTE) The eGFR has been calculated using the CKD EPI equation. This calculation has not been validated in all clinical situations. eGFR's persistently <60 mL/min signify possible Chronic Kidney Disease.    Anion gap 5 5 - 15   No results found.  Review of Systems  All other systems reviewed and are negative.   Height 5' 2" (1.575 m), weight 88.5 kg (195 lb). Physical Exam  Constitutional:  WD, WN, NAD HEENT:  NCAT, EOMI Neuro/Psych:  Alert & oriented to person, place, and  time; appropriate mood & affect Lymphatic: No generalized UE edema or lymphadenopathy Extremities / MSK:  Both UE are normal with respect to appearance, ranges of motion, joint stability, muscle strength/tone, sensation, & perfusion except as otherwise noted:  Bilateral upper extremities are normal in appearance.  There is altered sensibility with light touch bilaterally in the median nerve distribution.  Grip strength testing in position 2 right 30 left 15.  Monofilament testing on the right thumb 4.31 index long and ring 3.61 small finger 2.83; left 4.31 thumb index 2.83, long 2.83, ring 3.61, small 2.83.   Labs / Xrays:  No radiographic studies obtained today.  Nerve conduction studies done by [facility]  on 03/05/2016 indicate that she has bilateral moderately severe right and left carpal tunnel syndrome with the right being worse in the left.  Please see report for further information.  Assessment: Bilateral carpal tunnel syndrome  Plan:  The findings were discussed the patient.  She would like to move forward with endoscopic carpal tunnel release on the right.  She was told that she may have significant amounts of pain after surgery secondary to being on narcotics at such a high dose currently. The details of the operative procedure were discussed with the patient.  Questions were invited and answered.  In addition to the goal of the procedure, the risks of the procedure to include but not limited to bleeding; infection; damage to the nerves or blood vessels that could result in bleeding, numbness, weakness, chronic pain, and the need for additional procedures; stiffness; the need for revision surgery; and anesthetic risks were reviewed.  No specific outcome was guaranteed or implied.  Informed consent was obtained.   THOMPSON, DAVID A., MD 03/20/2016, 3:38 PM   

## 2016-04-08 NOTE — Anesthesia Preprocedure Evaluation (Addendum)
Anesthesia Evaluation  Patient identified by MRN, date of birth, ID band Patient awake    Reviewed: Allergy & Precautions, NPO status , Patient's Chart, lab work & pertinent test results  Airway Mallampati: I  TM Distance: >3 FB Neck ROM: Full    Dental  (+) Teeth Intact, Dental Advisory Given   Pulmonary Current Smoker,    breath sounds clear to auscultation       Cardiovascular hypertension, Pt. on medications + Peripheral Vascular Disease   Rhythm:Regular Rate:Normal     Neuro/Psych    GI/Hepatic   Endo/Other  diabetes, Well Controlled, Type 2, Insulin DependentMorbid obesity  Renal/GU      Musculoskeletal   Abdominal   Peds  Hematology   Anesthesia Other Findings   Reproductive/Obstetrics                            Anesthesia Physical Anesthesia Plan  ASA: II  Anesthesia Plan: MAC   Post-op Pain Management:    Induction: Intravenous  Airway Management Planned: Simple Face Mask  Additional Equipment:   Intra-op Plan:   Post-operative Plan:   Informed Consent: I have reviewed the patients History and Physical, chart, labs and discussed the procedure including the risks, benefits and alternatives for the proposed anesthesia with the patient or authorized representative who has indicated his/her understanding and acceptance.   Dental advisory given  Plan Discussed with: CRNA, Anesthesiologist and Surgeon  Anesthesia Plan Comments:         Anesthesia Quick Evaluation

## 2016-04-08 NOTE — Discharge Instructions (Addendum)
Discharge Instructions   You have a light dressing on your hand.  You may begin gentle motion of your fingers and hand immediately, but you should not do any heavy lifting or gripping.  Elevate your hand to reduce pain & swelling of the digits.  Ice over the operative site and or in the arm pit may be helpful to reduce pain & swelling.  DO NOT USE HEAT. Pain medicine has been prescribed for you.  Use your medicine as needed over the first 48 hours, and then you can begin to taper your use. You may use Tylenol in place of your prescribed pain medication, but not IN ADDITION to it. Leave the dressing in place until the third day after your surgery and then remove it, leaving it open to air.  After the bandage has been removed you may shower, but do not soak the incision.  You may drive a car when you are off of prescription pain medications and can safely control your vehicle with both hands. We will address whether therapy will be required or not when you return to the office. You may have already made your follow-up appointment when we completed your preop visit.  If not, please call our office today or the next business day to make your return appointment for 10-15 days after surgery.   Please call [phone] during normal business hours or [phone] after hours for any problems. Including the following:  - excessive redness of the incisions - drainage for more than 4 days - fever of more than 101.5 F  *Please note that pain medications will not be refilled after hours or on weekends.   TLS Drain Instructions You have a drain tube in place to help limit the amount of blood that collects under your skin in the early post-operative period.  The amount it drains will vary from person-to-person and is dependent upon many factors.  You will have 1-2 extra drain tubes sent with you from the facility.  You should change the tube according to these instructions below when the tube is about half  full.  BE SURE TO SLIDE THE CLAMP TO THE CLOSED POSITION BEFORE CHANGING THE TUBE.    RE-OPEN THE CLAMP ONCE THE GLASS EVACUATION TUBE HAS BEEN CHANGED.  24 hours after surgery the amount of drainage will likely be negligible and it will be time to remove the tube and discard it.  Simply remove the tape that secures the flexible plastic drainage tube to the bandage and briskly pull the tube straight out.  You will likely feel a little discomfort during this process, but the tube should slide out as it is not sewn or otherwise secured directly to your body.  It is merely held in place by the bandage itself.                              If you are not clear about when your first post-op appointment is scheduled, please call the office on the next business day to inquire about it.  Please also call the office if you have any other questions [phone]).     Post Anesthesia Home Care Instructions  Activity: Get plenty of rest for the remainder of the day. A responsible adult should stay with you for 24 hours following the procedure.  For the next 24 hours, DO NOT: -Drive a car -Advertising copywriter -Drink alcoholic beverages -Take any medication unless instructed by  your physician -Make any legal decisions or sign important papers.  Meals: Start with liquid foods such as gelatin or soup. Progress to regular foods as tolerated. Avoid greasy, spicy, heavy foods. If nausea and/or vomiting occur, drink only clear liquids until the nausea and/or vomiting subsides. Call your physician if vomiting continues.  Special Instructions/Symptoms: Your throat may feel dry or sore from the anesthesia or the breathing tube placed in your throat during surgery. If this causes discomfort, gargle with warm salt water. The discomfort should disappear within 24 hours.  If you had a scopolamine patch placed behind your ear for the management of post- operative nausea and/or vomiting:  1. The medication in the  patch is effective for 72 hours, after which it should be removed.  Wrap patch in a tissue and discard in the trash. Wash hands thoroughly with soap and water. 2. You may remove the patch earlier than 72 hours if you experience unpleasant side effects which may include dry mouth, dizziness or visual disturbances. 3. Avoid touching the patch. Wash your hands with soap and water after contact with the patch.

## 2016-04-08 NOTE — Anesthesia Procedure Notes (Signed)
Procedure Name: MAC Date/Time: 04/08/2016 2:12 PM Performed by: Curly ShoresRAFT, Travonte Byard W Pre-anesthesia Checklist: Patient identified, Emergency Drugs available, Suction available and Patient being monitored Patient Re-evaluated:Patient Re-evaluated prior to inductionOxygen Delivery Method: Simple face mask Preoxygenation: Pre-oxygenation with 100% oxygen Intubation Type: IV induction Dental Injury: Teeth and Oropharynx as per pre-operative assessment

## 2016-04-08 NOTE — Transfer of Care (Signed)
Immediate Anesthesia Transfer of Care Note  Patient: Tina RichesNancy J [name]  Procedure(s) Performed: Procedure(s): LEFT CARPAL TUNNEL RELEASE ENDOSCOPIC (Left)  Patient Location: PACU  Anesthesia Type:MAC  Level of Consciousness: awake, alert , oriented and patient cooperative  Airway & Oxygen Therapy: Patient Spontanous Breathing and Patient connected to face mask oxygen  Post-op Assessment: Report given to RN, Post -op Vital signs reviewed and stable and Patient moving all extremities  Post vital signs: Reviewed and stable  Last Vitals:  Vitals:   04/08/16 1303  BP: 130/69  Pulse: 68  Resp: 18  Temp: 36.9 C    Last Pain:  Vitals:   04/08/16 1303  TempSrc: Oral  PainSc:          Complications: No apparent anesthesia complications

## 2016-04-08 NOTE — Interval H&P Note (Signed)
History and Physical Interval Note:  04/08/2016 1:00 PM  [name]  has presented today for surgery, with the diagnosis of LEFT CARPAL TUNNEL SYNDROME G56.02.  She is just about 2 weeks s/p R ECTR, doing well, and wishing to proceed on the left.  The various methods of treatment have been discussed with the patient and family. After consideration of risks, benefits and other options for treatment, the patient has consented to  Procedure(s): LEFT CARPAL TUNNEL RELEASE ENDOSCOPIC (Left) as a surgical intervention .  The patient's history has been reviewed, patient examined, no change in status, stable for surgery.  I have reviewed the patient's chart and labs.  Questions were answered to the patient's satisfaction.     Adiel Mcnamara A.

## 2016-04-08 NOTE — Anesthesia Postprocedure Evaluation (Signed)
Anesthesia Post Note  Patient: [name]  Procedure(s) Performed: Procedure(s) (LRB): LEFT CARPAL TUNNEL RELEASE ENDOSCOPIC (Left)  Patient location during evaluation: PACU Anesthesia Type: MAC Level of consciousness: awake and alert Pain management: pain level controlled Vital Signs Assessment: post-procedure vital signs reviewed and stable Respiratory status: spontaneous breathing, nonlabored ventilation and respiratory function stable Cardiovascular status: stable and blood pressure returned to baseline Anesthetic complications: no    Last Vitals:  Vitals:   04/08/16 1515 04/08/16 1552  BP: 91/73 113/73  Pulse: 72 65  Resp: 16 20  Temp:  36.4 C    Last Pain:  Vitals:   04/08/16 1552  TempSrc: Oral  PainSc: 5                  Phuoc Huy A

## 2016-04-09 ENCOUNTER — Encounter (HOSPITAL_BASED_OUTPATIENT_CLINIC_OR_DEPARTMENT_OTHER): Payer: Self-pay | Admitting: Orthopedic Surgery

## 2016-06-04 ENCOUNTER — Other Ambulatory Visit (HOSPITAL_COMMUNITY): Payer: Self-pay | Admitting: Family Medicine

## 2016-06-04 DIAGNOSIS — Z1231 Encounter for screening mammogram for malignant neoplasm of breast: Secondary | ICD-10-CM

## 2016-06-09 ENCOUNTER — Ambulatory Visit (HOSPITAL_COMMUNITY)
Admission: RE | Admit: 2016-06-09 | Discharge: 2016-06-09 | Disposition: A | Discharge status: Home / Self Care | Payer: Medicaid Other | Source: Ambulatory Visit | Attending: Family Medicine | Admitting: Family Medicine

## 2016-06-09 DIAGNOSIS — Z1231 Encounter for screening mammogram for malignant neoplasm of breast: Secondary | ICD-10-CM | POA: Diagnosis not present

## 2017-03-15 ENCOUNTER — Encounter (HOSPITAL_COMMUNITY): Payer: Self-pay | Admitting: Emergency Medicine

## 2017-03-15 ENCOUNTER — Emergency Department (HOSPITAL_COMMUNITY)
Admission: EM | Admit: 2017-03-15 | Discharge: 2017-03-15 | Disposition: A | Discharge status: Home / Self Care | Payer: Medicare Other | Attending: Emergency Medicine | Admitting: Emergency Medicine

## 2017-03-15 DIAGNOSIS — H1132 Conjunctival hemorrhage, left eye: Secondary | ICD-10-CM | POA: Insufficient documentation

## 2017-03-15 DIAGNOSIS — E7800 Pure hypercholesterolemia, unspecified: Secondary | ICD-10-CM | POA: Insufficient documentation

## 2017-03-15 DIAGNOSIS — F17210 Nicotine dependence, cigarettes, uncomplicated: Secondary | ICD-10-CM | POA: Insufficient documentation

## 2017-03-15 DIAGNOSIS — Z79899 Other long term (current) drug therapy: Secondary | ICD-10-CM | POA: Diagnosis not present

## 2017-03-15 DIAGNOSIS — Z794 Long term (current) use of insulin: Secondary | ICD-10-CM | POA: Insufficient documentation

## 2017-03-15 DIAGNOSIS — I10 Essential (primary) hypertension: Secondary | ICD-10-CM | POA: Insufficient documentation

## 2017-03-15 DIAGNOSIS — E119 Type 2 diabetes mellitus without complications: Secondary | ICD-10-CM | POA: Insufficient documentation

## 2017-03-15 DIAGNOSIS — L509 Urticaria, unspecified: Secondary | ICD-10-CM | POA: Insufficient documentation

## 2017-03-15 DIAGNOSIS — H5712 Ocular pain, left eye: Secondary | ICD-10-CM | POA: Diagnosis present

## 2017-03-15 MED ORDER — FAMOTIDINE 20 MG PO TABS: 20.0000 mg | ORAL_TABLET | Freq: Two times a day (BID) | ORAL | 0 refills | Status: DC

## 2017-03-15 MED ORDER — FAMOTIDINE 20 MG PO TABS
20.0000 mg | ORAL_TABLET | Freq: Once | ORAL | Status: AC
  Administered 2017-03-15: 20 mg via ORAL
  Filled 2017-03-15: qty 1

## 2017-03-15 MED ORDER — PREDNISONE 20 MG PO TABS: 60.0000 mg | ORAL_TABLET | Freq: Every day | ORAL | 0 refills | Status: DC

## 2017-03-15 MED ORDER — DIPHENHYDRAMINE HCL 25 MG PO CAPS: 25.0000 mg | ORAL_CAPSULE | Freq: Four times a day (QID) | ORAL | 0 refills | Status: DC | PRN

## 2017-03-15 MED ORDER — PREDNISONE 20 MG PO TABS
60.0000 mg | ORAL_TABLET | Freq: Once | ORAL | Status: AC
  Administered 2017-03-15: 60 mg via ORAL
  Filled 2017-03-15: qty 3

## 2017-03-15 MED ORDER — DIPHENHYDRAMINE HCL 25 MG PO CAPS
50.0000 mg | ORAL_CAPSULE | Freq: Once | ORAL | Status: AC
  Administered 2017-03-15: 50 mg via ORAL
  Filled 2017-03-15: qty 2

## 2017-03-15 NOTE — Discharge Instructions (Signed)
Return to the ED with any SOB or swelling of lips or tongue. Take medications as prescribed.

## 2017-03-15 NOTE — ED Provider Notes (Signed)
[facility]-EMERGENCY DEPT Provider Note   CSN: [id number] Arrival date & time: 03/15/17  0109     History   Chief Complaint Chief Complaint  Patient presents with  . Eye Pain  . Allergic Reaction    HPI [name] is a 61 y.o. female.  Patient presents with onset tonight of left eye pain and discoloration. No known injury. No visual changes, fever, facial swelling. She also complains of a rash that also started tonight, mainly to upper extremities that is significantly pruritic. No SOB, lip or tongue swelling, or difficulty swallowing. She ahs not taken anything at home for symptoms.    The history is provided by the patient and the spouse. No language interpreter was used.    Past Medical History:  Diagnosis Date  . Arthritis    degenerative lumbar spine   . 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 Depression   . High cholesterol   . History of cerebral aneurysm repair   . Hypertension    states under control with med., has been on med. x "a long time"  . Insulin dependent diabetes mellitus [facility])     Patient Active Problem List   Diagnosis Date Noted  . Neuralgia involving scalp 02/09/2016  . Cerebral aneurysm, nonruptured 02/15/2015  . Cerebral aneurysm 09/07/2014    Past Surgical History:  Procedure Laterality Date  . CARPAL TUNNEL RELEASE Right 03/25/2016   Procedure: RIGHT ENDOSCOPIC CARPAL TUNNEL RELEASE;  Surgeon: Mack Hook, MD;  Location: [facility];  Service: Orthopedics;  Laterality: Right;  . CARPAL TUNNEL RELEASE Left 04/08/2016   Procedure: LEFT CARPAL TUNNEL RELEASE ENDOSCOPIC;  Surgeon: Mack Hook, MD;  Location: [facility];  Service: Orthopedics;  Laterality: Left;  . CEREBRAL ANGIOGRAM  07/18/2014  . CRANIOTOMY Right 02/15/2015   Procedure: Right Frontotemporal Craniotomy for aneurysm clipping;  Surgeon: Lisbeth Renshaw, MD;  Location: [facility];  Service: Neurosurgery;  Laterality: Right;  right  .  RADIOLOGY WITH ANESTHESIA N/A 09/07/2014   Procedure: Embolization Stent Supported Coil of Aneurysm;  Surgeon: Lisbeth Renshaw, MD;  Location: [facility];  Service: Radiology;  Laterality: N/A;  . TUBAL LIGATION    . UMBILICAL HERNIA REPAIR  05/14/1007    OB History    Gravida Para Term Preterm AB Living   SAB TAB Ectopic Multiple Live Births                   Home Medications    Prior to Admission medications   Medication Sig Start Date End Date Taking? Authorizing Provider  atorvastatin (LIPITOR) 20 MG tablet Take 20 mg by mouth daily.    [provider]  gabapentin (NEURONTIN) 300 MG capsule Take 1 capsule (300 mg total) by mouth 3 (three) times daily. 02/12/16   Anson Fret, MD  HYDROcodone-acetaminophen (NORCO/VICODIN) 5-325 MG per tablet Take 1 tablet by mouth every 6 (six) hours as needed for moderate pain.    [provider]  insulin aspart (NOVOLOG) 100 UNIT/ML FlexPen Inject 8 Units into the skin 4 (four) times daily.    [provider]  Insulin Glargine (LANTUS) 100 UNIT/ML Solostar Pen Inject 20 Units into the skin at bedtime.     [provider]  metFORMIN (GLUCOPHAGE) 500 MG tablet Take by mouth 2 (two) times daily with a meal.    [provider]  olmesartan-hydrochlorothiazide (BENICAR HCT) 20-12.5 MG per tablet  Take 1 tablet by mouth daily.    [provider]  oxyCODONE (ROXICODONE) 5 MG immediate release tablet Take 1 tablet (5 mg total) by mouth every 6 (six) hours as needed for severe pain or breakthrough pain (to be used in addition to hydrocodone as needed). 04/08/16   Mack Hook, MD  rosuvastatin (CRESTOR) 20 MG tablet Take 20 mg by mouth daily.    [provider]  venlafaxine XR (EFFEXOR-XR) 75 MG 24 hr capsule Take 75 mg by mouth daily with breakfast.    [provider]  vitamin B-12 (CYANOCOBALAMIN) 100 MCG tablet Take 100 mcg by mouth daily.    [provider]     Family History Family History  Problem Relation Age of Onset  . Cancer Father   . COPD Mother     Social History Social History  Substance Use Topics  . Smoking status: Current Every Day Smoker    Packs/day: 0.50    Years: 40.00    Types: Cigarettes  . Smokeless tobacco: Never Used  . Alcohol use No     Allergies   Patient has no known allergies.   Review of Systems Review of Systems  Constitutional: Negative for chills and fever.  HENT: Negative.  Negative for facial swelling and trouble swallowing.   Eyes: Positive for pain and redness. Negative for photophobia, discharge and visual disturbance.  Respiratory: Negative.  Negative for shortness of breath and wheezing.   Cardiovascular: Negative.   Musculoskeletal: Negative.  Negative for myalgias.  Skin: Positive for rash.  Neurological: Negative.      Physical Exam Updated Vital Signs BP (!) 141/88 (BP Location: Right Arm)   Pulse 80   Temp 97.9 F (36.6 C) (Oral)   Resp 18   Ht  (1.651 m)   Wt 93.9 kg (207 lb)   SpO2 98%   BMI 34.45 kg/m   Physical Exam  Constitutional: She appears well-developed and well-nourished. No distress.  HENT:  Head: Normocephalic and atraumatic.  Eyes:  Complete medial subconjunctival hemorrhage of left eye. No lid swelling or redness. No hyphema or corneal cloudiness. PERRL.   Cardiovascular: Normal rate and regular rhythm.   Pulmonary/Chest: She has no wheezes.  Musculoskeletal: Normal range of motion.  Skin: Skin is warm and dry.  Raised, erythematous welts to upper extremities bilaterally c/w hives.     ED Treatments / Results  Labs (all labs ordered are listed, but only abnormal results are displayed) Labs Reviewed - No data to display  EKG  EKG Interpretation None       Radiology No results found.  Procedures Procedures (including critical care time)  Medications Ordered in ED Medications  diphenhydrAMINE (BENADRYL) capsule 50 mg (not  administered)  predniSONE (DELTASONE) tablet 60 mg (not administered)  famotidine (PEPCID) tablet 20 mg (not administered)     Initial Impression / Assessment and Plan / ED Course  I have reviewed the triage vital signs and the nursing notes.  Pertinent labs & imaging results that were available during my care of the patient were reviewed by me and considered in my medical decision making (see chart for details).     Patient presents with onset allergic rash c/w hives starting tonight. No symptoms of severe reaction. Also has uncomplicated subconjunctival hemorrhage of left eye without known trauma.   Started the patient on prednisone, benadryl and pepcid. Return precautions discussed. Patient reassured regarding eye hemorrhage.   Final Clinical Impressions(s) / ED Diagnoses   Final diagnoses:  None   1. Hives 2. Left subconjunctival hemorrhage  New Prescriptions New Prescriptions   No medications on file     Danne Harbor 03/15/17 2311    Dione Booze, MD 03/16/17 [phone]

## 2017-03-15 NOTE — ED Triage Notes (Signed)
Pt woke up today with left side eye pain and a eye looking almost black inside, having hive all over her body, no respiratory distress at this time. Pt denies eating something different or using new soap.

## 2017-03-16 ENCOUNTER — Emergency Department (HOSPITAL_COMMUNITY): Payer: Medicare Other

## 2017-03-16 ENCOUNTER — Emergency Department (HOSPITAL_COMMUNITY)
Admission: EM | Admit: 2017-03-16 | Discharge: 2017-03-16 | Disposition: A | Discharge status: Home / Self Care | Payer: Medicare Other | Attending: Emergency Medicine | Admitting: Emergency Medicine

## 2017-03-16 ENCOUNTER — Encounter (HOSPITAL_COMMUNITY): Payer: Self-pay | Admitting: *Deleted

## 2017-03-16 DIAGNOSIS — E1165 Type 2 diabetes mellitus with hyperglycemia: Secondary | ICD-10-CM | POA: Insufficient documentation

## 2017-03-16 DIAGNOSIS — F17210 Nicotine dependence, cigarettes, uncomplicated: Secondary | ICD-10-CM | POA: Diagnosis not present

## 2017-03-16 DIAGNOSIS — L258 Unspecified contact dermatitis due to other agents: Secondary | ICD-10-CM | POA: Diagnosis not present

## 2017-03-16 DIAGNOSIS — Z79899 Other long term (current) drug therapy: Secondary | ICD-10-CM | POA: Diagnosis not present

## 2017-03-16 DIAGNOSIS — H1132 Conjunctival hemorrhage, left eye: Secondary | ICD-10-CM | POA: Insufficient documentation

## 2017-03-16 DIAGNOSIS — R739 Hyperglycemia, unspecified: Secondary | ICD-10-CM

## 2017-03-16 DIAGNOSIS — I10 Essential (primary) hypertension: Secondary | ICD-10-CM | POA: Diagnosis not present

## 2017-03-16 DIAGNOSIS — R21 Rash and other nonspecific skin eruption: Secondary | ICD-10-CM | POA: Diagnosis present

## 2017-03-16 DIAGNOSIS — R0789 Other chest pain: Secondary | ICD-10-CM | POA: Insufficient documentation

## 2017-03-16 DIAGNOSIS — Z794 Long term (current) use of insulin: Secondary | ICD-10-CM | POA: Insufficient documentation

## 2017-03-16 LAB — CBG MONITORING, ED
Glucose-Capillary: 538 mg/dL (ref 65–99)
Glucose-Capillary: 429 mg/dL — ABNORMAL HIGH (ref 65–99)
Glucose-Capillary: 334 mg/dL — ABNORMAL HIGH (ref 65–99)

## 2017-03-16 LAB — URINALYSIS, ROUTINE W REFLEX MICROSCOPIC
Bacteria, UA: NONE SEEN
Bilirubin Urine: NEGATIVE
Glucose, UA: >=500 mg/dL — AB
Hgb urine dipstick: NEGATIVE
Ketones, ur: NEGATIVE mg/dL
Leukocytes, UA: NEGATIVE
Nitrite: NEGATIVE
Protein, ur: NEGATIVE mg/dL
Specific Gravity, Urine: 1.024 (ref 1.005–1.030)
pH: 5 (ref 5.0–8.0)

## 2017-03-16 LAB — BASIC METABOLIC PANEL
Anion gap: 12 (ref 5–15)
BUN: 28 mg/dL — ABNORMAL HIGH (ref 6–20)
CO2: 22 mmol/L (ref 22–32)
Calcium: 8.9 mg/dL (ref 8.9–10.3)
Chloride: 99 mmol/L — ABNORMAL LOW (ref 101–111)
Creatinine, Ser: 1.25 mg/dL — ABNORMAL HIGH (ref 0.44–1.00)
GFR calc Af Amer: 53 mL/min — ABNORMAL LOW (ref >60)
GFR calc non Af Amer: 46 mL/min — ABNORMAL LOW (ref >60)
Glucose, Bld: 550 mg/dL (ref 65–99)
Potassium: 3.9 mmol/L (ref 3.5–5.1)
Sodium: 133 mmol/L — ABNORMAL LOW (ref 135–145)

## 2017-03-16 MED ORDER — INSULIN ASPART 100 UNIT/ML ~~LOC~~ SOLN
6.0000 [IU] | Freq: Once | SUBCUTANEOUS | Status: AC
  Administered 2017-03-16: 6 [IU] via INTRAVENOUS
  Filled 2017-03-16: qty 1

## 2017-03-16 MED ORDER — SODIUM CHLORIDE 0.9 % IV BOLUS (SEPSIS)
1000.0000 mL | Freq: Once | INTRAVENOUS | Status: AC
  Administered 2017-03-16: 1000 mL via INTRAVENOUS

## 2017-03-16 MED ORDER — INSULIN ASPART 100 UNIT/ML ~~LOC~~ SOLN: 6.0000 [IU] | Freq: Once | SUBCUTANEOUS | Status: DC

## 2017-03-16 MED ORDER — INSULIN ASPART 100 UNIT/ML ~~LOC~~ SOLN
6.0000 [IU] | Freq: Once | SUBCUTANEOUS | Status: AC
  Administered 2017-03-16: 6 [IU] via INTRAVENOUS

## 2017-03-16 MED ORDER — SODIUM CHLORIDE 0.9 % IV BOLUS (SEPSIS)
500.0000 mL | Freq: Once | INTRAVENOUS | Status: AC
  Administered 2017-03-16: 500 mL via INTRAVENOUS

## 2017-03-16 MED ORDER — SODIUM CHLORIDE 0.9 % IV BOLUS (SEPSIS): 1000.0000 mL | Freq: Once | INTRAVENOUS | Status: DC

## 2017-03-16 MED ORDER — INSULIN ASPART 100 UNIT/ML ~~LOC~~ SOLN
5.0000 [IU] | Freq: Once | SUBCUTANEOUS | Status: AC
  Administered 2017-03-16: 5 [IU] via SUBCUTANEOUS

## 2017-03-16 NOTE — Discharge Instructions (Signed)
You will need to be very strict with your diabetic diet while on the prednisone/steroids. You have sore muscles in your chest wall, you can take acetaminophen for pain and use ice or heat for comfort.  Recheck if you get worse.

## 2017-03-16 NOTE — ED Triage Notes (Signed)
Pt c/o left eye pain, was seen at [facility] last night, started on prednisone, also concerned tonight due to increase in urination, elevated blood sugar, pt reports that her blood sugar at home was over 500 and right flank pain that started tonight,

## 2017-03-16 NOTE — ED Notes (Signed)
CRITICAL VALUE ALERT  Critical Value:  Glucose 550  Date & Time Notied:  03-16-17 2:50  Provider Notified: Dr. Lynelle Doctor  Orders Received/Actions taken: none

## 2017-03-16 NOTE — ED Provider Notes (Signed)
[facility]ER[facility] DEPT Provider Note   CSN: [id number] Arrival date & time: 03/16/17  0043  Time seem 01:05 AM   History   Chief Complaint Chief Complaint  Patient presents with  . Hyperglycemia    HPI [name] is a 61 y.o. female.  HPI  Patient is here with several complaints.  She states 2 days ago she developed blood around her left eye. She also developed a rash that is pruritic on both of her upper arms after working in her father in law's yard. She was seen for the same at [facility] on September 23 and diagnosed with  subconjunctival hematoma and she was started on prednisone and Benadryl for her rash.  She states this evening "a while ago" her blood sugar was 575. She states earlier today was in the 300s. She states typically gets in the 120s.she states she was told anytime her blood sugars in the 500s to go to the ED.  She also states she started having right sided pain about an hour ago. She denies coughing although she was coughing frequently during my interview. She initially pointed to her right flank area. She denies dysuria but has had frequency for about 3 hours. She denies hematuria, fever, chills, nausea, or vomiting. She denies being on any blood thinners.  PCP Gareth Morgan, MD   Past Medical History:  Diagnosis Date  . Arthritis    degenerative lumbar spine   . 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 Depression   . High cholesterol   . History of cerebral aneurysm repair   . Hypertension    states under control with med., has been on med. x "a long time"  . Insulin dependent diabetes mellitus [facility])     Patient Active Problem List   Diagnosis Date Noted  . Neuralgia involving scalp 02/09/2016  . Cerebral aneurysm, nonruptured 02/15/2015  . Cerebral aneurysm 09/07/2014    Past Surgical History:  Procedure Laterality Date  . CARPAL TUNNEL RELEASE Right 03/25/2016   Procedure: RIGHT ENDOSCOPIC CARPAL TUNNEL RELEASE;  Surgeon: Mack Hook,  MD;  Location: [facility];  Service: Orthopedics;  Laterality: Right;  . CARPAL TUNNEL RELEASE Left 04/08/2016   Procedure: LEFT CARPAL TUNNEL RELEASE ENDOSCOPIC;  Surgeon: Mack Hook, MD;  Location: [facility];  Service: Orthopedics;  Laterality: Left;  . CEREBRAL ANGIOGRAM  07/18/2014  . CRANIOTOMY Right 02/15/2015   Procedure: Right Frontotemporal Craniotomy for aneurysm clipping;  Surgeon: Lisbeth Renshaw, MD;  Location: [facility];  Service: Neurosurgery;  Laterality: Right;  right  . RADIOLOGY WITH ANESTHESIA N/A 09/07/2014   Procedure: Embolization Stent Supported Coil of Aneurysm;  Surgeon: Lisbeth Renshaw, MD;  Location: [facility] OR;  Service: Radiology;  Laterality: N/A;  . TUBAL LIGATION    . UMBILICAL HERNIA REPAIR  05/14/1007    OB History    Gravida Para Term Preterm AB Living   SAB TAB Ectopic Multiple Live Births                   Home Medications    Prior to Admission medications   Medication Sig Start Date End Date Taking? Authorizing Provider  atorvastatin (LIPITOR) 20 MG tablet Take 20 mg by mouth daily.   Yes [provider]  diphenhydrAMINE (BENADRYL) 25 mg capsule Take 1 capsule (25 mg total) by mouth every 6 (six) hours as needed. 03/15/17  Yes Elpidio Anis, PA-C  famotidine (PEPCID) 20 MG tablet Take 1 tablet (20 mg total) by mouth 2 (two) times daily. 03/15/17  Yes Upstill, Melvenia Beam, PA-C  gabapentin (NEURONTIN) 300 MG capsule Take 1 capsule (300 mg total) by mouth 3 (three) times daily. 02/12/16  Yes Anson Fret, MD  insulin aspart (NOVOLOG) 100 UNIT/ML FlexPen Inject 8 Units into the skin 4 (four) times daily.   Yes [provider]  Insulin Glargine (LANTUS) 100 UNIT/ML Solostar Pen Inject 20 Units into the skin at bedtime.    Yes [provider]  metFORMIN (GLUCOPHAGE) 500 MG tablet Take by mouth 2 (two) times daily with a meal.   Yes [provider]    olmesartan-hydrochlorothiazide (BENICAR HCT) 20-12.5 MG per tablet Take 1 tablet by mouth daily.   Yes [provider]  oxyCODONE (ROXICODONE) 5 MG immediate release tablet Take 1 tablet (5 mg total) by mouth every 6 (six) hours as needed for severe pain or breakthrough pain (to be used in addition to hydrocodone as needed). 04/08/16  Yes Mack Hook, MD  predniSONE (DELTASONE) 20 MG tablet Take 3 tablets (60 mg total) by mouth daily. 03/15/17  Yes Elpidio Anis, PA-C  venlafaxine XR (EFFEXOR-XR) 75 MG 24 hr capsule Take 75 mg by mouth daily with breakfast.   Yes [provider]  vitamin B-12 (CYANOCOBALAMIN) 100 MCG tablet Take 100 mcg by mouth daily.   Yes [provider]  HYDROcodone-acetaminophen (NORCO/VICODIN) 5-325 MG per tablet Take 1 tablet by mouth every 6 (six) hours as needed for moderate pain.    [provider]  rosuvastatin (CRESTOR) 20 MG tablet Take 20 mg by mouth daily.    [provider]    Family History Family History  Problem Relation Age of Onset  . Cancer Father   . COPD Mother     Social History Social History  Substance Use Topics  . Smoking status: Current Every Day Smoker    Packs/day: 0.50    Years: 40.00    Types: Cigarettes  . Smokeless tobacco: Never Used  . Alcohol use No  on disability for brain aneurysm repaired   Allergies   Patient has no known allergies.   Review of Systems Review of Systems  All other systems reviewed and are negative.    Physical Exam Updated Vital Signs BP (!) 185/83   Pulse 86   Temp 98 F (36.7 C) (Oral)   Resp 20   Ht 5' (1.524 m)   Wt 93.9 kg (207 lb)   SpO2 98%   BMI 40.43 kg/m   Vital signs normal Except hypertension   Physical Exam  Constitutional: She is oriented to person, place, and time. She appears well-developed and well-nourished.  Non-toxic appearance. She does not appear ill. No distress.  HENT:  Head: Normocephalic and atraumatic.   Right Ear: External ear normal.  Left Ear: External ear normal.  Nose: Nose normal. No mucosal edema or rhinorrhea.  Mouth/Throat: Oropharynx is clear and moist and mucous membranes are normal. No dental abscesses or uvula swelling.  Eyes: Pupils are equal, round, and reactive to light. EOM are normal.    Subconjunctival hematoma of the left eye involving the whole medial aspect of the eye and less so on the lateral aspect.   Neck: Normal range of motion and full passive range of motion without pain. Neck supple.  Cardiovascular: Normal rate, regular rhythm and normal heart sounds.  Exam reveals no gallop and no friction rub.   No murmur heard.  Pulmonary/Chest: Effort normal and breath sounds normal. No respiratory distress. She has no wheezes. She has no rhonchi. She has no rales. She exhibits no tenderness and no crepitus.    Coughing, tender over her lateral lower right rib cage without crepitence  Abdominal: Soft. Normal appearance and bowel sounds are normal. She exhibits no distension. There is no tenderness. There is no rebound and no guarding.  Musculoskeletal: Normal range of motion. She exhibits no edema or tenderness.  Moves all extremities well.   Neurological: She is alert and oriented to person, place, and time. She has normal strength. No cranial nerve deficit.  Skin: Skin is warm, dry and intact. No rash noted. No erythema. No pallor.  She has several raised areas on the posterior upper arms bilaterally c/w hives  Psychiatric: She has a normal mood and affect. Her speech is normal and behavior is normal. Her mood appears not anxious.  Nursing note and vitals reviewed.    ED Treatments / Results  Labs (all labs ordered are listed, but only abnormal results are displayed) Results for orders placed or performed during the hospital encounter of 03/16/17  Basic metabolic panel  Result Value Ref Range   Sodium 133 (L) 135 - 145 mmol/L   Potassium 3.9 3.5 - 5.1 mmol/L    Chloride 99 (L) 101 - 111 mmol/L   CO2 22 22 - 32 mmol/L   Glucose, Bld 550 (HH) 65 - 99 mg/dL   BUN 28 (H) 6 - 20 mg/dL   Creatinine, Ser 1.61 (H) 0.44 - 1.00 mg/dL   Calcium 8.9 8.9 - 09.6 mg/dL   GFR calc non Af Amer 46 (L) >60 mL/min   GFR calc Af Amer 53 (L) >60 mL/min   Anion gap 12 5 - 15  Urinalysis, Routine w reflex microscopic  Result Value Ref Range   Color, Urine STRAW (A) YELLOW   APPearance CLEAR CLEAR   Specific Gravity, Urine 1.024 1.005 - 1.030   pH 5.0 5.0 - 8.0   Glucose, UA >=500 (A) NEGATIVE mg/dL   Hgb urine dipstick NEGATIVE NEGATIVE   Bilirubin Urine NEGATIVE NEGATIVE   Ketones, ur NEGATIVE NEGATIVE mg/dL   Protein, ur NEGATIVE NEGATIVE mg/dL   Nitrite NEGATIVE NEGATIVE   Leukocytes, UA NEGATIVE NEGATIVE   RBC / HPF 0-5 0 - 5 RBC/hpf   WBC, UA 0-5 0 - 5 WBC/hpf   Bacteria, UA NONE SEEN NONE SEEN   Squamous Epithelial / LPF 0-5 (A) NONE SEEN  CBG monitoring, ED  Result Value Ref Range   Glucose-Capillary 538 (HH) 65 - 99 mg/dL  CBG monitoring, ED  Result Value Ref Range   Glucose-Capillary 429 (H) 65 - 99 mg/dL  CBG monitoring, ED  Result Value Ref Range   Glucose-Capillary 334 (H) 65 - 99 mg/dL   Laboratory interpretation all normal except hyperglycemia, glucosuria, renal insufficiency    EKG  EKG Interpretation None       Radiology Dg Ribs Unilateral W/chest Right  Result Date: 03/16/2017 CLINICAL DATA:  Right rib pain EXAM: RIGHT RIBS AND CHEST - 3+ VIEW COMPARISON:  None. FINDINGS: No fracture or other bone lesions are seen involving the ribs. There is no evidence of pneumothorax or pleural effusion. There is right basilar atelectasis. Heart size and mediastinal contours are within normal limits. IMPRESSION: No rib fracture. Electronically Signed   By: Deatra Robinson M.D.   On: 03/16/2017 01:56    Procedures Procedures (including critical care time)  Medications Ordered in ED  Medications  insulin aspart (novoLOG) injection 5 Units  (not administered)  sodium chloride 0.9 % bolus 1,000 mL (0 mLs Intravenous Stopped 03/16/17 0339)  sodium chloride 0.9 % bolus 500 mL (0 mLs Intravenous Stopped 03/16/17 0340)  insulin aspart (novoLOG) injection 6 Units (6 Units Intravenous Given 03/16/17 0210)  sodium chloride 0.9 % bolus 1,000 mL (0 mLs Intravenous Stopped 03/16/17 0502)  insulin aspart (novoLOG) injection 6 Units (6 Units Intravenous Given 03/16/17 0352)     Initial Impression / Assessment and Plan / ED Course  I have reviewed the triage vital signs and the nursing notes.  Pertinent labs & imaging results that were available during my care of the patient were reviewed by me and considered in my medical decision making (see chart for details).    Patient was given IV fluids and given IV insulin for her hyperglycemia. Lab work was done.  Reviewing patient's blood work she does not have DKA, her right carpus normal and her anion gap is normal.  Repeat CBG is improved after getting the IV fluids and IV insulin from 538 to 429. Her insulin was repeated IV and she was given another liter of fluid.  3:50 AM patient was then informed that her x-ray did not show pneumonia or rib fracture.we discussed all her test results and as soon as her sugar is in a more in control range she will be discharged. Patient is agreeable.  5 AM her CBG was 334. Patient states her CBGs can be variable in the morning. I gave her a subcutaneous dose in the ED. She can recheck her blood sugar in the morning and start on her usual insulin regimen. She was advised to watch what she is eating especially while she is on the steroids for this rash that she has, which is probably a contact dermatitis.   Final Clinical Impressions(s) / ED Diagnoses   Final diagnoses:  Subconjunctival bleed, left  Hyperglycemia  Contact dermatitis due to other agent, unspecified contact dermatitis type  Chest wall pain    Plan discharge  Devoria Albe, MD, Concha Pyo, MD 03/16/17 [phone]

## 2017-03-23 ENCOUNTER — Ambulatory Visit (HOSPITAL_COMMUNITY)
Admission: RE | Admit: 2017-03-23 | Discharge: 2017-03-23 | Disposition: A | Discharge status: Home / Self Care | Payer: Medicare Other | Source: Ambulatory Visit | Attending: Family Medicine | Admitting: Family Medicine

## 2017-03-23 ENCOUNTER — Other Ambulatory Visit (HOSPITAL_COMMUNITY): Payer: Self-pay | Admitting: Family Medicine

## 2017-03-23 DIAGNOSIS — M4317 Spondylolisthesis, lumbosacral region: Secondary | ICD-10-CM | POA: Diagnosis not present

## 2017-03-23 DIAGNOSIS — M549 Dorsalgia, unspecified: Secondary | ICD-10-CM | POA: Diagnosis not present

## 2017-03-24 ENCOUNTER — Emergency Department (HOSPITAL_COMMUNITY)
Admission: EM | Admit: 2017-03-24 | Discharge: 2017-03-25 | Disposition: A | Discharge status: Home / Self Care | Payer: Medicare Other | Attending: Emergency Medicine | Admitting: Emergency Medicine

## 2017-03-24 ENCOUNTER — Emergency Department (HOSPITAL_COMMUNITY): Payer: Medicare Other

## 2017-03-24 ENCOUNTER — Encounter (HOSPITAL_COMMUNITY): Payer: Self-pay

## 2017-03-24 DIAGNOSIS — I10 Essential (primary) hypertension: Secondary | ICD-10-CM | POA: Diagnosis not present

## 2017-03-24 DIAGNOSIS — F17210 Nicotine dependence, cigarettes, uncomplicated: Secondary | ICD-10-CM | POA: Diagnosis not present

## 2017-03-24 DIAGNOSIS — R072 Precordial pain: Secondary | ICD-10-CM | POA: Diagnosis not present

## 2017-03-24 DIAGNOSIS — E119 Type 2 diabetes mellitus without complications: Secondary | ICD-10-CM | POA: Insufficient documentation

## 2017-03-24 DIAGNOSIS — Z794 Long term (current) use of insulin: Secondary | ICD-10-CM | POA: Diagnosis not present

## 2017-03-24 DIAGNOSIS — E785 Hyperlipidemia, unspecified: Secondary | ICD-10-CM | POA: Diagnosis not present

## 2017-03-24 DIAGNOSIS — R079 Chest pain, unspecified: Secondary | ICD-10-CM | POA: Diagnosis present

## 2017-03-24 LAB — BASIC METABOLIC PANEL
Anion gap: 9 (ref 5–15)
BUN: 16 mg/dL (ref 6–20)
CO2: 23 mmol/L (ref 22–32)
Calcium: 8.4 mg/dL — ABNORMAL LOW (ref 8.9–10.3)
Chloride: 101 mmol/L (ref 101–111)
Creatinine, Ser: 0.92 mg/dL (ref 0.44–1.00)
GFR calc Af Amer: >60 mL/min (ref >60)
GFR calc non Af Amer: >60 mL/min (ref >60)
Glucose, Bld: 240 mg/dL — ABNORMAL HIGH (ref 65–99)
Potassium: 3.7 mmol/L (ref 3.5–5.1)
Sodium: 133 mmol/L — ABNORMAL LOW (ref 135–145)

## 2017-03-24 LAB — CBC
HCT: 34.8 % — ABNORMAL LOW (ref 36.0–46.0)
Hemoglobin: 11.9 g/dL — ABNORMAL LOW (ref 12.0–15.0)
MCH: 30.1 pg (ref 26.0–34.0)
MCHC: 34.2 g/dL (ref 30.0–36.0)
MCV: 88.1 fL (ref 78.0–100.0)
Platelets: 148 10*3/uL — ABNORMAL LOW (ref 150–400)
RBC: 3.95 MIL/uL (ref 3.87–5.11)
RDW: 13 % (ref 11.5–15.5)
WBC: 8 10*3/uL (ref 4.0–10.5)

## 2017-03-24 LAB — I-STAT TROPONIN, ED: Troponin i, poc: 0 ng/mL (ref 0.00–0.08)

## 2017-03-24 MED ORDER — HYDROCODONE-ACETAMINOPHEN 5-325 MG PO TABS
1.0000 | ORAL_TABLET | Freq: Once | ORAL | Status: AC
  Administered 2017-03-24: 1 via ORAL
  Filled 2017-03-24: qty 1

## 2017-03-24 MED ORDER — ASPIRIN 81 MG PO CHEW
324.0000 mg | CHEWABLE_TABLET | Freq: Once | ORAL | Status: AC
  Administered 2017-03-24: 324 mg via ORAL
  Filled 2017-03-24: qty 4

## 2017-03-24 NOTE — ED Notes (Signed)
EKG done and seen by Dr Long. 

## 2017-03-24 NOTE — ED Provider Notes (Signed)
Emergency Department Provider Note   I have reviewed the triage vital signs and the nursing notes.   HISTORY  Chief Complaint Chest Pain   HPI [name] is a 61 y.o. female with PMH of DM, tobacco use, HTN, HLD presents to the emergency room in for evaluation of sharp, intermittent, left-sided chest pain. The patient was seen by her primary care physician today for a regular checkup. She states later that afternoon she began experiencing the above described pain. She states that last for 1-2 minutes and then resolves without any particular intervention. She denies any exertional or pleuritic component. Denies radiation of symptoms. Denies fever, chills, productive cough. No chest wall injury. Pain is slightly worse with movement.   Past Medical History:  Diagnosis Date  . Arthritis    degenerative lumbar spine   . 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 Depression   . High cholesterol   . History of cerebral aneurysm repair   . Hypertension    states under control with med., has been on med. x "a Tyishia Aune time"  . Insulin dependent diabetes mellitus [facility])     Patient Active Problem List   Diagnosis Date Noted  . Neuralgia involving scalp 02/09/2016  . Cerebral aneurysm, nonruptured 02/15/2015  . Cerebral aneurysm 09/07/2014    Past Surgical History:  Procedure Laterality Date  . CARPAL TUNNEL RELEASE Right 03/25/2016   Procedure: RIGHT ENDOSCOPIC CARPAL TUNNEL RELEASE;  Surgeon: Mack Hook, MD;  Location: [facility];  Service: Orthopedics;  Laterality: Right;  . CARPAL TUNNEL RELEASE Left 04/08/2016   Procedure: LEFT CARPAL TUNNEL RELEASE ENDOSCOPIC;  Surgeon: Mack Hook, MD;  Location: [facility];  Service: Orthopedics;  Laterality: Left;  . CEREBRAL ANGIOGRAM  07/18/2014  . CRANIOTOMY Right 02/15/2015   Procedure: Right Frontotemporal Craniotomy for aneurysm clipping;  Surgeon: Lisbeth Renshaw, MD;  Location: [facility];  Service: Neurosurgery;  Laterality: Right;  right  . RADIOLOGY WITH ANESTHESIA N/A 09/07/2014   Procedure: Embolization Stent Supported Coil of Aneurysm;  Surgeon: Lisbeth Renshaw, MD;  Location: [facility];  Service: Radiology;  Laterality: N/A;  . TUBAL LIGATION    . UMBILICAL HERNIA REPAIR  05/14/1007    Current Outpatient Rx  . Order #: [id number] Class: Historical Med  . Order #: [id number] Class: Print  . Order #: [id number] Class: Print  . Order #: [id number] Class: Normal  . Order #: [id number] Class: Historical Med  . Order #: [id number] Class: Historical Med  . Order #: [id number] Class: Historical Med  . Order #: [id number] Class: Historical Med  . Order #: [id number] Class: Historical Med  . Order #: [id number] Class: Print  . Order #: [id number] Class: Historical Med  . Order #: [id number] Class: Historical Med  . Order #: [id number] Class: Print  . Order #: [id number] Class: Historical Med    Allergies Patient has no known allergies.  Family History  Problem Relation Age of Onset  . Cancer Father   . COPD Mother     Social History Social History  Substance Use Topics  . Smoking status: Current Every Day Smoker    Packs/day: 0.50    Years: 40.00    Types: Cigarettes  . Smokeless tobacco: Never Used  . Alcohol use No    Review of Systems  Constitutional: No fever/chills Eyes: No visual changes. ENT: No sore throat. Cardiovascular: Positive chest pain. Respiratory: Denies shortness of breath. Gastrointestinal: No abdominal pain.  No nausea, no vomiting.  No diarrhea.  No constipation. Genitourinary: Negative for dysuria.  Musculoskeletal: Negative for back pain. Skin: Negative for rash. Neurological: Negative for headaches, focal weakness or numbness.  10-point ROS otherwise negative.  ____________________________________________   PHYSICAL EXAM:  VITAL SIGNS: Vitals:   03/24/17 2347 03/25/17 0100  BP:  (!) 114/59  Pulse:  63  Resp:  12  Temp: 97.8 F (36.6 C)     SpO2:  94%     Constitutional: Alert and oriented. Well appearing and in no acute distress. Smells of tobacco smoke.  Eyes: Conjunctivae are normal.  Head: Atraumatic. Nose: No congestion/rhinnorhea. Mouth/Throat: Mucous membranes are moist.  Neck: No stridor.  Cardiovascular: Normal rate, regular rhythm. Good peripheral circulation. Grossly normal heart sounds.   Respiratory: Normal respiratory effort.  No retractions. Lungs with faint end-expiratory wheezing.  Gastrointestinal: Soft and nontender. No distention.  Musculoskeletal: No lower extremity tenderness nor edema. No gross deformities of extremities. Neurologic:  Normal speech and language. No gross focal neurologic deficits are appreciated.  Skin:  Skin is warm, dry and intact. No rash noted.  ____________________________________________   LABS (all labs ordered are listed, but only abnormal results are displayed)  Labs Reviewed  BASIC METABOLIC PANEL - Abnormal; Notable for the following:       Result Value   Sodium 133 (*)    Glucose, Bld 240 (*)    Calcium 8.4 (*)    All other components within normal limits  CBC - Abnormal; Notable for the following:    Hemoglobin 11.9 (*)    HCT 34.8 (*)    Platelets 148 (*)    All other components within normal limits  TROPONIN I  I-STAT TROPONIN, ED   ____________________________________________  EKG   EKG Interpretation  Date/Time:  Tuesday March 24 2017 21:43:23 EDT Ventricular Rate:  78 PR Interval:    QRS Duration: 95 QT Interval:  385 QTC Calculation: 439 R Axis:   16 Text Interpretation:  Sinus rhythm Low voltage, precordial leads No STEMI.  Confirmed by Alona Bene [phone]) on 03/24/2017 9:49:47 PM       ____________________________________________  RADIOLOGY  Dg Chest 2 View  Result Date: 03/24/2017 CLINICAL DATA:  LEFT chest pain.  History of hypertension, smoker. EXAM: CHEST  2 VIEW COMPARISON:  Chest radiograph March 16, 2017 FINDINGS:  Cardiomediastinal silhouette is normal. Mildly calcified aortic arch. Mild bronchitic changes. No pleural effusions or focal consolidations. Trachea projects midline and there is no pneumothorax. Soft tissue planes and included osseous structures are non-suspicious. IMPRESSION: Mild bronchitic changes. Aortic Atherosclerosis (ICD10-I70.0). Electronically Signed   By: Awilda Metro M.D.   On: 03/24/2017 22:27    ____________________________________________   PROCEDURES  Procedure(s) performed:   Procedures  None ____________________________________________   INITIAL IMPRESSION / ASSESSMENT AND PLAN / ED COURSE  Pertinent labs & imaging results that were available during my care of the patient were reviewed by me and considered in my medical decision making (see chart for details).  Patient resents emergency department for evaluation of atypical left-sided chest pain which is intermittent and started this afternoon. Patient does have tenderness to palpation over the left chest wall which she states feels like the pain she is having. Despite the atypical nature of the chest pain I remain concerned for ACS. Plan for ASA, CXR, and serial troponin. Low PE suspicion.   Differential includes all life-threatening causes for chest pain. This includes but is not exclusive to acute coronary syndrome, aortic dissection, pulmonary embolism, cardiac tamponade, community-acquired pneumonia, pericarditis, musculoskeletal chest wall pain, etc.   Initial troponin  negative. Updated patient who is pain-free. Care transferred to Dr. Bebe Shaggy who will follow repeat troponin. Anticipate discharge if negative for PCP follow up.   ____________________________________________  FINAL CLINICAL IMPRESSION(S) / ED DIAGNOSES  Final diagnoses:  Precordial pain     MEDICATIONS GIVEN DURING THIS VISIT:  Medications  aspirin chewable tablet 324 mg (324 mg Oral Given 03/24/17 2224)  HYDROcodone-acetaminophen  (NORCO/VICODIN) 5-325 MG per tablet 1 tablet (1 tablet Oral Given 03/24/17 2359)     NEW OUTPATIENT MEDICATIONS STARTED DURING THIS VISIT:  None   Note:  This document was prepared using Dragon voice recognition software and may include unintentional dictation errors.  Alona Bene, MD Emergency Medicine    Izaan Kingbird, Arlyss Repress, MD 03/25/17 [phone]

## 2017-03-24 NOTE — ED Triage Notes (Signed)
Pt comes in with chest pain to the left chest. States it feels uncomfortable and the pain comes and goes. Pain does not radiate. Rates pain 9/10. In NAD

## 2017-03-25 DIAGNOSIS — R072 Precordial pain: Secondary | ICD-10-CM | POA: Diagnosis not present

## 2017-03-25 LAB — TROPONIN I: Troponin I: <0.03 ng/mL (ref <0.03)

## 2017-03-25 NOTE — Discharge Instructions (Signed)

## 2017-03-25 NOTE — ED Provider Notes (Signed)
I assumed care at signout Repeat troponin negative I have viewed EKG Plan was to d/c home if repeat troponin negative This seems reasonable Advised patient to call PCP For close followup and can continue outpatient workup At this point, I have low suspicion for ACS/PE/Dissection We discussed return precautions    Zadie Rhine, MD 03/25/17 [phone]

## 2017-10-28 ENCOUNTER — Emergency Department (HOSPITAL_COMMUNITY)
Admission: EM | Admit: 2017-10-28 | Discharge: 2017-10-29 | Disposition: A | Discharge status: Home / Self Care | Payer: Medicare Other | Attending: Emergency Medicine | Admitting: Emergency Medicine

## 2017-10-28 DIAGNOSIS — Z794 Long term (current) use of insulin: Secondary | ICD-10-CM | POA: Diagnosis not present

## 2017-10-28 DIAGNOSIS — F17210 Nicotine dependence, cigarettes, uncomplicated: Secondary | ICD-10-CM | POA: Insufficient documentation

## 2017-10-28 DIAGNOSIS — M25571 Pain in right ankle and joints of right foot: Secondary | ICD-10-CM | POA: Diagnosis present

## 2017-10-28 DIAGNOSIS — E119 Type 2 diabetes mellitus without complications: Secondary | ICD-10-CM | POA: Insufficient documentation

## 2017-10-28 DIAGNOSIS — I10 Essential (primary) hypertension: Secondary | ICD-10-CM | POA: Diagnosis not present

## 2017-10-29 ENCOUNTER — Emergency Department (HOSPITAL_COMMUNITY): Payer: Medicare Other

## 2017-10-29 ENCOUNTER — Encounter (HOSPITAL_COMMUNITY): Payer: Self-pay

## 2017-10-29 LAB — CBG MONITORING, ED: Glucose-Capillary: 185 mg/dL — ABNORMAL HIGH (ref 65–99)

## 2017-10-29 MED ORDER — PREDNISONE 50 MG PO TABS: ORAL_TABLET | ORAL | 0 refills | Status: DC

## 2017-10-29 MED ORDER — HYDROCODONE-ACETAMINOPHEN 5-325 MG PO TABS
1.0000 | ORAL_TABLET | Freq: Once | ORAL | Status: AC
  Administered 2017-10-29: 1 via ORAL
  Filled 2017-10-29: qty 1

## 2017-10-29 MED ORDER — PREDNISONE 50 MG PO TABS
60.0000 mg | ORAL_TABLET | Freq: Once | ORAL | Status: AC
  Administered 2017-10-29: 60 mg via ORAL
  Filled 2017-10-29: qty 1

## 2017-10-29 NOTE — ED Notes (Signed)
Patient transported to X-ray 

## 2017-10-29 NOTE — ED Notes (Signed)
ED Provider at bedside. 

## 2017-10-29 NOTE — ED Provider Notes (Signed)
[facility] EMERGENCY DEPARTMENT Provider Note   CSN: [id number] Arrival date & time: 10/28/17  2348     History   Chief Complaint Chief Complaint  Patient presents with  . Ankle Pain    Pt seen with NP student, I performed history/physical/documentation   HPI [name] is a 62 y.o. female.  The history is provided by the patient.  Ankle Pain   The incident occurred more than 2 days ago. The incident occurred at home. There was no injury mechanism. The pain is present in the right ankle. The pain is moderate. The pain has been constant since onset. The symptoms are aggravated by bearing weight. Treatments tried: soaked in water. The treatment provided no relief.  Patient presents for right ankle pain.  No trauma or fall. Reports mild swelling to right ankle. She is able to ambulate She has never had this before.  No fever/vomiting/chest pain/shortness of breath No history of DVT. Past Medical History:  Diagnosis Date  . Arthritis    degenerative lumbar spine   . 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 Depression   . High cholesterol   . History of cerebral aneurysm repair   . Hypertension    states under control with med., has been on med. x "a long time"  . Insulin dependent diabetes mellitus [facility])     Patient Active Problem List   Diagnosis Date Noted  . Neuralgia involving scalp 02/09/2016  . Cerebral aneurysm, nonruptured 02/15/2015  . Cerebral aneurysm 09/07/2014    Past Surgical History:  Procedure Laterality Date  . CARPAL TUNNEL RELEASE Right 03/25/2016   Procedure: RIGHT ENDOSCOPIC CARPAL TUNNEL RELEASE;  Surgeon: Mack Hook, MD;  Location: [facility];  Service: Orthopedics;  Laterality: Right;  . CARPAL TUNNEL RELEASE Left 04/08/2016   Procedure: LEFT CARPAL TUNNEL RELEASE ENDOSCOPIC;  Surgeon: Mack Hook, MD;  Location: [facility];  Service: Orthopedics;  Laterality: Left;  . CEREBRAL ANGIOGRAM   07/18/2014  . CRANIOTOMY Right 02/15/2015   Procedure: Right Frontotemporal Craniotomy for aneurysm clipping;  Surgeon: Lisbeth Renshaw, MD;  Location: [facility];  Service: Neurosurgery;  Laterality: Right;  right  . RADIOLOGY WITH ANESTHESIA N/A 09/07/2014   Procedure: Embolization Stent Supported Coil of Aneurysm;  Surgeon: Lisbeth Renshaw, MD;  Location: [facility] OR;  Service: Radiology;  Laterality: N/A;  . TUBAL LIGATION    . UMBILICAL HERNIA REPAIR  05/14/1007     OB History    Gravida  2   Para  2   Term  2   Preterm      AB      Living        SAB      TAB      Ectopic      Multiple      Live Births               Home Medications    Prior to Admission medications   Medication Sig Start Date End Date Taking? Authorizing Provider  atorvastatin (LIPITOR) 20 MG tablet Take 20 mg by mouth daily.    [provider]  diphenhydrAMINE (BENADRYL) 25 mg capsule Take 1 capsule (25 mg total) by mouth every 6 (six) hours as needed. 03/15/17   Elpidio Anis, PA-C  famotidine (PEPCID) 20 MG tablet Take 1 tablet (20 mg total) by mouth 2 (two) times daily. 03/15/17   Elpidio Anis, PA-C  gabapentin (NEURONTIN) 300 MG capsule Take 1 capsule (  300 mg total) by mouth 3 (three) times daily. 02/12/16   Anson Fret, MD  HYDROcodone-acetaminophen (NORCO/VICODIN) 5-325 MG per tablet Take 1 tablet by mouth every 6 (six) hours as needed for moderate pain.    [provider]  insulin aspart (NOVOLOG) 100 UNIT/ML FlexPen Inject 8 Units into the skin 4 (four) times daily.    [provider]  Insulin Glargine (LANTUS) 100 UNIT/ML Solostar Pen Inject 20 Units into the skin at bedtime.     [provider]  metFORMIN (GLUCOPHAGE) 500 MG tablet Take 500 mg by mouth 2 (two) times daily with a meal.     [provider]  olmesartan-hydrochlorothiazide (BENICAR HCT) 20-12.5 MG per tablet Take 1 tablet by mouth daily.    [provider]    oxyCODONE (ROXICODONE) 5 MG immediate release tablet Take 1 tablet (5 mg total) by mouth every 6 (six) hours as needed for severe pain or breakthrough pain (to be used in addition to hydrocodone as needed). 04/08/16   Mack Hook, MD  predniSONE (DELTASONE) 20 MG tablet Take 3 tablets (60 mg total) by mouth daily. Patient not taking: Reported on 03/24/2017 03/15/17   Elpidio Anis, PA-C  rosuvastatin (CRESTOR) 20 MG tablet Take 20 mg by mouth daily.    [provider]  venlafaxine XR (EFFEXOR-XR) 75 MG 24 hr capsule Take 75 mg by mouth daily with breakfast.    [provider]  vitamin B-12 (CYANOCOBALAMIN) 100 MCG tablet Take 100 mcg by mouth daily.    [provider]    Family History Family History  Problem Relation Age of Onset  . Cancer Father   . COPD Mother     Social History Social History   Tobacco Use  . Smoking status: Current Every Day Smoker    Packs/day: 0.50    Years: 40.00    Pack years: 20.00    Types: Cigarettes  . Smokeless tobacco: Never Used  Substance Use Topics  . Alcohol use: No  . Drug use: No     Allergies   Patient has no known allergies.   Review of Systems Review of Systems  Constitutional: Negative for fever.  Respiratory: Negative for shortness of breath.   Cardiovascular: Negative for chest pain.  Gastrointestinal: Negative for vomiting.  Musculoskeletal: Positive for arthralgias and joint swelling.  All other systems reviewed and are negative.    Physical Exam Updated Vital Signs BP 110/73 (BP Location: Left Arm)   Pulse 73   Temp 98.3 F (36.8 C) (Oral)   Resp 18   Ht 1.549 m ( )   Wt 92.1 kg (203 lb)   SpO2 96%   BMI 38.36 kg/m   Physical Exam CONSTITUTIONAL: Well developed/well nourished, anxious HEAD: Normocephalic/atraumatic EYES: EOMI ENMT: Mucous membranes moist NECK: supple no meningeal signs CV: S1/S2 noted, no murmurs/rubs/gallops noted LUNGS: Lungs are clear to auscultation  bilaterally, no apparent distress ABDOMEN: soft GU:no cva tenderness NEURO: Pt is awake/alert/appropriate, moves all extremitiesx4.  No facial droop.   EXTREMITIES: pulses normal/equal, full ROM, mild tenderness and swelling to right ankle/lateral malleolus.  Minimal warmth noted no significant erythema.  No streaking.  No crepitus.  No calf tenderness.  Distal pulses intact. no  Lacerations.  No track marks SKIN: warm, color normal PSYCH: no abnormalities of mood noted, alert and oriented to situation  ED Treatments / Results  Labs (all labs ordered are listed, but only abnormal results are displayed) Labs Reviewed  CBG MONITORING, ED -  Abnormal; Notable for the following components:      Result Value   Glucose-Capillary 185 (*)    All other components within normal limits    EKG None  Radiology Dg Ankle Complete Right  Result Date: 10/29/2017 CLINICAL DATA:  62 year old female with right ankle pain. EXAM: RIGHT ANKLE - COMPLETE 3+ VIEW COMPARISON:  None. FINDINGS: There is no acute fracture or dislocation. No significant arthritic changes. The ankle mortise is intact. Mild soft tissue swelling over the lateral malleolus. IMPRESSION: No acute fracture or dislocation. Electronically Signed   By: Elgie Collard M.D.   On: 10/29/2017 02:10    Procedures Procedures (including critical care time)  Medications Ordered in ED Medications  HYDROcodone-acetaminophen (NORCO/VICODIN) 5-325 MG per tablet 1 tablet (1 tablet Oral Given 10/29/17 0057)  predniSONE (DELTASONE) tablet 60 mg (60 mg Oral Given 10/29/17 0200)     Initial Impression / Assessment and Plan / ED Course  I have reviewed the triage vital signs and the nursing notes.  Pertinent labs & imaging results that were available during my care of the patient were reviewed by me and considered in my medical decision making (see chart for details).     Well-appearing No signs of septic joint.  No signs of DVT. Suspicious for  gouty arthritis At this point, prednisone will be best option. Colchicine has interactions with her other medications.  She is not a candidate for NSAIDs. Strict monitoring of her glucose. Discussed f/u with PCP Narcotic database reviewed and considered in decision making  Final Clinical Impressions(s) / ED Diagnoses   Final diagnoses:  Acute right ankle pain    ED Discharge Orders        Ordered    predniSONE (DELTASONE) 50 MG tablet     10/29/17 0125       Zadie Rhine, MD 10/29/17 [phone]

## 2017-10-29 NOTE — ED Notes (Signed)
Pt returned from X Ray.

## 2017-10-29 NOTE — ED Triage Notes (Signed)
Pt reports pain and swelling to right ankle x 3 days, denies  Injury or trauma

## 2018-03-09 ENCOUNTER — Encounter (HOSPITAL_COMMUNITY): Payer: Self-pay | Admitting: Emergency Medicine

## 2018-03-09 ENCOUNTER — Emergency Department (HOSPITAL_COMMUNITY)
Admission: EM | Admit: 2018-03-09 | Discharge: 2018-03-09 | Disposition: A | Discharge status: Home / Self Care | Payer: Medicare Other | Attending: Emergency Medicine | Admitting: Emergency Medicine

## 2018-03-09 ENCOUNTER — Emergency Department (HOSPITAL_COMMUNITY): Payer: Medicare Other

## 2018-03-09 ENCOUNTER — Other Ambulatory Visit: Payer: Self-pay

## 2018-03-09 DIAGNOSIS — I10 Essential (primary) hypertension: Secondary | ICD-10-CM | POA: Diagnosis not present

## 2018-03-09 DIAGNOSIS — Z79899 Other long term (current) drug therapy: Secondary | ICD-10-CM | POA: Insufficient documentation

## 2018-03-09 DIAGNOSIS — R05 Cough: Secondary | ICD-10-CM | POA: Diagnosis present

## 2018-03-09 DIAGNOSIS — Z794 Long term (current) use of insulin: Secondary | ICD-10-CM | POA: Insufficient documentation

## 2018-03-09 DIAGNOSIS — J209 Acute bronchitis, unspecified: Secondary | ICD-10-CM | POA: Diagnosis not present

## 2018-03-09 DIAGNOSIS — E119 Type 2 diabetes mellitus without complications: Secondary | ICD-10-CM | POA: Diagnosis not present

## 2018-03-09 DIAGNOSIS — F17210 Nicotine dependence, cigarettes, uncomplicated: Secondary | ICD-10-CM | POA: Diagnosis not present

## 2018-03-09 LAB — CBG MONITORING, ED: Glucose-Capillary: 177 mg/dL — ABNORMAL HIGH (ref 70–99)

## 2018-03-09 MED ORDER — OXYMETAZOLINE HCL 0.05 % NA SOLN
1.0000 | Freq: Once | NASAL | Status: AC
  Administered 2018-03-09: 1 via NASAL
  Filled 2018-03-09: qty 15

## 2018-03-09 MED ORDER — DOXYCYCLINE HYCLATE 100 MG PO CAPS: 100.0000 mg | ORAL_CAPSULE | Freq: Two times a day (BID) | ORAL | 0 refills | Status: DC

## 2018-03-09 MED ORDER — DEXAMETHASONE SODIUM PHOSPHATE 10 MG/ML IJ SOLN
10.0000 mg | Freq: Once | INTRAMUSCULAR | Status: AC
  Administered 2018-03-09: 10 mg via INTRAMUSCULAR
  Filled 2018-03-09: qty 1

## 2018-03-09 MED ORDER — PROMETHAZINE-DM 6.25-15 MG/5ML PO SYRP: 5.0000 mL | ORAL_SOLUTION | ORAL | 0 refills | Status: DC | PRN

## 2018-03-09 MED ORDER — ALBUTEROL SULFATE HFA 108 (90 BASE) MCG/ACT IN AERS
2.0000 | INHALATION_SPRAY | Freq: Once | RESPIRATORY_TRACT | Status: AC
  Administered 2018-03-09: 2 via RESPIRATORY_TRACT
  Filled 2018-03-09: qty 6.7

## 2018-03-09 NOTE — Discharge Instructions (Addendum)
Your oxygen level is within normal limits.  Your x-ray shows bronchitis, but no mass, no fluid, no pneumonia.  Please increase fluids.  Please stop smoking.  Use 2 puffs of albuterol every 4 hours.  Use Promethazine DM every 4 hours as needed for cough.  Use a squirt of Afrin spray in each nostril every 12 hours for 5 days only.  Use doxycycline 2 times daily with a meal.  Please schedule an appointment to follow-up with Dr. Sudie BaileyKnowlton concerning your cough and bronchitis.

## 2018-03-09 NOTE — ED Provider Notes (Signed)
[facility] EMERGENCY DEPARTMENT Provider Note   CSN: [id number] Arrival date & time: 03/09/18  1327     History   Chief Complaint Chief Complaint  Patient presents with  . Cough    HPI [name] is a 62 y.o. female.  Patient is a 62 year old female who presents to the emergency department with complaint of cough, fever, and body aches.  The patient states this problem is been going on for the last 2 to 3 days.  She has a productive cough at times.  She has not had any blood in the sputum.  She says that she thinks she has had some fever, but she has not measured it at home.  She has had some body aches.  There is been no recent procedures or operations involving the lungs.  No recent injury to the chest or lung area.  It is of note that the patient is a diabetic.  It is of note that she is a smoker.  The history is provided by the patient.  Cough  Pertinent negatives include no chest pain, no shortness of breath and no wheezing.    Past Medical History:  Diagnosis Date  . Arthritis    degenerative lumbar spine   . 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 Depression   . High cholesterol   . History of cerebral aneurysm repair   . Hypertension    states under control with med., has been on med. x "a long time"  . Insulin dependent diabetes mellitus [facility])     Patient Active Problem List   Diagnosis Date Noted  . Neuralgia involving scalp 02/09/2016  . Cerebral aneurysm, nonruptured 02/15/2015  . Cerebral aneurysm 09/07/2014    Past Surgical History:  Procedure Laterality Date  . CARPAL TUNNEL RELEASE Right 03/25/2016   Procedure: RIGHT ENDOSCOPIC CARPAL TUNNEL RELEASE;  Surgeon: Mack Hook, MD;  Location: [facility];  Service: Orthopedics;  Laterality: Right;  . CARPAL TUNNEL RELEASE Left 04/08/2016   Procedure: LEFT CARPAL TUNNEL RELEASE ENDOSCOPIC;  Surgeon: Mack Hook, MD;  Location: [facility];  Service:  Orthopedics;  Laterality: Left;  . CEREBRAL ANGIOGRAM  07/18/2014  . CRANIOTOMY Right 02/15/2015   Procedure: Right Frontotemporal Craniotomy for aneurysm clipping;  Surgeon: Lisbeth Renshaw, MD;  Location: [facility];  Service: Neurosurgery;  Laterality: Right;  right  . RADIOLOGY WITH ANESTHESIA N/A 09/07/2014   Procedure: Embolization Stent Supported Coil of Aneurysm;  Surgeon: Lisbeth Renshaw, MD;  Location: [facility];  Service: Radiology;  Laterality: N/A;  . TUBAL LIGATION    . UMBILICAL HERNIA REPAIR  05/14/1007     OB History    Gravida  2   Para  2   Term  2   Preterm      AB      Living        SAB      TAB      Ectopic      Multiple      Live Births               Home Medications    Prior to Admission medications   Medication Sig Start Date End Date Taking? Authorizing Provider  atorvastatin (LIPITOR) 20 MG tablet Take 20 mg by mouth daily.    [provider]  diphenhydrAMINE (BENADRYL) 25 mg capsule Take 1 capsule (25 mg total) by mouth every 6 (six) hours as needed. 03/15/17   Elpidio Anis,  PA-C  famotidine (PEPCID) 20 MG tablet Take 1 tablet (20 mg total) by mouth 2 (two) times daily. 03/15/17   Elpidio Anis, PA-C  gabapentin (NEURONTIN) 300 MG capsule Take 1 capsule (300 mg total) by mouth 3 (three) times daily. 02/12/16   Anson Fret, MD  HYDROcodone-acetaminophen (NORCO/VICODIN) 5-325 MG per tablet Take 1 tablet by mouth every 6 (six) hours as needed for moderate pain.    [provider]  insulin aspart (NOVOLOG) 100 UNIT/ML FlexPen Inject 8 Units into the skin 4 (four) times daily.    [provider]  Insulin Glargine (LANTUS) 100 UNIT/ML Solostar Pen Inject 20 Units into the skin at bedtime.     [provider]  metFORMIN (GLUCOPHAGE) 500 MG tablet Take 500 mg by mouth 2 (two) times daily with a meal.     [provider]  olmesartan-hydrochlorothiazide (BENICAR HCT) 20-12.5 MG per tablet Take 1  tablet by mouth daily.    [provider]  oxyCODONE (ROXICODONE) 5 MG immediate release tablet Take 1 tablet (5 mg total) by mouth every 6 (six) hours as needed for severe pain or breakthrough pain (to be used in addition to hydrocodone as needed). 04/08/16   Mack Hook, MD  predniSONE (DELTASONE) 50 MG tablet One tablet PO daily for 4 days Be sure to check sugar frequently 10/29/17   Zadie Rhine, MD  rosuvastatin (CRESTOR) 20 MG tablet Take 20 mg by mouth daily.    [provider]  venlafaxine XR (EFFEXOR-XR) 75 MG 24 hr capsule Take 75 mg by mouth daily with breakfast.    [provider]  vitamin B-12 (CYANOCOBALAMIN) 100 MCG tablet Take 100 mcg by mouth daily.    [provider]    Family History Family History  Problem Relation Age of Onset  . Cancer Father   . COPD Mother     Social History Social History   Tobacco Use  . Smoking status: Current Every Day Smoker    Packs/day: 0.50    Years: 40.00    Pack years: 20.00    Types: Cigarettes  . Smokeless tobacco: Never Used  Substance Use Topics  . Alcohol use: No  . Drug use: No     Allergies   Patient has no known allergies.   Review of Systems Review of Systems  Constitutional: Negative for activity change.       All ROS Neg except as noted in HPI  HENT: Negative for nosebleeds.   Eyes: Negative for photophobia and discharge.  Respiratory: Positive for cough. Negative for shortness of breath and wheezing.   Cardiovascular: Negative for chest pain and palpitations.  Gastrointestinal: Negative for abdominal pain and blood in stool.  Genitourinary: Negative for dysuria, frequency and hematuria.  Musculoskeletal: Negative for arthralgias, back pain and neck pain.  Skin: Negative.   Neurological: Negative for dizziness, seizures and speech difficulty.  Psychiatric/Behavioral: Negative for confusion and hallucinations.     Physical Exam Updated Vital Signs BP (!) 143/71  (BP Location: Right Arm)   Pulse 82   Temp (!) 97.4 F (36.3 C) (Oral)   Resp 18   Ht 5\' 2"  (1.575 m)   Wt 92.5 kg   SpO2 98%   BMI 37.31 kg/m   Physical Exam  Constitutional: She is oriented to person, place, and time. She appears well-developed and well-nourished.  Non-toxic appearance.  HENT:  Head: Normocephalic.  Right Ear: Tympanic membrane and external ear normal.  Left Ear: Tympanic membrane and external  ear normal.  Nasal congestion present.  The oropharynx is clear.  Eyes: Pupils are equal, round, and reactive to light. EOM and lids are normal.  Neck: Normal range of motion. Neck supple. Carotid bruit is not present.  Cardiovascular: Normal rate, regular rhythm, normal heart sounds, intact distal pulses and normal pulses.  Pulmonary/Chest: Breath sounds normal. No respiratory distress.  There is symmetrical rise and fall of the chest.  The patient speaks in complete sentences without problem.  There are bilateral rhonchi present.  Coarse breath sounds also noted.  Abdominal: Soft. Bowel sounds are normal. There is no tenderness. There is no guarding.  Musculoskeletal: Normal range of motion.  No edema of the lower extremities.  Negative Homans sign.  Lymphadenopathy:       Head (right side): No submandibular adenopathy present.       Head (left side): No submandibular adenopathy present.    She has no cervical adenopathy.  Neurological: She is alert and oriented to person, place, and time. She has normal strength. No cranial nerve deficit or sensory deficit.  Skin: Skin is warm and dry.  Psychiatric: She has a normal mood and affect. Her speech is normal.  Nursing note and vitals reviewed.    ED Treatments / Results  Labs (all labs ordered are listed, but only abnormal results are displayed) Labs Reviewed  CBG MONITORING, ED - Abnormal; Notable for the following components:      Result Value   Glucose-Capillary 177 (*)    All other components within normal limits     EKG None  Radiology Dg Chest 2 View  Result Date: 03/09/2018 CLINICAL DATA:  Cough.  Weakness. EXAM: CHEST - 2 VIEW COMPARISON:  03/24/2017. FINDINGS: Mediastinum and hilar structures normal. Heart size normal. Mild bibasilar atelectasis. No pleural effusion or pneumothorax. Degenerative changes thoracic spine. IMPRESSION: Mild bibasilar atelectasis.  Exam is otherwise unremarkable. Electronically Signed   By: Maisie Fushomas  Register   On: 03/09/2018 13:59    Procedures Procedures (including critical care time)  Medications Ordered in ED Medications - No data to display   Initial Impression / Assessment and Plan / ED Course  I have reviewed the triage vital signs and the nursing notes.  Pertinent labs & imaging results that were available during my care of the patient were reviewed by me and considered in my medical decision making (see chart for details).       Final Clinical Impressions(s) / ED Diagnoses MDM  Vital signs reviewed.  Pulse oximetry is 98% on room air.  Within normal limits by my interpretation.  The patient speaks in complete sentences without problem.  There is symmetrical rise and fall of the chest.  The chest x-ray shows some atelectasis, and some bronchitis changes, but no pneumonia or other acute changes.  The glucose is 177.  The patient is provided with an albuterol inhaler.  Prescription for doxycycline and Promethazine DM for cough given to the patient.  The patient will use 2 puffs of albuterol every 4 hours.  The patient will use doxycycline 2 times daily. Patient advised to follow-up with Dr. Sudie BaileyKnowlton in the office for follow-up and recheck.  Patient is in agreement with this plan.   Final diagnoses:  Acute bronchitis, unspecified organism    ED Discharge Orders         Ordered    doxycycline (VIBRAMYCIN) 100 MG capsule  2 times daily     03/09/18 1441    promethazine-dextromethorphan (PROMETHAZINE-DM) 6.25-15 MG/5ML syrup  Every 4 hours PRN      03/09/18 1442           Ivery Quale, PA-C 03/09/18 1453    Vanetta Mulders, MD 03/10/18 [phone]

## 2018-03-09 NOTE — ED Notes (Signed)
Pt returned from xray

## 2018-03-09 NOTE — ED Triage Notes (Signed)
Pt c/o of productive cough, chills, fever, and body aches x 2 days.

## 2018-03-09 NOTE — ED Notes (Signed)
Patient transported to X-ray 

## 2018-09-21 ENCOUNTER — Encounter: Payer: Self-pay | Admitting: Adult Health

## 2018-09-22 ENCOUNTER — Encounter: Payer: Self-pay | Admitting: Internal Medicine

## 2018-12-14 ENCOUNTER — Other Ambulatory Visit: Payer: Medicare Other | Admitting: Adult Health

## 2018-12-22 ENCOUNTER — Encounter: Payer: Self-pay | Admitting: *Deleted

## 2018-12-22 ENCOUNTER — Other Ambulatory Visit: Payer: Self-pay

## 2018-12-22 ENCOUNTER — Ambulatory Visit (INDEPENDENT_AMBULATORY_CARE_PROVIDER_SITE_OTHER): Payer: Medicare HMO | Admitting: Gastroenterology

## 2018-12-22 ENCOUNTER — Encounter: Payer: Self-pay | Admitting: Gastroenterology

## 2018-12-22 ENCOUNTER — Other Ambulatory Visit: Payer: Self-pay | Admitting: *Deleted

## 2018-12-22 VITALS — BP 126/78 | HR 80 | Temp 97.5°F | Ht 62.0 in | Wt 197.6 lb

## 2018-12-22 DIAGNOSIS — R1900 Intra-abdominal and pelvic swelling, mass and lump, unspecified site: Secondary | ICD-10-CM

## 2018-12-22 DIAGNOSIS — Z1211 Encounter for screening for malignant neoplasm of colon: Secondary | ICD-10-CM | POA: Diagnosis not present

## 2018-12-22 MED ORDER — PEG 3350-KCL-NA BICARB-NACL 420 G PO SOLR: 4000.0000 mL | Freq: Once | ORAL | 0 refills | Status: AC

## 2018-12-22 NOTE — Progress Notes (Signed)
Primary Care Physician:  Pearson GrippeKim, James, MD Primary Gastroenterologist:  Dr. Jena Gaussourk   Chief Complaint  Patient presents with  . Colonoscopy    never had tcs    HPI:   [name] is a 63 y.o. female presenting today at the request of [facility] to schedule initial screening colonoscopy.   No abdominal pain. No N/V. No GERD symptoms. No dysphagia. No rectal bleeding. No appetite changes or unexplained weight loss. No family history of colorectal cancer or polyps.     Past Medical History:  Diagnosis Date  . Arthritis    degenerative lumbar spine   . 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 Depression   . High cholesterol   . History of cerebral aneurysm repair   . Hypertension    states under control with med., has been on med. x "a long time"  . Insulin dependent diabetes mellitus (HCC)     Past Surgical History:  Procedure Laterality Date  . CARPAL TUNNEL RELEASE Right 03/25/2016   Procedure: RIGHT ENDOSCOPIC CARPAL TUNNEL RELEASE;  Surgeon: Mack Hookavid Thompson, MD;  Location: [facility];  Service: Orthopedics;  Laterality: Right;  . CARPAL TUNNEL RELEASE Left 04/08/2016   Procedure: LEFT CARPAL TUNNEL RELEASE ENDOSCOPIC;  Surgeon: Mack Hookavid Thompson, MD;  Location: [facility];  Service: Orthopedics;  Laterality: Left;  . CEREBRAL ANGIOGRAM  07/18/2014  . CRANIOTOMY Right 02/15/2015   Procedure: Right Frontotemporal Craniotomy for aneurysm clipping;  Surgeon: Lisbeth RenshawNeelesh Nundkumar, MD;  Location: [facility];  Service: Neurosurgery;  Laterality: Right;  right  . RADIOLOGY WITH ANESTHESIA N/A 09/07/2014   Procedure: Embolization Stent Supported Coil of Aneurysm;  Surgeon: Lisbeth RenshawNeelesh Nundkumar, MD;  [facility];  Service: Radiology;  Laterality: N/A;  . TUBAL LIGATION    . UMBILICAL HERNIA REPAIR  05/14/1007    Current Outpatient Medications  Medication Sig Dispense Refill  . atorvastatin (LIPITOR) 20 MG tablet Take 20 mg by mouth daily.     . Cyanocobalamin (VITAMIN B-12 IJ) Inject as directed every 30 (thirty) days.    . diphenhydrAMINE (BENADRYL) 25 mg capsule Take 1 capsule (25 mg total) by mouth every 6 (six) hours as needed. 30 capsule 0  . famotidine (PEPCID) 20 MG tablet Take 1 tablet (20 mg total) by mouth 2 (two) times daily. 30 tablet 0  . gabapentin (NEURONTIN) 300 MG capsule Take 1 capsule (300 mg total) by mouth 3 (three) times daily. 90 capsule 2  . HYDROcodone-acetaminophen (NORCO/VICODIN) 5-325 MG per tablet Take 1 tablet by mouth every 6 (six) hours as needed for moderate pain.    Marland Kitchen. insulin aspart (NOVOLOG) 100 UNIT/ML FlexPen Inject 8 Units into the skin 4 (four) times daily.    . Insulin Glargine (LANTUS) 100 UNIT/ML Solostar Pen Inject 20 Units into the skin at bedtime.     . metFORMIN (GLUCOPHAGE) 500 MG tablet Take 500 mg by mouth 2 (two) times daily with a meal.     . olmesartan-hydrochlorothiazide (BENICAR HCT) 20-12.5 MG per tablet Take 1 tablet by mouth daily.    . rosuvastatin (CRESTOR) 20 MG tablet Take 20 mg by mouth daily.    Marland Kitchen. venlafaxine XR (EFFEXOR-XR) 75 MG 24 hr capsule Take 75 mg by mouth daily with breakfast.     No current facility-administered medications for this visit.     Allergies as of 12/22/2018  . (No Known Allergies)    Family History  Problem Relation Age of Onset  .  Cancer Father   . COPD Mother   . Colon cancer Neg Hx   . Colon polyps Neg Hx     Social History   Socioeconomic History  . Marital status: Single    Spouse name: Not on file  . Number of children: 2  . Years of education: 52  . Highest education level: Not on file  Occupational History  . Occupation: Unemployed  Social Needs  . Financial resource strain: Not on file  . Food insecurity    Worry: Not on file    Inability: Not on file  . Transportation needs    Medical: Not on file    Non-medical: Not on file  Tobacco Use  . Smoking status: Current Every Day Smoker    Packs/day: 0.50    Years:  40.00    Pack years: 20.00    Types: Cigarettes  . Smokeless tobacco: Never Used  Substance and Sexual Activity  . Alcohol use: No  . Drug use: No  . Sexual activity: Not on file  Lifestyle  . Physical activity    Days per week: Not on file    Minutes per session: Not on file  . Stress: Not on file  Relationships  . Social Herbalist on phone: Not on file    Gets together: Not on file    Attends religious service: Not on file    Active member of club or organization: Not on file    Attends meetings of clubs or organizations: Not on file    Relationship status: Not on file  . Intimate partner violence    Fear of current or ex partner: Not on file    Emotionally abused: Not on file    Physically abused: Not on file    Forced sexual activity: Not on file  Other Topics Concern  . Not on file  Social History Narrative   Lives with boyfriend.   Caffeine use: Drinks coffee/tea (not often per patient)    Review of Systems: Gen: Denies any fever, chills, fatigue, weight loss, lack of appetite.  CV: Denies chest pain, heart palpitations, peripheral edema, syncope.  Resp: Denies shortness of breath at rest or with exertion. Denies wheezing or cough.  GI:see HPI GU : Denies urinary burning, urinary frequency, urinary hesitancy MS: Denies joint pain, muscle weakness, cramps, or limitation of movement.  Derm: Denies rash, itching, dry skin Psych: Denies depression, anxiety, memory loss, and confusion Heme: Denies bruising, bleeding, and enlarged lymph nodes.  Physical Exam: BP 126/78   Pulse 80   Temp (!) 97.5 F (36.4 C) (Oral)   Ht 5\' 2"  (1.575 m)   Wt 197 lb 9.6 oz (89.6 kg)   BMI 36.14 kg/m  General:   Alert and oriented. Pleasant and cooperative. Well-nourished and well-developed.  Head:  Normocephalic and atraumatic. Eyes:  Without icterus, sclera clear and conjunctiva pink.  Ears:  Normal auditory acuity. Nose:  No deformity, discharge,  or lesions. Mouth:   No deformity or lesions, oral mucosa pink.  Lungs:  Clear to auscultation bilaterally. No wheezes, rales, or rhonchi. No distress.  Heart:  S1, S2 present without murmurs appreciated.  Abdomen:  +BS, soft, non-tender and non-distended. No HSM noted. No guarding or rebound. Just superior to umbilicus is a firm, non-fixated, slightly larger than golf ball shape structure that may represent lipoma Rectal:  Deferred  Msk:  Symmetrical without gross deformities. Normal posture. Extremities:  Without  edema. Neurologic:  Alert and  oriented x4; Psych:  Alert and cooperative. Normal mood and affect.

## 2018-12-22 NOTE — Patient Instructions (Addendum)
I have ordered an ultrasound to check the bump in your belly. This feels like possibly a lipoma, which is benign.   We have arranged a colonoscopy in the near future with Dr. Gala Romney!  You will take only 1/2 Lantus dosage the evening before the procedure, and no metformin the day of the procedure.   It was a pleasure to see you today. I strive to create trusting relationships with patients to provide genuine, compassionate, and quality care. I value your feedback. If you receive a survey regarding your visit,  I greatly appreciate you taking time to fill this out.   Annitta Needs, PhD, ANP-BC [facility]

## 2018-12-22 NOTE — Assessment & Plan Note (Signed)
Query lipoma at umbilicus. Ultrasound ordered.

## 2018-12-22 NOTE — Progress Notes (Signed)
CC'D TO PCP °

## 2018-12-22 NOTE — Assessment & Plan Note (Signed)
63 year old female with need for initial screening colonoscopy, overdue. No concerning lower or upper GI signs/symptoms, and no family history of colorectal cancer or polyps.  Proceed with TCS with Dr. Gala Romney in near future: the risks, benefits, and alternatives have been discussed with the patient in detail. The patient states understanding and desires to proceed. Propofol due to polypharmacy

## 2018-12-27 ENCOUNTER — Encounter: Payer: Self-pay | Admitting: *Deleted

## 2018-12-29 ENCOUNTER — Ambulatory Visit (HOSPITAL_COMMUNITY)
Admission: RE | Admit: 2018-12-29 | Discharge: 2018-12-29 | Disposition: A | Discharge status: Home / Self Care | Payer: Medicare HMO | Source: Ambulatory Visit | Attending: Gastroenterology | Admitting: Gastroenterology

## 2018-12-29 ENCOUNTER — Other Ambulatory Visit: Payer: Self-pay

## 2018-12-29 DIAGNOSIS — R1900 Intra-abdominal and pelvic swelling, mass and lump, unspecified site: Secondary | ICD-10-CM | POA: Diagnosis not present

## 2018-12-29 NOTE — Progress Notes (Signed)
Please let patient know the area she feels in her abdomen may be extended through muscle and could be a hernia. Would recommend CT abd/pelvis with contrast. Hold metformin day of CT and for 48 hours post-CT.

## 2018-12-31 ENCOUNTER — Other Ambulatory Visit: Payer: Self-pay

## 2018-12-31 DIAGNOSIS — R1900 Intra-abdominal and pelvic swelling, mass and lump, unspecified site: Secondary | ICD-10-CM

## 2019-01-04 ENCOUNTER — Telehealth: Payer: Self-pay

## 2019-01-04 NOTE — Telephone Encounter (Signed)
PA for CT abd/pelvis w/contrast submitted via [facility]. Case pending for clinical review. Service order: [id number]. Clinical notes faxed to [place],

## 2019-01-07 ENCOUNTER — Encounter (HOSPITAL_COMMUNITY): Payer: Self-pay | Admitting: *Deleted

## 2019-01-07 ENCOUNTER — Other Ambulatory Visit: Payer: Self-pay

## 2019-01-07 ENCOUNTER — Emergency Department (HOSPITAL_COMMUNITY)
Admission: EM | Admit: 2019-01-07 | Discharge: 2019-01-07 | Disposition: A | Discharge status: Home / Self Care | Payer: Medicare HMO | Attending: Emergency Medicine | Admitting: Emergency Medicine

## 2019-01-07 DIAGNOSIS — F17210 Nicotine dependence, cigarettes, uncomplicated: Secondary | ICD-10-CM | POA: Insufficient documentation

## 2019-01-07 DIAGNOSIS — Z794 Long term (current) use of insulin: Secondary | ICD-10-CM | POA: Diagnosis not present

## 2019-01-07 DIAGNOSIS — Y929 Unspecified place or not applicable: Secondary | ICD-10-CM | POA: Insufficient documentation

## 2019-01-07 DIAGNOSIS — W57XXXA Bitten or stung by nonvenomous insect and other nonvenomous arthropods, initial encounter: Secondary | ICD-10-CM | POA: Insufficient documentation

## 2019-01-07 DIAGNOSIS — S80861A Insect bite (nonvenomous), right lower leg, initial encounter: Secondary | ICD-10-CM | POA: Diagnosis not present

## 2019-01-07 DIAGNOSIS — Y9389 Activity, other specified: Secondary | ICD-10-CM | POA: Diagnosis not present

## 2019-01-07 DIAGNOSIS — Y998 Other external cause status: Secondary | ICD-10-CM | POA: Diagnosis not present

## 2019-01-07 DIAGNOSIS — I10 Essential (primary) hypertension: Secondary | ICD-10-CM | POA: Insufficient documentation

## 2019-01-07 DIAGNOSIS — Z79899 Other long term (current) drug therapy: Secondary | ICD-10-CM | POA: Insufficient documentation

## 2019-01-07 DIAGNOSIS — R21 Rash and other nonspecific skin eruption: Secondary | ICD-10-CM | POA: Insufficient documentation

## 2019-01-07 DIAGNOSIS — E119 Type 2 diabetes mellitus without complications: Secondary | ICD-10-CM | POA: Insufficient documentation

## 2019-01-07 LAB — CBG MONITORING, ED: Glucose-Capillary: 234 mg/dL — ABNORMAL HIGH (ref 70–99)

## 2019-01-07 MED ORDER — HYDROXYZINE HCL 25 MG PO TABS: 25.0000 mg | ORAL_TABLET | Freq: Four times a day (QID) | ORAL | 0 refills | Status: DC

## 2019-01-07 MED ORDER — HYDROXYZINE HCL 25 MG PO TABS
25.0000 mg | ORAL_TABLET | Freq: Once | ORAL | Status: AC
  Administered 2019-01-07: 25 mg via ORAL
  Filled 2019-01-07: qty 1

## 2019-01-07 MED ORDER — TRIAMCINOLONE ACETONIDE 0.5 % EX OINT: 1.0000 "application " | TOPICAL_OINTMENT | Freq: Two times a day (BID) | CUTANEOUS | 0 refills | Status: DC

## 2019-01-07 NOTE — ED Provider Notes (Signed)
[facility]ERGENCY DEPARTMENT Provider Note   CSN: [id number] Arrival date & time: 01/07/19  1132    History   Chief Complaint Chief Complaint  Patient presents with  . Allergic Reaction    HPI Campbell Richesancy J Loma is a 63 y.o. female.     HPI  63 year old female, she has a known history of a couple of different medical problems but presents today with a separate issue of having several days including about a day and a half of right leg rash.  She has several well-circumscribed itchy red areas involving her lateral posterior thigh and her calf.  They are very discrete, she has no fevers or any other symptoms, she has been using calamine lotion with only minimal relief.  She is very itchy.  She does not recall any specific injuries exposures or bug bites.  Symptoms are persistent, mild to moderate, no issues with airway or other allergic reaction symptoms.  Past Medical History:  Diagnosis Date  . Arthritis    degenerative lumbar spine   . 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 Depression   . High cholesterol   . History of cerebral aneurysm repair   . Hypertension    states under control with med., has been on med. x "a long time"  . Insulin dependent diabetes mellitus [facility](HCC)     Patient Active Problem List   Diagnosis Date Noted  . Encounter for screening colonoscopy 12/22/2018  . Abdominal mass 12/22/2018  . Neuralgia involving scalp 02/09/2016  . Cerebral aneurysm, nonruptured 02/15/2015  . Cerebral aneurysm 09/07/2014    Past Surgical History:  Procedure Laterality Date  . CARPAL TUNNEL RELEASE Right 03/25/2016   Procedure: RIGHT ENDOSCOPIC CARPAL TUNNEL RELEASE;  Surgeon: Mack Hookavid Thompson, MD;  Location: [facility];  Service: Orthopedics;  Laterality: Right;  . CARPAL TUNNEL RELEASE Left 04/08/2016   Procedure: LEFT CARPAL TUNNEL RELEASE ENDOSCOPIC;  Surgeon: Mack Hookavid Thompson, MD;  Location: [facility];  Service: Orthopedics;   Laterality: Left;  . CEREBRAL ANGIOGRAM  07/18/2014  . CRANIOTOMY Right 02/15/2015   Procedure: Right Frontotemporal Craniotomy for aneurysm clipping;  Surgeon: Lisbeth RenshawNeelesh Nundkumar, MD;  Location: [facility];  Service: Neurosurgery;  Laterality: Right;  right  . RADIOLOGY WITH ANESTHESIA N/A 09/07/2014   Procedure: Embolization Stent Supported Coil of Aneurysm;  Surgeon: Lisbeth RenshawNeelesh Nundkumar, MD;  [facility];  Service: Radiology;  Laterality: N/A;  . TUBAL LIGATION    . UMBILICAL HERNIA REPAIR  05/14/1007     OB History    Gravida  2   Para  2   Term  2   Preterm      AB      Living        SAB      TAB      Ectopic      Multiple      Live Births               Home Medications    Prior to Admission medications   Medication Sig Start Date End Date Taking? Authorizing Provider  atorvastatin (LIPITOR) 20 MG tablet Take 20 mg by mouth daily.    [provider]  Cyanocobalamin (VITAMIN B-12 IJ) Inject as directed every 30 (thirty) days.    [provider]  diphenhydrAMINE (BENADRYL) 25 mg capsule Take 1 capsule (25 mg total) by mouth every 6 (six) hours as needed. 03/15/17   Elpidio AnisUpstill, Shari, PA-C  famotidine (PEPCID) 20 MG  tablet Take 1 tablet (20 mg total) by mouth 2 (two) times daily. 03/15/17   Charlann Lange, PA-C  gabapentin (NEURONTIN) 300 MG capsule Take 1 capsule (300 mg total) by mouth 3 (three) times daily. 02/12/16   Melvenia Beam, MD  hydrochlorothiazide (MICROZIDE) 12.5 MG capsule Take 1 capsule by mouth daily. 12/30/18   [provider]  HYDROcodone-acetaminophen (NORCO/VICODIN) 5-325 MG per tablet Take 1 tablet by mouth every 6 (six) hours as needed for moderate pain.    [provider]  hydrOXYzine (ATARAX/VISTARIL) 25 MG tablet Take 1 tablet (25 mg total) by mouth every 6 (six) hours. 01/07/19   Noemi Chapel, MD  insulin aspart (NOVOLOG) 100 UNIT/ML FlexPen Inject 8 Units into the skin 4 (four) times daily.    [provider]  Insulin Glargine (LANTUS) 100 UNIT/ML Solostar Pen Inject 20 Units into the skin at bedtime.     [provider]  losartan (COZAAR) 100 MG tablet Take 1 tablet by mouth daily. 12/30/18   [provider]  metFORMIN (GLUCOPHAGE) 500 MG tablet Take 500 mg by mouth 2 (two) times daily with a meal.     [provider]  olmesartan-hydrochlorothiazide (BENICAR HCT) 20-12.5 MG per tablet Take 1 tablet by mouth daily.    [provider]  rosuvastatin (CRESTOR) 20 MG tablet Take 20 mg by mouth daily.    [provider]  traZODone (DESYREL) 100 MG tablet Take 1 tablet by mouth at bedtime. 12/30/18   [provider]  triamcinolone ointment (KENALOG) 0.5 % Apply 1 application topically 2 (two) times daily. 01/07/19   Noemi Chapel, MD  valACYclovir (VALTREX) 1000 MG tablet Take 1 tablet by mouth 2 (two) times daily as needed. 12/30/18   [provider]  venlafaxine XR (EFFEXOR-XR) 75 MG 24 hr capsule Take 75 mg by mouth daily with breakfast.    [provider]    Family History Family History  Problem Relation Age of Onset  . Cancer Father   . COPD Mother   . Colon cancer Neg Hx   . Colon polyps Neg Hx     Social History Social History   Tobacco Use  . Smoking status: Current Every Day Smoker    Packs/day: 0.50    Years: 40.00    Pack years: 20.00    Types: Cigarettes  . Smokeless tobacco: Never Used  Substance Use Topics  . Alcohol use: No  . Drug use: No     Allergies   Patient has no known allergies.   Review of Systems Review of Systems  All other systems reviewed and are negative.    Physical Exam Updated Vital Signs BP (!) 141/79 (BP Location: Right Arm)   Pulse 82   Temp 98.1 F (36.7 C) (Oral)   Resp 17   Ht 1.575 m (5\' 2" )   Wt 89.9 kg   SpO2 95%   BMI 36.27 kg/m   Physical Exam Vitals signs and nursing note reviewed.  Constitutional:      Appearance: She is well-developed. She  is not diaphoretic.  HENT:     Head: Normocephalic and atraumatic.  Eyes:     General:        Right eye: No discharge.        Left eye: No discharge.     Conjunctiva/sclera: Conjunctivae normal.  Pulmonary:     Effort: Pulmonary effort is normal. No respiratory distress.  Skin:    General: Skin is warm and dry.  Findings: Rash present. No erythema.     Comments: 5 or 6 well-circumscribed erythematous pruritic lesions, no vesicles, no pustules, no petechiae or purpura, located on the posterior lateral right thigh and calf  Neurological:     Mental Status: She is alert.     Coordination: Coordination normal.      ED Treatments / Results  Labs (all labs ordered are listed, but only abnormal results are displayed) Labs Reviewed - No data to display  EKG None  Radiology No results found.  Procedures Procedures (including critical care time)  Medications Ordered in ED Medications  hydrOXYzine (ATARAX/VISTARIL) tablet 25 mg (has no administration in time range)     Initial Impression / Assessment and Plan / ED Course  I have reviewed the triage vital signs and the nursing notes.  Pertinent labs & imaging results that were available during my care of the patient were reviewed by me and considered in my medical decision making (see chart for details).        No signs of cellulitis, no pathologic features to suggest further intervention is necessary.  She can be treated with antihistamines and topical steroids as these are likely insect envenomation's.  There is no signs of systemic disease.  Patient stable for discharge, given antihistamine prior to discharge with prescription for topical steroid.  Final Clinical Impressions(s) / ED Diagnoses   Final diagnoses:  Insect bite of right lower leg, initial encounter    ED Discharge Orders         Ordered    hydrOXYzine (ATARAX/VISTARIL) 25 MG tablet  Every 6 hours     01/07/19 1220    triamcinolone ointment  (KENALOG) 0.5 %  2 times daily     01/07/19 1221           Eber HongMiller, Sang Blount, MD 01/07/19 1221

## 2019-01-07 NOTE — Discharge Instructions (Signed)
Please apply the triamcinolone ointment 2 times daily to help with itching on that rash.  You may also use the antihistamine Vistaril (hydroxyzine) provided every 6 hours as needed however this can be sedating and should be used with extreme caution.  Do not drive while taking this medication.    Present to the ER if you develop increasing pain, redness, fever or any other severe or worsening symptoms.

## 2019-01-07 NOTE — ED Triage Notes (Signed)
Patient comes to the ED with itchy, red rash type on right leg for 2 days.

## 2019-01-11 ENCOUNTER — Other Ambulatory Visit: Payer: Self-pay

## 2019-01-11 DIAGNOSIS — R1900 Intra-abdominal and pelvic swelling, mass and lump, unspecified site: Secondary | ICD-10-CM

## 2019-01-11 NOTE — Telephone Encounter (Signed)
Received fax from [place], CT abdomen/pelvis w/contrast denied. Recommend CT abdomen w/contrast instead. Notify [facility] by end of the day if CT abdomen is acceptable. [phone]. Case ID: [id number].  Routing to EG in AB's absence.

## 2019-01-11 NOTE — Telephone Encounter (Signed)
EG gave ok for CT abdomen w/ contrast.  Called [employer] and spoke to [employer] CT abd w/contrast approved. PA# [id number], valid 01/11/19-07/10/19.   Called Central Scheduling, appt changed to CT abd order.  Tried to call pt, no answer, LMOVM and informed pt.

## 2019-01-11 NOTE — Telephone Encounter (Signed)
Noted, no further recommendations. 

## 2019-01-20 ENCOUNTER — Ambulatory Visit (HOSPITAL_COMMUNITY)
Admission: RE | Admit: 2019-01-20 | Discharge: 2019-01-20 | Disposition: A | Discharge status: Home / Self Care | Payer: Medicare HMO | Source: Ambulatory Visit | Attending: Gastroenterology | Admitting: Gastroenterology

## 2019-01-20 ENCOUNTER — Other Ambulatory Visit: Payer: Self-pay

## 2019-01-20 DIAGNOSIS — R1900 Intra-abdominal and pelvic swelling, mass and lump, unspecified site: Secondary | ICD-10-CM | POA: Diagnosis not present

## 2019-01-20 LAB — POCT I-STAT CREATININE: Creatinine, Ser: 0.8 mg/dL (ref 0.44–1.00)

## 2019-01-20 MED ORDER — IOHEXOL 300 MG/ML  SOLN
100.0000 mL | Freq: Once | INTRAMUSCULAR | Status: AC | PRN
  Administered 2019-01-20: 100 mL via INTRAVENOUS

## 2019-01-24 ENCOUNTER — Telehealth: Payer: Self-pay | Admitting: Internal Medicine

## 2019-01-24 DIAGNOSIS — Z7689 Persons encountering health services in other specified circumstances: Secondary | ICD-10-CM

## 2019-01-24 NOTE — Telephone Encounter (Signed)
I am not sure what lab results she is requesting? I don't have anything that I ordered.   I do see that the CT I ordered did not go to my box; it was in Eric's box to sign off on. Please let her know the abdominal mass is an umbilical hernia containing fat. No concerning findings.  Also: Happy Birthday tomorrow!

## 2019-01-24 NOTE — Telephone Encounter (Signed)
LMOVM for pt 

## 2019-01-24 NOTE — Addendum Note (Signed)
Addended by: Inge Rise on: 01/24/2019 02:52 PM   Modules accepted: Orders

## 2019-01-24 NOTE — Telephone Encounter (Signed)
Pt is inquiring about her lab results.  

## 2019-01-24 NOTE — Telephone Encounter (Signed)
This was not bothering her at time of visit. We can refer to [facility] but know there is a chance no surgery will be indicated, especially if not symptomatic.

## 2019-01-24 NOTE — Telephone Encounter (Signed)
Pt was calling to see about her lab results. [phone]

## 2019-01-24 NOTE — Telephone Encounter (Signed)
Pt notified of results. Pt would like to have hernia removed and wants it done in [place], [place]. Please advise.

## 2019-01-24 NOTE — Progress Notes (Signed)
CT requested by me but routed to different provider. Periumbilical fat-containing hernia correlates with the area palpated at time of visit. Nursing staff to inform patient.

## 2019-01-24 NOTE — Telephone Encounter (Signed)
Patient called back. She is aware of below. She still wants referral bc she wants it out. Referral sent

## 2019-01-24 NOTE — Telephone Encounter (Signed)
Noted  

## 2019-01-25 NOTE — Telephone Encounter (Signed)
I received a VM from patient checking on status of appt for her referral. I advised patient [facility] will contact her to schedule.

## 2019-01-28 ENCOUNTER — Telehealth: Payer: Self-pay | Admitting: Internal Medicine

## 2019-01-28 NOTE — Telephone Encounter (Signed)
Called patient and is aware referral was sent and they will call to schedule

## 2019-01-28 NOTE — Telephone Encounter (Signed)
PATIENT CALLED AND SAID WE WERE SUPPOSED TO SEND HER TO A DOCTOR ABOUT HER HERNIA, SHE HAS NOT HEARD FROM THEM.  SHE JUST WANTED TO KNOW IF THEY WILL BE IN CONTACT WITH HER

## 2019-02-02 ENCOUNTER — Ambulatory Visit: Payer: Self-pay | Admitting: Surgery

## 2019-02-02 ENCOUNTER — Other Ambulatory Visit: Payer: Medicare HMO | Admitting: Adult Health

## 2019-02-13 ENCOUNTER — Encounter (HOSPITAL_BASED_OUTPATIENT_CLINIC_OR_DEPARTMENT_OTHER): Payer: Self-pay | Admitting: Surgery

## 2019-02-13 DIAGNOSIS — K439 Ventral hernia without obstruction or gangrene: Secondary | ICD-10-CM | POA: Diagnosis present

## 2019-02-13 NOTE — H&P (Signed)
General Surgery [facility]  [name] DOB: 1956/05/27 Unknown / Language: Cleophus Molt / Race: White Female   History of Present Illness   The patient is a 63 year old female who presents with an abdominal wall hernia.  CHIEF COMPLAINT: ventral hernia  Patient is self-referred. Patient has a ventral hernia in the midline of the upper abdominal wall which she states is been present for 4-5 months. It causes her intermittent discomfort. She has had a previous umbilical hernia repair approximately 5 years ago in [place], [place]. She did well from that surgery. She has had no other abdominal surgery. She denies any signs or symptoms of obstruction. She denies nausea or emesis. She presents today for evaluation. Medical history is notable for hypertension, diabetes, and hyperlipidemia.   Past Surgical History  No pertinent past surgical history   Diagnostic Studies History Mammogram  within last year  Allergies No Known Allergies   Medication History Venlafaxine HCl ER (75MG  Capsule ER 24HR, Oral) Active. valACYclovir HCl (1GM Tablet, Oral) Active. Triamcinolone Acetonide (0.5% Ointment, External) Active. Tyler Aas FlexTouch (100UNIT/ML Soln Pen-inj, Subcutaneous) Active. traZODone HCl (100MG  Tablet, Oral) Active. Sure Comfort Pen Needles (31G X 8 MM Misc,) Active. metFORMIN HCl (500MG  Tablet, Oral) Active. NovoLOG FlexPen (100UNIT/ML Soln Pen-inj, Subcutaneous) Active. Losartan Potassium (100MG  Tablet, Oral) Active. hydroCHLOROthiazide (12.5MG  Capsule, Oral) Active. Atorvastatin Calcium (20MG  Tablet, Oral) Active. hydrOXYzine HCl (25MG  Tablet, Oral) Active. Medications Reconciled  Family History First Degree Relatives  No pertinent family history   Pregnancy / Birth History Age at menarche  74 years. Age of menopause  >9 Gravida  2 Maternal age  39-20  Other Problems Back Pain  High blood pressure   Hypercholesterolemia   Review of Systems General Not Present- Appetite Loss, Chills, Fatigue, Fever, Night Sweats, Weight Gain and Weight Loss. Skin Not Present- Change in Wart/Mole, Dryness, Hives, Jaundice, New Lesions, Non-Healing Wounds, Rash and Ulcer. HEENT Not Present- Earache, Hearing Loss, Hoarseness, Nose Bleed, Oral Ulcers, Ringing in the Ears, Seasonal Allergies, Sinus Pain, Sore Throat, Visual Disturbances, Wears glasses/contact lenses and Yellow Eyes. Respiratory Not Present- Bloody sputum, Chronic Cough, Difficulty Breathing, Snoring and Wheezing. Breast Not Present- Breast Mass, Breast Pain, Nipple Discharge and Skin Changes. Cardiovascular Not Present- Chest Pain, Difficulty Breathing Lying Down, Leg Cramps, Palpitations, Rapid Heart Rate, Shortness of Breath and Swelling of Extremities. Gastrointestinal Not Present- Abdominal Pain, Bloating, Bloody Stool, Change in Bowel Habits, Chronic diarrhea, Constipation, Difficulty Swallowing, Excessive gas, Gets full quickly at meals, Hemorrhoids, Indigestion, Nausea, Rectal Pain and Vomiting. Female Genitourinary Not Present- Frequency, Nocturia, Painful Urination, Pelvic Pain and Urgency. Musculoskeletal Present- Back Pain. Not Present- Joint Pain, Joint Stiffness, Muscle Pain, Muscle Weakness and Swelling of Extremities. Neurological Not Present- Decreased Memory, Fainting, Headaches, Numbness, Seizures, Tingling, Tremor, Trouble walking and Weakness. Psychiatric Not Present- Anxiety, Bipolar, Change in Sleep Pattern, Depression, Fearful and Frequent crying. Endocrine Not Present- Cold Intolerance, Excessive Hunger, Hair Changes, Heat Intolerance, Hot flashes and New Diabetes. Hematology Not Present- Blood Thinners, Easy Bruising, Excessive bleeding, Gland problems, HIV and Persistent Infections.  Vitals  Weight: 201.2 lb Height: 62in Body Surface Area: 1.92 m Body Mass Index: 36.8 kg/m  Temp.: 97.59F (Temporal)  Pulse:  97 (Regular)  BP: 118/68(Sitting, Left Arm, Standard)  Physical Exam   See vital signs recorded above  GENERAL APPEARANCE Development: normal Nutritional status: normal Gross deformities: none  SKIN Rash, lesions, ulcers: none Induration, erythema: none Nodules: none palpable  EYES Conjunctiva and lids: normal Pupils:  equal and reactive Iris: normal bilaterally  EARS, NOSE, MOUTH, THROAT External ears: no lesion or deformity External nose: no lesion or deformity Hearing: grossly normal Lips: no lesion or deformity Dentition: normal for age Oral mucosa: moist  NECK Symmetric: yes Trachea: midline Thyroid: no palpable nodules in the thyroid bed  CHEST Respiratory effort: normal Retraction or accessory muscle use: no Breath sounds: normal bilaterally Rales, rhonchi, wheeze: none  CARDIOVASCULAR Auscultation: regular rhythm, normal rate Murmurs: none Pulses: carotid and radial pulse 2+ palpable Lower extremity edema: none Lower extremity varicosities: none  ABDOMEN Distension: none Masses: none palpable Tenderness: none Hepatosplenomegaly: not present Hernia: Palpation in the midline just above the umbilicus reveals a subcutaneous mass measuring approximately 3-4 cm in diameter. This is just to the left of midline. It is relatively firm. It decreases in size with palpation and manipulation but augments with Valsalva or sit up maneuver. It does not appear to be fully reducible. Fascial defect is likely approximately 2 cm in diameter. There is a moderate size rectus diastases in the upper abdomen. There is no sign of recurrence of the umbilical hernia. Incision over the umbilical hernia site is well-healed.  MUSCULOSKELETAL Station and gait: normal Digits and nails: no clubbing or cyanosis Muscle strength: grossly normal all extremities Range of motion: grossly normal all extremities Deformity: none  LYMPHATIC Cervical: none palpable Supraclavicular: none  palpable  PSYCHIATRIC Oriented to person, place, and time: yes Mood and affect: normal for situation Judgment and insight: appropriate for situation    Assessment & Plan  VENTRAL HERNIA WITHOUT OBSTRUCTION OR GANGRENE (K43.9)  Pt Education - Pamphlet Given - Hernia Surgery: discussed with patient and provided information.  Patient is self-referred. She [place] in [place]. She presents with a ventral hernia for repair.  On examination, there is a small ventral hernia just to the left of midline above the level of the umbilicus. This is partially reducible. It is tender to palpation. There is a moderate rectus diastases. Umbilical hernia has previously been repaired and appears to be intact with no evidence of recurrence.  I recommended open repair of ventral hernia with mesh patch. Patient is provided with written literature on the surgical procedure. This would be an outpatient surgery. We discussed restrictions on her activities after the surgery. We discussed the possibility of recurrence and need for additional surgery. Patient understands and wishes to proceed with surgery in the near future.  The risks and benefits of the procedure have been discussed at length with the patient. The patient understands the proposed procedure, potential alternative treatments, and the course of recovery to be expected. All of the patient's questions have been answered at this time. The patient wishes to proceed with surgery.   Darnell Levelodd Cotton Beckley, MD [facility] Office: [phone]

## 2019-02-14 ENCOUNTER — Encounter (HOSPITAL_BASED_OUTPATIENT_CLINIC_OR_DEPARTMENT_OTHER): Payer: Self-pay | Admitting: *Deleted

## 2019-02-14 ENCOUNTER — Other Ambulatory Visit: Payer: Self-pay

## 2019-02-15 ENCOUNTER — Other Ambulatory Visit (HOSPITAL_COMMUNITY)
Admission: RE | Admit: 2019-02-15 | Discharge: 2019-02-15 | Disposition: A | Discharge status: Home / Self Care | Payer: Medicare HMO | Source: Ambulatory Visit | Attending: Surgery | Admitting: Surgery

## 2019-02-15 ENCOUNTER — Encounter (HOSPITAL_BASED_OUTPATIENT_CLINIC_OR_DEPARTMENT_OTHER)
Admission: RE | Admit: 2019-02-15 | Discharge: 2019-02-15 | Disposition: A | Discharge status: Home / Self Care | Payer: Medicare HMO | Source: Ambulatory Visit | Attending: Surgery | Admitting: Surgery

## 2019-02-15 DIAGNOSIS — Z01818 Encounter for other preprocedural examination: Secondary | ICD-10-CM | POA: Insufficient documentation

## 2019-02-15 DIAGNOSIS — Z01812 Encounter for preprocedural laboratory examination: Secondary | ICD-10-CM | POA: Insufficient documentation

## 2019-02-15 DIAGNOSIS — Z20828 Contact with and (suspected) exposure to other viral communicable diseases: Secondary | ICD-10-CM | POA: Diagnosis not present

## 2019-02-15 LAB — BASIC METABOLIC PANEL
Anion gap: 14 (ref 5–15)
BUN: 22 mg/dL (ref 8–23)
CO2: 21 mmol/L — ABNORMAL LOW (ref 22–32)
Calcium: 9.7 mg/dL (ref 8.9–10.3)
Chloride: 107 mmol/L (ref 98–111)
Creatinine, Ser: 0.97 mg/dL (ref 0.44–1.00)
GFR calc Af Amer: >60 mL/min (ref >60)
GFR calc non Af Amer: >60 mL/min (ref >60)
Glucose, Bld: 124 mg/dL — ABNORMAL HIGH (ref 70–99)
Potassium: 4.5 mmol/L (ref 3.5–5.1)
Sodium: 142 mmol/L (ref 135–145)

## 2019-02-15 LAB — SARS CORONAVIRUS 2 (TAT 6-24 HRS): SARS Coronavirus 2: NEGATIVE

## 2019-02-15 NOTE — Progress Notes (Signed)
Surgical soap given with instructions, pt verbalized understanding.  

## 2019-02-18 ENCOUNTER — Encounter (HOSPITAL_BASED_OUTPATIENT_CLINIC_OR_DEPARTMENT_OTHER): Payer: Self-pay | Admitting: Anesthesiology

## 2019-02-18 ENCOUNTER — Encounter (HOSPITAL_BASED_OUTPATIENT_CLINIC_OR_DEPARTMENT_OTHER)
Admission: RE | Disposition: A | Discharge status: Home / Self Care | Payer: Self-pay | Source: Home / Self Care | Attending: Surgery

## 2019-02-18 ENCOUNTER — Ambulatory Visit (HOSPITAL_BASED_OUTPATIENT_CLINIC_OR_DEPARTMENT_OTHER): Payer: Medicare HMO | Admitting: Certified Registered"

## 2019-02-18 ENCOUNTER — Ambulatory Visit (HOSPITAL_BASED_OUTPATIENT_CLINIC_OR_DEPARTMENT_OTHER)
Admission: RE | Admit: 2019-02-18 | Discharge: 2019-02-18 | Disposition: A | Discharge status: Home / Self Care | Payer: Medicare HMO | Attending: Surgery | Admitting: Surgery

## 2019-02-18 ENCOUNTER — Other Ambulatory Visit: Payer: Self-pay

## 2019-02-18 DIAGNOSIS — K420 Umbilical hernia with obstruction, without gangrene: Secondary | ICD-10-CM | POA: Diagnosis not present

## 2019-02-18 DIAGNOSIS — I10 Essential (primary) hypertension: Secondary | ICD-10-CM | POA: Diagnosis not present

## 2019-02-18 DIAGNOSIS — F17200 Nicotine dependence, unspecified, uncomplicated: Secondary | ICD-10-CM | POA: Insufficient documentation

## 2019-02-18 DIAGNOSIS — E785 Hyperlipidemia, unspecified: Secondary | ICD-10-CM | POA: Insufficient documentation

## 2019-02-18 DIAGNOSIS — Z794 Long term (current) use of insulin: Secondary | ICD-10-CM | POA: Diagnosis not present

## 2019-02-18 DIAGNOSIS — E7800 Pure hypercholesterolemia, unspecified: Secondary | ICD-10-CM | POA: Insufficient documentation

## 2019-02-18 DIAGNOSIS — E1151 Type 2 diabetes mellitus with diabetic peripheral angiopathy without gangrene: Secondary | ICD-10-CM | POA: Diagnosis not present

## 2019-02-18 DIAGNOSIS — K439 Ventral hernia without obstruction or gangrene: Secondary | ICD-10-CM

## 2019-02-18 DIAGNOSIS — Z79899 Other long term (current) drug therapy: Secondary | ICD-10-CM | POA: Diagnosis not present

## 2019-02-18 DIAGNOSIS — J449 Chronic obstructive pulmonary disease, unspecified: Secondary | ICD-10-CM | POA: Diagnosis not present

## 2019-02-18 HISTORY — DX: Chronic obstructive pulmonary disease, unspecified: J44.9

## 2019-02-18 HISTORY — PX: VENTRAL HERNIA REPAIR: SHX424

## 2019-02-18 HISTORY — PX: INSERTION OF MESH: SHX5868

## 2019-02-18 HISTORY — DX: Other chronic pain: G89.29

## 2019-02-18 HISTORY — DX: Gastro-esophageal reflux disease without esophagitis: K21.9

## 2019-02-18 LAB — GLUCOSE, CAPILLARY
Glucose-Capillary: 197 mg/dL — ABNORMAL HIGH (ref 70–99)
Glucose-Capillary: 158 mg/dL — ABNORMAL HIGH (ref 70–99)

## 2019-02-18 SURGERY — REPAIR, HERNIA, VENTRAL
Anesthesia: General | Site: Abdomen

## 2019-02-18 MED ORDER — ONDANSETRON HCL 4 MG/2ML IJ SOLN
INTRAMUSCULAR | Status: AC
  Filled 2019-02-18: qty 2

## 2019-02-18 MED ORDER — PHENYLEPHRINE HCL (PRESSORS) 10 MG/ML IV SOLN
INTRAVENOUS | Status: DC | PRN
  Administered 2019-02-18 (×4): 80 ug via INTRAVENOUS

## 2019-02-18 MED ORDER — CEFAZOLIN SODIUM-DEXTROSE 2-4 GM/100ML-% IV SOLN
2.0000 g | INTRAVENOUS | Status: AC
  Administered 2019-02-18: 2 g via INTRAVENOUS

## 2019-02-18 MED ORDER — ONDANSETRON HCL 4 MG/2ML IJ SOLN
INTRAMUSCULAR | Status: DC | PRN
  Administered 2019-02-18: 4 mg via INTRAVENOUS

## 2019-02-18 MED ORDER — CHLORHEXIDINE GLUCONATE CLOTH 2 % EX PADS: 6.0000 | MEDICATED_PAD | Freq: Once | CUTANEOUS | Status: DC

## 2019-02-18 MED ORDER — FENTANYL CITRATE (PF) 100 MCG/2ML IJ SOLN
INTRAMUSCULAR | Status: AC
  Filled 2019-02-18: qty 6

## 2019-02-18 MED ORDER — LIDOCAINE 2% (20 MG/ML) 5 ML SYRINGE
INTRAMUSCULAR | Status: AC
  Filled 2019-02-18: qty 5

## 2019-02-18 MED ORDER — PROPOFOL 10 MG/ML IV BOLUS
INTRAVENOUS | Status: DC | PRN
  Administered 2019-02-18: 150 mg via INTRAVENOUS
  Administered 2019-02-18: 50 mg via INTRAVENOUS

## 2019-02-18 MED ORDER — OXYCODONE HCL 5 MG PO TABS: 5.0000 mg | ORAL_TABLET | Freq: Four times a day (QID) | ORAL | 0 refills | Status: DC | PRN

## 2019-02-18 MED ORDER — PROMETHAZINE HCL 25 MG/ML IJ SOLN: 6.2500 mg | INTRAMUSCULAR | Status: DC | PRN

## 2019-02-18 MED ORDER — OXYCODONE HCL 5 MG/5ML PO SOLN: 5.0000 mg | Freq: Once | ORAL | Status: AC | PRN

## 2019-02-18 MED ORDER — MIDAZOLAM HCL 2 MG/2ML IJ SOLN
INTRAMUSCULAR | Status: AC
  Filled 2019-02-18: qty 2

## 2019-02-18 MED ORDER — SCOPOLAMINE 1 MG/3DAYS TD PT72: 1.0000 | MEDICATED_PATCH | Freq: Once | TRANSDERMAL | Status: DC

## 2019-02-18 MED ORDER — CEFAZOLIN SODIUM-DEXTROSE 2-4 GM/100ML-% IV SOLN
INTRAVENOUS | Status: AC
  Filled 2019-02-18: qty 100

## 2019-02-18 MED ORDER — FENTANYL CITRATE (PF) 100 MCG/2ML IJ SOLN: 50.0000 ug | INTRAMUSCULAR | Status: DC | PRN

## 2019-02-18 MED ORDER — PROPOFOL 10 MG/ML IV BOLUS
INTRAVENOUS | Status: AC
  Filled 2019-02-18: qty 20

## 2019-02-18 MED ORDER — HYDROMORPHONE HCL 1 MG/ML IJ SOLN: 0.2500 mg | INTRAMUSCULAR | Status: DC | PRN

## 2019-02-18 MED ORDER — PHENYLEPHRINE 40 MCG/ML (10ML) SYRINGE FOR IV PUSH (FOR BLOOD PRESSURE SUPPORT)
PREFILLED_SYRINGE | INTRAVENOUS | Status: AC
  Filled 2019-02-18: qty 10

## 2019-02-18 MED ORDER — LIDOCAINE HCL (CARDIAC) PF 100 MG/5ML IV SOSY
PREFILLED_SYRINGE | INTRAVENOUS | Status: DC | PRN
  Administered 2019-02-18: 100 mg via INTRAVENOUS

## 2019-02-18 MED ORDER — MEPERIDINE HCL 25 MG/ML IJ SOLN: 6.2500 mg | INTRAMUSCULAR | Status: DC | PRN

## 2019-02-18 MED ORDER — 0.9 % SODIUM CHLORIDE (POUR BTL) OPTIME
TOPICAL | Status: DC | PRN
  Administered 2019-02-18: 11:00:00 1000 mL

## 2019-02-18 MED ORDER — BUPIVACAINE HCL (PF) 0.5 % IJ SOLN
INTRAMUSCULAR | Status: DC | PRN
  Administered 2019-02-18: 20 mL

## 2019-02-18 MED ORDER — OXYCODONE HCL 5 MG PO TABS
ORAL_TABLET | ORAL | Status: AC
  Filled 2019-02-18: qty 1

## 2019-02-18 MED ORDER — DEXAMETHASONE SODIUM PHOSPHATE 10 MG/ML IJ SOLN
INTRAMUSCULAR | Status: AC
  Filled 2019-02-18: qty 1

## 2019-02-18 MED ORDER — SUGAMMADEX SODIUM 200 MG/2ML IV SOLN
INTRAVENOUS | Status: DC | PRN
  Administered 2019-02-18: 183 mg via INTRAVENOUS

## 2019-02-18 MED ORDER — OXYCODONE HCL 5 MG PO TABS
5.0000 mg | ORAL_TABLET | Freq: Once | ORAL | Status: AC | PRN
  Administered 2019-02-18: 5 mg via ORAL

## 2019-02-18 MED ORDER — DEXAMETHASONE SODIUM PHOSPHATE 4 MG/ML IJ SOLN
INTRAMUSCULAR | Status: DC | PRN
  Administered 2019-02-18: 10 mg via INTRAVENOUS

## 2019-02-18 MED ORDER — MIDAZOLAM HCL 2 MG/2ML IJ SOLN: 1.0000 mg | INTRAMUSCULAR | Status: DC | PRN

## 2019-02-18 MED ORDER — FENTANYL CITRATE (PF) 100 MCG/2ML IJ SOLN
INTRAMUSCULAR | Status: DC | PRN
  Administered 2019-02-18: 100 ug via INTRAVENOUS

## 2019-02-18 MED ORDER — SUCCINYLCHOLINE CHLORIDE 200 MG/10ML IV SOSY
PREFILLED_SYRINGE | INTRAVENOUS | Status: AC
  Filled 2019-02-18: qty 10

## 2019-02-18 MED ORDER — ROCURONIUM BROMIDE 10 MG/ML (PF) SYRINGE
PREFILLED_SYRINGE | INTRAVENOUS | Status: AC
  Filled 2019-02-18: qty 10

## 2019-02-18 MED ORDER — LACTATED RINGERS IV SOLN
INTRAVENOUS | Status: DC
  Administered 2019-02-18 (×2): via INTRAVENOUS

## 2019-02-18 MED ORDER — ROCURONIUM BROMIDE 100 MG/10ML IV SOLN
INTRAVENOUS | Status: DC | PRN
  Administered 2019-02-18: 50 mg via INTRAVENOUS

## 2019-02-18 MED ORDER — MIDAZOLAM HCL 5 MG/5ML IJ SOLN
INTRAMUSCULAR | Status: DC | PRN
  Administered 2019-02-18: 2 mg via INTRAVENOUS

## 2019-02-18 SURGICAL SUPPLY — 41 items
BLADE CLIPPER SURG (BLADE) ×2 IMPLANT
BLADE HEX COATED 2.75 (ELECTRODE) ×2 IMPLANT
BLADE SURG 15 STRL LF DISP TIS (BLADE) ×1 IMPLANT
BLADE SURG 15 STRL SS (BLADE) ×1
CANISTER SUCT 1200ML W/VALVE (MISCELLANEOUS) ×2 IMPLANT
CHLORAPREP W/TINT 26 (MISCELLANEOUS) ×2 IMPLANT
COVER BACK TABLE REUSABLE LG (DRAPES) ×2 IMPLANT
COVER MAYO STAND REUSABLE (DRAPES) ×2 IMPLANT
COVER WAND RF STERILE (DRAPES) IMPLANT
DECANTER SPIKE VIAL GLASS SM (MISCELLANEOUS) ×2 IMPLANT
DRAIN CHANNEL 19F RND (DRAIN) IMPLANT
DRAPE LAPAROTOMY 100X72 PEDS (DRAPES) ×2 IMPLANT
DRAPE UTILITY XL STRL (DRAPES) ×2 IMPLANT
ELECT REM PT RETURN 9FT ADLT (ELECTROSURGICAL) ×2
ELECTRODE REM PT RTRN 9FT ADLT (ELECTROSURGICAL) ×1 IMPLANT
EVACUATOR SILICONE 100CC (DRAIN) IMPLANT
GAUZE SPONGE 4X4 12PLY STRL LF (GAUZE/BANDAGES/DRESSINGS) ×2 IMPLANT
GLOVE SURG ORTHO 8.0 STRL STRW (GLOVE) ×2 IMPLANT
GOWN STRL REUS W/ TWL LRG LVL3 (GOWN DISPOSABLE) ×1 IMPLANT
GOWN STRL REUS W/ TWL XL LVL3 (GOWN DISPOSABLE) ×1 IMPLANT
GOWN STRL REUS W/TWL LRG LVL3 (GOWN DISPOSABLE) ×2
GOWN STRL REUS W/TWL XL LVL3 (GOWN DISPOSABLE) ×2
MESH VENTRALEX ST 1-7/10 CRC S (Mesh General) ×1 IMPLANT
NDL HYPO 25X1 1.5 SAFETY (NEEDLE) IMPLANT
NEEDLE HYPO 25X1 1.5 SAFETY (NEEDLE) IMPLANT
NS IRRIG 1000ML POUR BTL (IV SOLUTION) ×2 IMPLANT
PACK BASIN DAY SURGERY FS (CUSTOM PROCEDURE TRAY) ×2 IMPLANT
PENCIL BUTTON HOLSTER BLD 10FT (ELECTRODE) ×2 IMPLANT
SLEEVE SCD COMPRESS KNEE MED (MISCELLANEOUS) IMPLANT
SPONGE LAP 4X18 RFD (DISPOSABLE) ×2 IMPLANT
STRIP CLOSURE SKIN 1/2X4 (GAUZE/BANDAGES/DRESSINGS) ×2 IMPLANT
SUT ETHIBOND 0 MO6 C/R (SUTURE) IMPLANT
SUT ETHILON 3 0 PS 1 (SUTURE) IMPLANT
SUT MNCRL AB 4-0 PS2 18 (SUTURE) ×2 IMPLANT
SUT NOVA 0 T19/GS 22DT (SUTURE) ×1 IMPLANT
SUT VICRYL 3-0 CR8 SH (SUTURE) ×2 IMPLANT
SYR CONTROL 10ML LL (SYRINGE) IMPLANT
TAPE HYPAFIX 4 X10 (GAUZE/BANDAGES/DRESSINGS) ×2 IMPLANT
TOWEL GREEN STERILE FF (TOWEL DISPOSABLE) ×4 IMPLANT
TUBE CONNECTING 20X1/4 (TUBING) ×2 IMPLANT
YANKAUER SUCT BULB TIP NO VENT (SUCTIONS) ×2 IMPLANT

## 2019-02-18 NOTE — Interval H&P Note (Signed)
History and Physical Interval Note:  02/18/2019 11:14 AM  [name]  has presented today for surgery, with the diagnosis of VENTRAL HERNIA.  The various methods of treatment have been discussed with the patient and family. After consideration of risks, benefits and other options for treatment, the patient has consented to    Procedure(s): VENTRAL HERNIA REPAIR (N/A) INSERTION OF MESH (N/A) as a surgical intervention.    The patient's history has been reviewed, patient examined, no change in status, stable for surgery.  I have reviewed the patient's chart and labs.  Questions were answered to the patient's satisfaction.    Armandina Gemma, [facility] Surgery Office: [place]

## 2019-02-18 NOTE — Anesthesia Postprocedure Evaluation (Signed)
Anesthesia Post Note  Patient: [name]  Procedure(s) Performed: VENTRAL HERNIA REPAIR (N/A Abdomen) INSERTION OF MESH (N/A Abdomen)     Patient location during evaluation: PACU Anesthesia Type: General Level of consciousness: sedated and patient cooperative Pain management: pain level controlled Vital Signs Assessment: post-procedure vital signs reviewed and stable Respiratory status: spontaneous breathing Cardiovascular status: stable Anesthetic complications: no    Last Vitals:  Vitals:   02/18/19 1250 02/18/19 1327  BP: 138/77 (!) 143/79  Pulse: 70   Resp: 16 18  Temp:  36.7 C  SpO2: 97%     Last Pain:  Vitals:   02/18/19 1300  TempSrc:   PainSc: 10-Worst pain ever                 Nolon Nations

## 2019-02-18 NOTE — Transfer of Care (Signed)
Immediate Anesthesia Transfer of Care Note  Patient: [name]  Procedure(s) Performed: VENTRAL HERNIA REPAIR (N/A Abdomen) INSERTION OF MESH (N/A Abdomen)  Patient Location: PACU  Anesthesia Type:General  Level of Consciousness: awake and patient cooperative  Airway & Oxygen Therapy: Patient Spontanous Breathing and Patient connected to face mask oxygen  Post-op Assessment: Report given to RN and Post -op Vital signs reviewed and stable  Post vital signs: Reviewed and stable  Last Vitals:  Vitals Value Taken Time  BP    Temp    Pulse 83 02/18/19 1227  Resp 13 02/18/19 1227  SpO2 97 % 02/18/19 1227  Vitals shown include unvalidated device data.  Last Pain:  Vitals:   02/18/19 0913  TempSrc: Oral  PainSc: 0-No pain         Complications: No apparent anesthesia complications

## 2019-02-18 NOTE — Anesthesia Preprocedure Evaluation (Signed)
Anesthesia Evaluation  Patient identified by MRN, date of birth, ID band Patient awake    Reviewed: Allergy & Precautions, NPO status , Patient's Chart, lab work & pertinent test results  Airway Mallampati: II  TM Distance: >3 FB Neck ROM: Full    Dental  (+) Dental Advisory Given   Pulmonary pneumonia, COPD, Current Smoker and Patient abstained from smoking.,    breath sounds clear to auscultation       Cardiovascular hypertension, + Peripheral Vascular Disease   Rhythm:Regular Rate:Normal     Neuro/Psych  Headaches, PSYCHIATRIC DISORDERS Depression    GI/Hepatic Neg liver ROS, GERD  ,  Endo/Other  diabetes  Renal/GU      Musculoskeletal  (+) Arthritis ,   Abdominal   Peds  Hematology   Anesthesia Other Findings   Reproductive/Obstetrics                             Anesthesia Physical  Anesthesia Plan  ASA: III  Anesthesia Plan: General   Post-op Pain Management:    Induction: Intravenous  PONV Risk Score and Plan: 4 or greater and Ondansetron, Dexamethasone, Treatment may vary due to age or medical condition and Midazolam  Airway Management Planned: Oral ETT  Additional Equipment: None  Intra-op Plan:   Post-operative Plan: Extubation in OR  Informed Consent: I have reviewed the patients History and Physical, chart, labs and discussed the procedure including the risks, benefits and alternatives for the proposed anesthesia with the patient or authorized representative who has indicated his/her understanding and acceptance.     Dental advisory given  Plan Discussed with: CRNA  Anesthesia Plan Comments:         Anesthesia Quick Evaluation

## 2019-02-18 NOTE — Discharge Instructions (Signed)

## 2019-02-18 NOTE — Anesthesia Procedure Notes (Signed)
Procedure Name: Intubation Date/Time: 02/18/2019 11:30 AM Performed by: Marrianne Mood, CRNA Pre-anesthesia Checklist: Patient identified, Emergency Drugs available, Suction available, Patient being monitored and Timeout performed Patient Re-evaluated:Patient Re-evaluated prior to induction Oxygen Delivery Method: Circle system utilized Preoxygenation: Pre-oxygenation with 100% oxygen Induction Type: IV induction Ventilation: Mask ventilation without difficulty Laryngoscope Size: Miller and 3 Grade View: Grade II Tube type: Oral Tube size: 7.0 mm Number of attempts: 1 Airway Equipment and Method: Stylet and Oral airway Placement Confirmation: ETT inserted through vocal cords under direct vision,  positive ETCO2 and breath sounds checked- equal and bilateral Secured at: 20 cm Tube secured with: Tape Dental Injury: Teeth and Oropharynx as per pre-operative assessment

## 2019-02-18 NOTE — Op Note (Signed)
Umbilical Hernia, Open, Procedure Note  Pre-operative Diagnosis: Ventral hernia  Post-operative Diagnosis: Recurrent umbilical hernia, incarcerated omentum  Procedure: Open repair of incarcerated recurrent umbilical hernia with Ventralex ST mesh patch (4.3 cm)  Surgeon:  Armandina Gemma, MD  Anesthesia:  General  Preparation:  Chlora-prep  Estimated Blood Loss: minimal  Complications:  none  Indications: Patient has a ventral hernia in the midline of the upper abdominal wall which she states is been present for 4-5 months. It causes her intermittent discomfort. She has had a previous umbilical hernia repair approximately 5 years ago in [place], [place]. She did well from that surgery. She has had no other abdominal surgery. She denies any signs or symptoms of obstruction.   Procedure Details  The patient was brought to operating room and placed in a supine position on the operating room table.  Following induction of general anesthesia, the patient was prepped and draped in the usual aseptic fashion.  After ascertaining that an adequate level of anesthesia been achieved, an infraumbilical incision was made transversely with a #15 blade.  Dissection was carried through the subcutaneous tissues and the hernia sac was identified.  Hernia sac was dissected off the posterior aspect of the umbilical skin and the entire sac was dissected out down to the fascial defect. Suture material from the previous repair is noted in the midline.  Recurrence is at the superior aspect of the previous repair.  The hernia sac is opened and the incarcerated omentum is resected, as is the hernia sac.  Preperitoneal space was developed with gentle blunt dissection.  A Ventralex ST 4.3 cm mesh patch was selected for the repair.  The mesh patch was placed into the preperitoneal space and deployed circumferentially.  It was secured to the overlying fascia with the closure of the fascia with interrupted  0-Novafil simple sutures.  Local field block was placed with Marcaine.  Subcutaneous tissues were closed with interrupted 3-0 Vicryl sutures.  Skin was anesthetized with local anesthetic.  Skin edges were approximated with a running 4-0 Monocryl subcuticular suture.  Wound was washed and dried and Dermabond applied.  The patient was awakened from anesthesia and brought to the recovery room.  The patient tolerated the procedure well.  Armandina Gemma, MD [facility] Office: [phone]

## 2019-02-21 ENCOUNTER — Encounter (HOSPITAL_BASED_OUTPATIENT_CLINIC_OR_DEPARTMENT_OTHER): Payer: Self-pay | Admitting: Surgery

## 2019-03-10 NOTE — Patient Instructions (Signed)
Your procedure is scheduled on: 03/17/2019  Report to [facility] at   7:00  AM.  Call this number if you have problems the morning of surgery: [phone]   Remember:              Follow Directions on the letter you received from Your Physician's office regarding the Bowel Prep  :  Take these medicines the morning of surgery with A SIP OF WATER: Pepcid, Gabapentin, losartan, and Effexor             Take only 1/2 dose of Lantus  (10 units) the night before surgery. No diabetic medication the           morning of surgery   Do not wear jewelry, make-up or nail polish.    Do not bring valuables to the hospital.  Contacts, dentures or bridgework may not be worn into surgery.  .   Patients discharged the day of surgery will not be allowed to drive home.     Colonoscopy, Adult, Care After This sheet gives you information about how to care for yourself after your procedure. Your health care provider may also give you more specific instructions. If you have problems or questions, contact your health care provider. What can I expect after the procedure? After the procedure, it is common to have:  A small amount of blood in your stool for 24 hours after the procedure.  Some gas.  Mild abdominal cramping or bloating.  Follow these instructions at home: General instructions   For the first 24 hours after the procedure: ? Do not drive or use machinery. ? Do not sign important documents. ? Do not drink alcohol. ? Do your regular daily activities at a slower pace than normal. ? Eat soft, easy-to-digest foods. ? Rest often.  Take over-the-counter or prescription medicines only as told by your health care provider.  It is up to you to get the results of your procedure. Ask your health care provider, or the department performing the procedure, when your results will be ready. Relieving cramping and bloating  Try walking around when you have cramps or feel bloated.  Apply heat to  your abdomen as told by your health care provider. Use a heat source that your health care provider recommends, such as a moist heat pack or a heating pad. ? Place a towel between your skin and the heat source. ? Leave the heat on for 20-30 minutes. ? Remove the heat if your skin turns bright red. This is especially important if you are unable to feel pain, heat, or cold. You may have a greater risk of getting burned. Eating and drinking  Drink enough fluid to keep your urine clear or pale yellow.  Resume your normal diet as instructed by your health care provider. Avoid heavy or fried foods that are hard to digest.  Avoid drinking alcohol for as long as instructed by your health care provider. Contact a health care provider if:  You have blood in your stool 2-3 days after the procedure. Get help right away if:  You have more than a small spotting of blood in your stool.  You pass large blood clots in your stool.  Your abdomen is swollen.  You have nausea or vomiting.  You have a fever.  You have increasing abdominal pain that is not relieved with medicine. This information is not intended to replace advice given to you by your health care provider. Make sure you discuss  any questions you have with your health care provider. Document Released: 01/22/2004 Document Revised: 03/03/2016 Document Reviewed: 08/21/2015 Elsevier Interactive Patient Education  [employer]2018 [employer]vier Inc.

## 2019-03-11 ENCOUNTER — Telehealth: Payer: Self-pay | Admitting: *Deleted

## 2019-03-11 NOTE — Telephone Encounter (Signed)
Patient had recent ventral hernia repair 02/18/2019. Patient scheduled for TCS 03/17/2019. EG, please advise if patient should cancel procedure and if she should come back in to r/s? thanks

## 2019-03-11 NOTE — Telephone Encounter (Signed)
-----   Message from Wilmer Floor, RN sent at 03/11/2019 10:10 AM EDT ----- Regarding: Patient needs instructions and RX for bowel prep [name],   [name] had a Ventral hernia repair 02/18/2019 at [facility]. I am not sure if [name] or [name] know she had surgery recently.  She may still be a little tender.     I called to tell her the time to be her for a colonoscopy on 03/17/2019.   She said she does not have her instructions and that she does not have her prep.   I called [facility] and the said she picked it up.   [name] said she had drank a prep for some test recently.   I think she is on her pain meds from recent surgery.   She said she did not have a prep at home. If she is still having the colonoscopy, Can you call her in another RX for Tri-lyte and I will mail her another instruction letter.   Thanks   [name]

## 2019-03-11 NOTE — Telephone Encounter (Signed)
Let's cancel for now and bring her back to reschedule when she is through her post-op course and feeling better. Let's plan for an office visit in 4 weeks.

## 2019-03-14 ENCOUNTER — Other Ambulatory Visit (HOSPITAL_COMMUNITY)
Admission: RE | Admit: 2019-03-14 | Discharge: 2019-03-14 | Disposition: A | Discharge status: Home / Self Care | Payer: Medicare HMO | Source: Ambulatory Visit | Attending: Internal Medicine | Admitting: Internal Medicine

## 2019-03-14 ENCOUNTER — Encounter (HOSPITAL_COMMUNITY): Payer: Self-pay

## 2019-03-14 ENCOUNTER — Encounter (HOSPITAL_COMMUNITY)
Admission: RE | Admit: 2019-03-14 | Discharge: 2019-03-14 | Disposition: A | Discharge status: Home / Self Care | Payer: Medicare HMO | Source: Ambulatory Visit | Attending: Internal Medicine | Admitting: Internal Medicine

## 2019-03-14 NOTE — Telephone Encounter (Signed)
Called patient. She is aware we are cancelling procedure. She states she has a lot going on right now and she will call back to schedule her appt. Called endo and aware to cancel procedure

## 2019-03-16 ENCOUNTER — Other Ambulatory Visit: Payer: Medicare HMO | Admitting: Obstetrics and Gynecology

## 2019-03-17 ENCOUNTER — Encounter (HOSPITAL_COMMUNITY): Admission: RE | Payer: Self-pay | Source: Home / Self Care

## 2019-03-17 ENCOUNTER — Ambulatory Visit (HOSPITAL_COMMUNITY): Admission: RE | Admit: 2019-03-17 | Payer: Medicare HMO | Source: Home / Self Care | Admitting: Internal Medicine

## 2019-03-17 SURGERY — COLONOSCOPY WITH PROPOFOL
Anesthesia: Monitor Anesthesia Care

## 2019-03-29 ENCOUNTER — Telehealth: Payer: Self-pay | Admitting: Obstetrics and Gynecology

## 2019-03-29 NOTE — Telephone Encounter (Signed)

## 2019-03-30 ENCOUNTER — Other Ambulatory Visit: Payer: Medicare HMO | Admitting: Obstetrics and Gynecology

## 2019-04-27 ENCOUNTER — Other Ambulatory Visit: Payer: Medicare HMO | Admitting: Obstetrics and Gynecology

## 2019-05-05 ENCOUNTER — Encounter (HOSPITAL_COMMUNITY): Payer: Self-pay

## 2019-05-05 ENCOUNTER — Emergency Department (HOSPITAL_COMMUNITY)
Admission: EM | Admit: 2019-05-05 | Discharge: 2019-05-06 | Disposition: A | Discharge status: Home / Self Care | Payer: Medicare HMO | Attending: Emergency Medicine | Admitting: Emergency Medicine

## 2019-05-05 ENCOUNTER — Other Ambulatory Visit: Payer: Self-pay

## 2019-05-05 DIAGNOSIS — J449 Chronic obstructive pulmonary disease, unspecified: Secondary | ICD-10-CM | POA: Insufficient documentation

## 2019-05-05 DIAGNOSIS — E119 Type 2 diabetes mellitus without complications: Secondary | ICD-10-CM | POA: Diagnosis not present

## 2019-05-05 DIAGNOSIS — F17210 Nicotine dependence, cigarettes, uncomplicated: Secondary | ICD-10-CM | POA: Insufficient documentation

## 2019-05-05 DIAGNOSIS — Z79899 Other long term (current) drug therapy: Secondary | ICD-10-CM | POA: Diagnosis not present

## 2019-05-05 DIAGNOSIS — I10 Essential (primary) hypertension: Secondary | ICD-10-CM | POA: Insufficient documentation

## 2019-05-05 DIAGNOSIS — R05 Cough: Secondary | ICD-10-CM | POA: Diagnosis present

## 2019-05-05 DIAGNOSIS — J40 Bronchitis, not specified as acute or chronic: Secondary | ICD-10-CM | POA: Insufficient documentation

## 2019-05-05 DIAGNOSIS — Z794 Long term (current) use of insulin: Secondary | ICD-10-CM | POA: Insufficient documentation

## 2019-05-05 MED ORDER — HYDROCODONE-HOMATROPINE 5-1.5 MG/5ML PO SYRP: 5.0000 mL | ORAL_SOLUTION | Freq: Every day | ORAL | 0 refills | Status: AC

## 2019-05-05 MED ORDER — HYDROCODONE-HOMATROPINE 5-1.5 MG/5ML PO SYRP
5.0000 mL | ORAL_SOLUTION | Freq: Once | ORAL | Status: AC
  Administered 2019-05-06: 5 mL via ORAL
  Filled 2019-05-05: qty 5

## 2019-05-05 NOTE — ED Provider Notes (Signed)
[facility] EMERGENCY DEPARTMENT Provider Note   CSN: [id number] Arrival date & time: 05/05/19  2320     History   Chief Complaint Chief Complaint  Patient presents with  . Cough    HPI [name] is a 63 y.o. female.     Patient presents to the emergency department for evaluation of cough.  Patient reports that this has been ongoing for at least a month.  She had a Covid swab approximately a month ago that was negative.  She was seen by her primary doctor today and was prescribed Zithromax and had another Covid swab which is pending.  She reports that she has not been able to sleep because the cough is much worse tonight.  She also has been given prednisone and an albuterol inhaler by her primary doctor but these do not seem to be helping.  She has not had a fever.     Past Medical History:  Diagnosis Date  . Arthritis    degenerative lumbar spine   . 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 Chronic back pain   . COPD (chronic obstructive pulmonary disease) (HCC)    smokes 1/2ppd  . Depression   . GERD (gastroesophageal reflux disease)   . High cholesterol   . History of cerebral aneurysm repair   . Hypertension    states under control with med., has been on med. x "a long time"  . Insulin dependent diabetes mellitus     Patient Active Problem List   Diagnosis Date Noted  . Ventral hernia 02/13/2019  . Encounter for screening colonoscopy 12/22/2018  . Abdominal mass 12/22/2018  . Neuralgia involving scalp 02/09/2016  . Cerebral aneurysm, nonruptured 02/15/2015  . Cerebral aneurysm 09/07/2014    Past Surgical History:  Procedure Laterality Date  . CARPAL TUNNEL RELEASE Right 03/25/2016   Procedure: RIGHT ENDOSCOPIC CARPAL TUNNEL RELEASE;  Surgeon: Mack Hookavid Thompson, MD;  Location: [facility];  Service: Orthopedics;  Laterality: Right;  . CARPAL TUNNEL RELEASE Left 04/08/2016   Procedure: LEFT CARPAL TUNNEL RELEASE ENDOSCOPIC;  Surgeon:  Mack Hookavid Thompson, MD;  Location: [facility];  Service: Orthopedics;  Laterality: Left;  . CEREBRAL ANGIOGRAM  07/18/2014  . CRANIOTOMY Right 02/15/2015   Procedure: Right Frontotemporal Craniotomy for aneurysm clipping;  Surgeon: Lisbeth RenshawNeelesh Nundkumar, MD;  Location: [facility];  Service: Neurosurgery;  Laterality: Right;  right  . INSERTION OF MESH N/A 02/18/2019   Procedure: INSERTION OF MESH;  Surgeon: Darnell LevelGerkin, Todd, MD;  Location: [facility];  Service: General;  Laterality: N/A;  . RADIOLOGY WITH ANESTHESIA N/A 09/07/2014   Procedure: Embolization Stent Supported Coil of Aneurysm;  Surgeon: Lisbeth RenshawNeelesh Nundkumar, MD;  [facility];  Service: Radiology;  Laterality: N/A;  . TUBAL LIGATION    . UMBILICAL HERNIA REPAIR  05/14/1007  . VENTRAL HERNIA REPAIR N/A 02/18/2019   Procedure: VENTRAL HERNIA REPAIR;  Surgeon: Darnell LevelGerkin, Todd, MD;  Location: [facility];  Service: General;  Laterality: N/A;     OB History    Gravida  2   Para  2   Term  2   Preterm      AB      Living        SAB      TAB      Ectopic      Multiple      Live Births  Home Medications    Prior to Admission medications   Medication Sig Start Date End Date Taking? Authorizing Provider  atorvastatin (LIPITOR) 20 MG tablet Take 20 mg by mouth daily.    [provider]  Cyanocobalamin (VITAMIN B-12 IJ) Inject 1,000 mcg as directed every 30 (thirty) days.     [provider]  gabapentin (NEURONTIN) 300 MG capsule Take 1 capsule (300 mg total) by mouth 3 (three) times daily. 02/12/16   Anson Fret, MD  hydrochlorothiazide (MICROZIDE) 12.5 MG capsule Take 12.5 mg by mouth daily.  12/30/18   [provider]  HYDROcodone-acetaminophen (NORCO/VICODIN) 5-325 MG per tablet Take 1 tablet by mouth every 6 (six) hours as needed for moderate pain.    [provider]  HYDROcodone-homatropine (HYCODAN) 5-1.5 MG/5ML syrup Take 5 mLs  by mouth at bedtime. 05/05/19   Gilda Crease, MD  insulin aspart (NOVOLOG) 100 UNIT/ML FlexPen Inject 8 Units into the skin 4 (four) times daily.    [provider]  Insulin Glargine (LANTUS) 100 UNIT/ML Solostar Pen Inject 17 Units into the skin at bedtime.     [provider]  losartan (COZAAR) 100 MG tablet Take 100 mg by mouth daily.  12/30/18   [provider]  metFORMIN (GLUCOPHAGE) 500 MG tablet Take 500 mg by mouth 2 (two) times daily with a meal.     [provider]  valACYclovir (VALTREX) 1000 MG tablet Take 1 tablet by mouth 2 (two) times daily as needed. 12/30/18   [provider]  venlafaxine XR (EFFEXOR-XR) 75 MG 24 hr capsule Take 75 mg by mouth daily with breakfast.    [provider]    Family History Family History  Problem Relation Age of Onset  . Cancer Father   . COPD Mother   . Colon cancer Neg Hx   . Colon polyps Neg Hx     Social History Social History   Tobacco Use  . Smoking status: Current Every Day Smoker    Packs/day: 0.50    Years: 40.00    Pack years: 20.00    Types: Cigarettes  . Smokeless tobacco: Never Used  Substance Use Topics  . Alcohol use: No  . Drug use: No     Allergies   Patient has no known allergies.   Review of Systems Review of Systems  Respiratory: Positive for cough.   All other systems reviewed and are negative.    Physical Exam Updated Vital Signs BP (!) 150/70 (BP Location: Right Arm)   Pulse 72   Temp 98.4 F (36.9 C) (Oral)   Resp 20   Ht 5\' 2"  (1.575 m)   Wt 91.6 kg   SpO2 99%   BMI 36.95 kg/m   Physical Exam Vitals signs and nursing note reviewed.  Constitutional:      General: She is not in acute distress.    Appearance: Normal appearance. She is well-developed.  HENT:     Head: Normocephalic and atraumatic.     Right Ear: Hearing normal.     Left Ear: Hearing normal.     Nose: Nose normal.  Eyes:     Conjunctiva/sclera: Conjunctivae  normal.     Pupils: Pupils are equal, round, and reactive to light.  Neck:     Musculoskeletal: Normal range of motion and neck supple.  Cardiovascular:     Rate and Rhythm: Regular rhythm.     Heart sounds: S1 normal and S2 normal. No murmur. No friction rub.  No gallop.   Pulmonary:     Effort: Pulmonary effort is normal. No respiratory distress.     Breath sounds: Normal breath sounds.  Chest:     Chest wall: No tenderness.  Abdominal:     General: Bowel sounds are normal.     Palpations: Abdomen is soft.     Tenderness: There is no abdominal tenderness. There is no guarding or rebound. Negative signs include Murphy's sign and McBurney's sign.     Hernia: No hernia is present.  Musculoskeletal: Normal range of motion.  Skin:    General: Skin is warm and dry.     Findings: No rash.  Neurological:     Mental Status: She is alert and oriented to person, place, and time.     GCS: GCS eye subscore is 4. GCS verbal subscore is 5. GCS motor subscore is 6.     Cranial Nerves: No cranial nerve deficit.     Sensory: No sensory deficit.     Coordination: Coordination normal.  Psychiatric:        Speech: Speech normal.        Behavior: Behavior normal.        Thought Content: Thought content normal.      ED Treatments / Results  Labs (all labs ordered are listed, but only abnormal results are displayed) Labs Reviewed - No data to display  EKG None  Radiology No results found.  Procedures Procedures (including critical care time)  Medications Ordered in ED Medications  HYDROcodone-homatropine (HYCODAN) 5-1.5 MG/5ML syrup 5 mL (has no administration in time range)     Initial Impression / Assessment and Plan / ED Course  I have reviewed the triage vital signs and the nursing notes.  Pertinent labs & imaging results that were available during my care of the patient were reviewed by me and considered in my medical decision making (see chart for details).         Patient with persistent cough.  Oxygen saturations are 99%.  She is afebrile.  All vital signs are normal except for very slight hypertension.  She has a Covid swab already pending and has been started on a Z-Pak today.  She is here because of persistent cough.  No obvious bronchospasm currently, she has been prescribed prednisone and albuterol already.  Final Clinical Impressions(s) / ED Diagnoses   Final diagnoses:  Bronchitis    ED Discharge Orders         Ordered    HYDROcodone-homatropine (HYCODAN) 5-1.5 MG/5ML syrup  Daily at bedtime     05/05/19 2354           Orpah Greek, MD 05/05/19 2354

## 2019-05-05 NOTE — ED Triage Notes (Signed)
Pt states she saw her pmd today, given a z-pac for same.  Pt states she wants something to stop the cough bc she can't sleep.

## 2019-06-06 ENCOUNTER — Telehealth: Payer: Self-pay | Admitting: Obstetrics and Gynecology

## 2019-06-06 NOTE — Telephone Encounter (Signed)
Tried to reach the patient to remind her of her appointment/restrictions, mailbox is full. 

## 2019-06-08 ENCOUNTER — Other Ambulatory Visit: Payer: Medicare HMO | Admitting: Obstetrics and Gynecology

## 2019-07-20 ENCOUNTER — Other Ambulatory Visit: Payer: Medicare HMO | Admitting: Obstetrics and Gynecology

## 2019-09-13 ENCOUNTER — Telehealth: Payer: Self-pay | Admitting: Obstetrics and Gynecology

## 2019-09-13 NOTE — Telephone Encounter (Signed)

## 2019-09-14 ENCOUNTER — Other Ambulatory Visit (HOSPITAL_COMMUNITY)
Admission: RE | Admit: 2019-09-14 | Discharge: 2019-09-14 | Disposition: A | Discharge status: Home / Self Care | Payer: Medicare HMO | Source: Ambulatory Visit | Attending: Obstetrics and Gynecology | Admitting: Obstetrics and Gynecology

## 2019-09-14 ENCOUNTER — Other Ambulatory Visit: Payer: Self-pay

## 2019-09-14 ENCOUNTER — Encounter: Payer: Self-pay | Admitting: Obstetrics and Gynecology

## 2019-09-14 ENCOUNTER — Ambulatory Visit (INDEPENDENT_AMBULATORY_CARE_PROVIDER_SITE_OTHER): Payer: Medicare HMO | Admitting: Obstetrics and Gynecology

## 2019-09-14 VITALS — BP 137/84 | HR 72 | Ht 63.0 in | Wt 207.4 lb

## 2019-09-14 DIAGNOSIS — Z124 Encounter for screening for malignant neoplasm of cervix: Secondary | ICD-10-CM | POA: Insufficient documentation

## 2019-09-14 DIAGNOSIS — Z01419 Encounter for gynecological examination (general) (routine) without abnormal findings: Secondary | ICD-10-CM | POA: Diagnosis not present

## 2019-09-14 DIAGNOSIS — Z1151 Encounter for screening for human papillomavirus (HPV): Secondary | ICD-10-CM | POA: Diagnosis not present

## 2019-09-14 DIAGNOSIS — Z780 Asymptomatic menopausal state: Secondary | ICD-10-CM | POA: Insufficient documentation

## 2019-09-14 NOTE — Progress Notes (Signed)
Patient ID: [name], female   DOB: 12-May-1956, 64 y.o.   MRN: [id number]   Assessment:  Annual Gyn Exam PAP smear Plan:  1. Pap smear done, next pap due in three years 2. Return annually or prn 3    Annual mammogram advised after age 54 Subjective:  [name] is a 64 y.o. female G2P2000 who presents for annual exam. No LMP recorded. Patient is postmenopausal. The patient has no complaints today.  The following portions of the patient's history were reviewed and updated as appropriate: allergies, current medications, past family history, past medical history, past social history, past surgical history and problem list. Past Medical History:  Diagnosis Date  . Arthritis    degenerative lumbar spine   . 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 Chronic back pain   . COPD (chronic obstructive pulmonary disease) ([place])    smokes 1/2ppd  . Depression   . GERD (gastroesophageal reflux disease)   . High cholesterol   . History of cerebral aneurysm repair   . Hypertension    states under control with med., has been on med. x "a long time"  . Insulin dependent diabetes mellitus     Past Surgical History:  Procedure Laterality Date  . CARPAL TUNNEL RELEASE Right 03/25/2016   Procedure: RIGHT ENDOSCOPIC CARPAL TUNNEL RELEASE;  Surgeon: Milly Jakob, MD;  Location: [facility];  Service: Orthopedics;  Laterality: Right;  . CARPAL TUNNEL RELEASE Left 04/08/2016   Procedure: LEFT CARPAL TUNNEL RELEASE ENDOSCOPIC;  Surgeon: Milly Jakob, MD;  Location: [facility];  Service: Orthopedics;  Laterality: Left;  . CEREBRAL ANGIOGRAM  07/18/2014  . CRANIOTOMY Right 02/15/2015   Procedure: Right Frontotemporal Craniotomy for aneurysm clipping;  Surgeon: Consuella Lose, MD;  Location: [facility];  Service: Neurosurgery;  Laterality: Right;  right  . INSERTION OF MESH N/A 02/18/2019   Procedure: INSERTION OF MESH;  Surgeon: Armandina Gemma, MD;   Location: [facility];  Service: General;  Laterality: N/A;  . RADIOLOGY WITH ANESTHESIA N/A 09/07/2014   Procedure: Embolization Stent Supported Coil of Aneurysm;  Surgeon: Consuella Lose, MD;  Location: [facility];  Service: Radiology;  Laterality: N/A;  . TUBAL LIGATION    . UMBILICAL HERNIA REPAIR  05/14/1007  . VENTRAL HERNIA REPAIR N/A 02/18/2019   Procedure: VENTRAL HERNIA REPAIR;  Surgeon: Armandina Gemma, MD;  Location: [place];  Service: General;  Laterality: N/A;     Current Outpatient Medications:  .  atorvastatin (LIPITOR) 20 MG tablet, Take 20 mg by mouth daily., Disp: , Rfl:  .  Cyanocobalamin (VITAMIN B-12 IJ), Inject 1,000 mcg as directed every 30 (thirty) days. , Disp: , Rfl:  .  gabapentin (NEURONTIN) 300 MG capsule, Take 1 capsule (300 mg total) by mouth 3 (three) times daily., Disp: 90 capsule, Rfl: 2 .  hydrochlorothiazide (MICROZIDE) 12.5 MG capsule, Take 12.5 mg by mouth daily. , Disp: , Rfl:  .  HYDROcodone-acetaminophen (NORCO/VICODIN) 5-325 MG per tablet, Take 1 tablet by mouth every 6 (six) hours as needed for moderate pain., Disp: , Rfl:  .  HYDROcodone-homatropine (HYCODAN) 5-1.5 MG/5ML syrup, Take 5 mLs by mouth at bedtime., Disp: 35 mL, Rfl: 0 .  insulin aspart (NOVOLOG) 100 UNIT/ML FlexPen, Inject 8 Units into the skin 4 (four) times daily., Disp: , Rfl:  .  Insulin Glargine (LANTUS) 100 UNIT/ML Solostar Pen, Inject 17 Units into the skin at bedtime. , Disp: , Rfl:  .  losartan (COZAAR) 100 MG tablet, Take 100 mg by mouth daily. , Disp: , Rfl:  .  metFORMIN (GLUCOPHAGE) 500 MG tablet, Take 500 mg by mouth 2 (two) times daily with a meal. , Disp: , Rfl:  .  valACYclovir (VALTREX) 1000 MG tablet, Take 1 tablet by mouth 2 (two) times daily as needed., Disp: , Rfl:  .  venlafaxine XR (EFFEXOR-XR) 75 MG 24 hr capsule, Take 75 mg by mouth daily with breakfast., Disp: , Rfl:   Review of Systems Constitutional: negative Gastrointestinal:  negative Genitourinary: negative  Objective:  There were no vitals taken for this visit.   BMI: There is no height or weight on file to calculate BMI.  General Appearance: Alert, appropriate appearance for age. No acute distress HEENT: Grossly normal Breast Exam: Deferred Gastrointestinal: Soft, non-tender, no masses or organomegaly Pelvic Exam: Vulva and vagina appear normal. Bimanual exam reveals normal uterus and adnexa. External genitalia: normal general appearance Vaginal: normal mucosa without prolapse or lesions and normal without tenderness, induration or masses Cervix: normal appearance Adnexa: normal bimanual exam Uterus: normal single, nontender Hemoccult done through PCP. Skin: no rash or abnormalities Neurologic: Normal gait and speech, no tremor  Psychiatric: Alert and oriented, appropriate affect.  Urinalysis: Not done  By signing my name below, I, Nikki Dom, attest that this documentation has been prepared under the direction and in the presence of Tilda Burrow, MD. Electronically Signed: Nikki Dom Medical Scribe. 09/14/19. 9:37 AM.  I personally performed the services described in this documentation, which was SCRIBED in my presence. The recorded information has been reviewed and considered accurate. It has been edited as necessary during review. Tilda Burrow, MD

## 2019-09-14 NOTE — Progress Notes (Signed)
See above note

## 2019-09-15 LAB — CYTOLOGY - PAP
Chlamydia: NEGATIVE
Comment: NEGATIVE
Comment: NEGATIVE
Comment: NORMAL
Diagnosis: NEGATIVE
High risk HPV: NEGATIVE
Neisseria Gonorrhea: NEGATIVE

## 2019-09-16 NOTE — Progress Notes (Signed)
Pap NEGATIVE for Gonorrhea, Chlamydia, and HPV virus.  Repeat in 5 yrs.per asccp guidelines , or earlier prn patient preference.

## 2020-03-07 ENCOUNTER — Other Ambulatory Visit: Payer: Self-pay

## 2020-03-07 ENCOUNTER — Ambulatory Visit
Admission: EM | Admit: 2020-03-07 | Discharge: 2020-03-07 | Disposition: A | Discharge status: Home / Self Care | Payer: Medicare Other | Attending: Emergency Medicine | Admitting: Emergency Medicine

## 2020-03-07 DIAGNOSIS — R200 Anesthesia of skin: Secondary | ICD-10-CM

## 2020-03-07 DIAGNOSIS — R202 Paresthesia of skin: Secondary | ICD-10-CM

## 2020-03-07 MED ORDER — GABAPENTIN 300 MG PO CAPS: 300.0000 mg | ORAL_CAPSULE | Freq: Three times a day (TID) | ORAL | 0 refills | Status: AC

## 2020-03-07 NOTE — ED Triage Notes (Signed)
t presents with tingling in right arm for past few days, denies pain or any other symptoms

## 2020-03-07 NOTE — Discharge Instructions (Signed)
Gabapentin was prescribed/take as directed Please continue to monitor blood pressure at home and keep a log Eat a well balanced diet of fruits, vegetables and lean meats.  Avoid foods high in fat and salt Drink water.  At least half your body weight in ounces Exercise for at least 30 minutes daily Return or go to the ED if you have any new or worsening symptoms such as vision changes, fatigue, dizziness, chest pain, shortness of breath, nausea, swelling in your hands or feet, urinary symptoms, etc..Marland Kitchen

## 2020-03-07 NOTE — ED Provider Notes (Signed)
[facility]   [id number] 03/07/20 Arrival Time: 1550   CC: Right arm tingling  SUBJECTIVE: History from: patient.  [name] is a 64 y.o. female with history of diabetes who presented to the urgent care for complaint of right arm tingling for the past few days.  Denies any precipitating event.  Has tried OTC medication without relief.  Denies aggravating or alleviating factors.  Denies previous symptoms in the past.    Denies fever, chills, decreased appetite, decreased activity, drooling, vomiting, wheezing, rash, changes in bowel or bladder function.     ROS: As per HPI.  All other pertinent ROS negative.     Past Medical History:  Diagnosis Date  . Arthritis    degenerative lumbar spine   . 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 Chronic back pain   . COPD (chronic obstructive pulmonary disease) (HCC)    smokes 1/2ppd  . Depression   . GERD (gastroesophageal reflux disease)   . High cholesterol   . History of cerebral aneurysm repair   . Hypertension    states under control with med., has been on med. x "a long time"  . Insulin dependent diabetes mellitus    Past Surgical History:  Procedure Laterality Date  . CARPAL TUNNEL RELEASE Right 03/25/2016   Procedure: RIGHT ENDOSCOPIC CARPAL TUNNEL RELEASE;  Surgeon: Mack Hook, MD;  Location: [facility];  Service: Orthopedics;  Laterality: Right;  . CARPAL TUNNEL RELEASE Left 04/08/2016   Procedure: LEFT CARPAL TUNNEL RELEASE ENDOSCOPIC;  Surgeon: Mack Hook, MD;  Location: [facility];  Service: Orthopedics;  Laterality: Left;  . CEREBRAL ANGIOGRAM  07/18/2014  . CRANIOTOMY Right 02/15/2015   Procedure: Right Frontotemporal Craniotomy for aneurysm clipping;  Surgeon: Lisbeth Renshaw, MD;  Location: [facility];  Service: Neurosurgery;  Laterality: Right;  right  . INSERTION OF MESH N/A 02/18/2019   Procedure: INSERTION OF MESH;  Surgeon: Darnell Level, MD;  Location:  [facility];  Service: General;  Laterality: N/A;  . RADIOLOGY WITH ANESTHESIA N/A 09/07/2014   Procedure: Embolization Stent Supported Coil of Aneurysm;  Surgeon: Lisbeth Renshaw, MD;  Location: [facility] OR;  Service: Radiology;  Laterality: N/A;  . TUBAL LIGATION    . UMBILICAL HERNIA REPAIR  05/14/1007  . VENTRAL HERNIA REPAIR N/A 02/18/2019   Procedure: VENTRAL HERNIA REPAIR;  Surgeon: Darnell Level, MD;  Location: [facility];  Service: General;  Laterality: N/A;   No Known Allergies No current facility-administered medications on file prior to encounter.   Current Outpatient Medications on File Prior to Encounter  Medication Sig Dispense Refill  . atorvastatin (LIPITOR) 20 MG tablet Take 20 mg by mouth daily.    . Cyanocobalamin (VITAMIN B-12 IJ) Inject 1,000 mcg as directed every 30 (thirty) days.     . hydrochlorothiazide (MICROZIDE) 12.5 MG capsule Take 12.5 mg by mouth daily.     Marland Kitchen HYDROcodone-acetaminophen (NORCO/VICODIN) 5-325 MG per tablet Take 1 tablet by mouth every 6 (six) hours as needed for moderate pain.    Marland Kitchen HYDROcodone-homatropine (HYCODAN) 5-1.5 MG/5ML syrup Take 5 mLs by mouth at bedtime. 35 mL 0  . insulin aspart (NOVOLOG) 100 UNIT/ML FlexPen Inject 8 Units into the skin 4 (four) times daily.    . Insulin Glargine (LANTUS) 100 UNIT/ML Solostar Pen Inject 17 Units into the skin at bedtime.     Marland Kitchen losartan (COZAAR) 100 MG tablet Take 100 mg by mouth daily.     Marland Kitchen  metFORMIN (GLUCOPHAGE) 500 MG tablet Take 500 mg by mouth 2 (two) times daily with a meal.     . valACYclovir (VALTREX) 1000 MG tablet Take 1 tablet by mouth 2 (two) times daily as needed.    . venlafaxine XR (EFFEXOR-XR) 75 MG 24 hr capsule Take 75 mg by mouth daily with breakfast.     Social History   Socioeconomic History  . Marital status: Single    Spouse name: Not on file  . Number of children: 2  . Years of education: 1  . Highest education level: Not on file  Occupational  History  . Occupation: Unemployed  Tobacco Use  . Smoking status: Current Every Day Smoker    Packs/day: 0.50    Years: 40.00    Pack years: 20.00    Types: Cigarettes  . Smokeless tobacco: Never Used  Vaping Use  . Vaping Use: Never used  Substance and Sexual Activity  . Alcohol use: No  . Drug use: No  . Sexual activity: Yes    Birth control/protection: Post-menopausal  Other Topics Concern  . Not on file  Social History Narrative   Lives with boyfriend.   Caffeine use: Drinks coffee/tea (not often per patient)   Social Determinants of Health   Financial Resource Strain:   . Difficulty of Paying Living Expenses: Not on file  Food Insecurity:   . Worried About Programme researcher, broadcasting/film/video in the Last Year: Not on file  . Ran Out of Food in the Last Year: Not on file  Transportation Needs:   . Lack of Transportation (Medical): Not on file  . Lack of Transportation (Non-Medical): Not on file  Physical Activity:   . Days of Exercise per Week: Not on file  . Minutes of Exercise per Session: Not on file  Stress:   . Feeling of Stress : Not on file  Social Connections:   . Frequency of Communication with Friends and Family: Not on file  . Frequency of Social Gatherings with Friends and Family: Not on file  . Attends Religious Services: Not on file  . Active Member of Clubs or Organizations: Not on file  . Attends Banker Meetings: Not on file  . Marital Status: Not on file  Intimate Partner Violence:   . Fear of Current or Ex-Partner: Not on file  . Emotionally Abused: Not on file  . Physically Abused: Not on file  . Sexually Abused: Not on file   Family History  Problem Relation Age of Onset  . Cancer Father   . COPD Mother   . Colon cancer Neg Hx   . Colon polyps Neg Hx     OBJECTIVE:  Vitals:   03/07/20 1715  BP: (!) 167/98  Pulse: 82  Resp: 16  Temp: 98.3 F (36.8 C)  TempSrc: Oral  SpO2: 96%     Physical Exam Vitals and nursing note  reviewed.  Constitutional:      General: She is not in acute distress.    Appearance: Normal appearance. She is normal weight. She is not ill-appearing, toxic-appearing or diaphoretic.  HENT:     Head: Normocephalic.  Cardiovascular:     Rate and Rhythm: Normal rate and regular rhythm.     Pulses: Normal pulses.     Heart sounds: Normal heart sounds. No murmur heard.  No friction rub. No gallop.   Pulmonary:     Effort: Pulmonary effort is normal. No respiratory distress.     Breath sounds:  Normal breath sounds. No stridor. No wheezing, rhonchi or rales.  Chest:     Chest wall: No tenderness.  Neurological:     General: No focal deficit present.     Mental Status: She is alert and oriented to person, place, and time.     GCS: GCS eye subscore is 4. GCS verbal subscore is 5. GCS motor subscore is 6.     Cranial Nerves: Cranial nerves are intact.     Sensory: Sensation is intact.     Motor: Motor function is intact.     Coordination: Coordination is intact.     Gait: Gait is intact.     Deep Tendon Reflexes:     Reflex Scores:      Patellar reflexes are 2+ on the right side.    ASSESSMENT & PLAN:  1. Numbness and tingling of right arm     Meds ordered this encounter  Medications  . gabapentin (NEURONTIN) 300 MG capsule    Sig: Take 1 capsule (300 mg total) by mouth 3 (three) times daily.    Dispense:  60 capsule    Refill:  0     Discharge Instructions  Gabapentin was prescribed/take as directed Please continue to monitor blood pressure at home and keep a log Eat a well balanced diet of fruits, vegetables and lean meats.  Avoid foods high in fat and salt Drink water.  At least half your body weight in ounces Exercise for at least 30 minutes daily Return or go to the ED if you have any new or worsening symptoms such as vision changes, fatigue, dizziness, chest pain, shortness of breath, nausea, swelling in your hands or feet, urinary symptoms, etc...  Reviewed  expectations re: course of current medical issues. Questions answered. Outlined signs and symptoms indicating need for more acute intervention. Patient verbalized understanding. After Visit Summary given.          Durward Parcel, FNP 03/07/20 1831

## 2020-03-20 ENCOUNTER — Emergency Department (HOSPITAL_COMMUNITY): Payer: Medicare Other

## 2020-03-20 ENCOUNTER — Inpatient Hospital Stay (HOSPITAL_COMMUNITY)
Admission: EM | Admit: 2020-03-20 | Discharge: 2020-04-23 | DRG: 871 | Disposition: E | Payer: Medicare Other | Attending: Family Medicine | Admitting: Family Medicine

## 2020-03-20 ENCOUNTER — Other Ambulatory Visit: Payer: Self-pay

## 2020-03-20 ENCOUNTER — Encounter (HOSPITAL_COMMUNITY): Payer: Self-pay | Admitting: *Deleted

## 2020-03-20 DIAGNOSIS — Z809 Family history of malignant neoplasm, unspecified: Secondary | ICD-10-CM | POA: Diagnosis not present

## 2020-03-20 DIAGNOSIS — E1165 Type 2 diabetes mellitus with hyperglycemia: Secondary | ICD-10-CM | POA: Diagnosis present

## 2020-03-20 DIAGNOSIS — Z66 Do not resuscitate: Secondary | ICD-10-CM | POA: Diagnosis not present

## 2020-03-20 DIAGNOSIS — Z720 Tobacco use: Secondary | ICD-10-CM | POA: Diagnosis not present

## 2020-03-20 DIAGNOSIS — M549 Dorsalgia, unspecified: Secondary | ICD-10-CM | POA: Diagnosis present

## 2020-03-20 DIAGNOSIS — Y929 Unspecified place or not applicable: Secondary | ICD-10-CM | POA: Diagnosis not present

## 2020-03-20 DIAGNOSIS — D72819 Decreased white blood cell count, unspecified: Secondary | ICD-10-CM | POA: Diagnosis present

## 2020-03-20 DIAGNOSIS — A4189 Other specified sepsis: Principal | ICD-10-CM | POA: Diagnosis present

## 2020-03-20 DIAGNOSIS — Z825 Family history of asthma and other chronic lower respiratory diseases: Secondary | ICD-10-CM

## 2020-03-20 DIAGNOSIS — E669 Obesity, unspecified: Secondary | ICD-10-CM | POA: Diagnosis present

## 2020-03-20 DIAGNOSIS — R6520 Severe sepsis without septic shock: Secondary | ICD-10-CM | POA: Diagnosis present

## 2020-03-20 DIAGNOSIS — G8929 Other chronic pain: Secondary | ICD-10-CM | POA: Diagnosis present

## 2020-03-20 DIAGNOSIS — D696 Thrombocytopenia, unspecified: Secondary | ICD-10-CM | POA: Diagnosis present

## 2020-03-20 DIAGNOSIS — E1169 Type 2 diabetes mellitus with other specified complication: Secondary | ICD-10-CM

## 2020-03-20 DIAGNOSIS — J1282 Pneumonia due to coronavirus disease 2019: Secondary | ICD-10-CM | POA: Diagnosis present

## 2020-03-20 DIAGNOSIS — Z794 Long term (current) use of insulin: Secondary | ICD-10-CM

## 2020-03-20 DIAGNOSIS — E7800 Pure hypercholesterolemia, unspecified: Secondary | ICD-10-CM | POA: Diagnosis present

## 2020-03-20 DIAGNOSIS — R0689 Other abnormalities of breathing: Secondary | ICD-10-CM

## 2020-03-20 DIAGNOSIS — E11649 Type 2 diabetes mellitus with hypoglycemia without coma: Secondary | ICD-10-CM | POA: Diagnosis not present

## 2020-03-20 DIAGNOSIS — E785 Hyperlipidemia, unspecified: Secondary | ICD-10-CM

## 2020-03-20 DIAGNOSIS — K219 Gastro-esophageal reflux disease without esophagitis: Secondary | ICD-10-CM | POA: Diagnosis present

## 2020-03-20 DIAGNOSIS — Z7984 Long term (current) use of oral hypoglycemic drugs: Secondary | ICD-10-CM

## 2020-03-20 DIAGNOSIS — I10 Essential (primary) hypertension: Secondary | ICD-10-CM | POA: Diagnosis present

## 2020-03-20 DIAGNOSIS — U071 COVID-19: Secondary | ICD-10-CM | POA: Diagnosis present

## 2020-03-20 DIAGNOSIS — F17210 Nicotine dependence, cigarettes, uncomplicated: Secondary | ICD-10-CM | POA: Diagnosis present

## 2020-03-20 DIAGNOSIS — A419 Sepsis, unspecified organism: Secondary | ICD-10-CM

## 2020-03-20 DIAGNOSIS — E872 Acidosis, unspecified: Secondary | ICD-10-CM

## 2020-03-20 DIAGNOSIS — R0600 Dyspnea, unspecified: Secondary | ICD-10-CM

## 2020-03-20 DIAGNOSIS — J440 Chronic obstructive pulmonary disease with acute lower respiratory infection: Secondary | ICD-10-CM | POA: Diagnosis present

## 2020-03-20 DIAGNOSIS — D6859 Other primary thrombophilia: Secondary | ICD-10-CM | POA: Diagnosis not present

## 2020-03-20 DIAGNOSIS — Z515 Encounter for palliative care: Secondary | ICD-10-CM

## 2020-03-20 DIAGNOSIS — E871 Hypo-osmolality and hyponatremia: Secondary | ICD-10-CM | POA: Diagnosis present

## 2020-03-20 DIAGNOSIS — J9600 Acute respiratory failure, unspecified whether with hypoxia or hypercapnia: Secondary | ICD-10-CM

## 2020-03-20 DIAGNOSIS — J9601 Acute respiratory failure with hypoxia: Secondary | ICD-10-CM | POA: Diagnosis present

## 2020-03-20 DIAGNOSIS — Z79899 Other long term (current) drug therapy: Secondary | ICD-10-CM

## 2020-03-20 DIAGNOSIS — Z91128 Patient's intentional underdosing of medication regimen for other reason: Secondary | ICD-10-CM

## 2020-03-20 DIAGNOSIS — T383X6A Underdosing of insulin and oral hypoglycemic [antidiabetic] drugs, initial encounter: Secondary | ICD-10-CM | POA: Diagnosis present

## 2020-03-20 DIAGNOSIS — Z6836 Body mass index (BMI) 36.0-36.9, adult: Secondary | ICD-10-CM

## 2020-03-20 DIAGNOSIS — R230 Cyanosis: Secondary | ICD-10-CM | POA: Diagnosis not present

## 2020-03-20 DIAGNOSIS — Z7189 Other specified counseling: Secondary | ICD-10-CM

## 2020-03-20 LAB — CBC WITH DIFFERENTIAL/PLATELET
Abs Immature Granulocytes: 0.01 10*3/uL (ref 0.00–0.07)
Basophils Absolute: 0 10*3/uL (ref 0.0–0.1)
Basophils Relative: 1 %
Eosinophils Absolute: 0 10*3/uL (ref 0.0–0.5)
Eosinophils Relative: 0 %
HCT: 44.1 % (ref 36.0–46.0)
Hemoglobin: 14.6 g/dL (ref 12.0–15.0)
Immature Granulocytes: 0 %
Lymphocytes Relative: 19 %
Lymphs Abs: 0.8 10*3/uL (ref 0.7–4.0)
MCH: 28.4 pg (ref 26.0–34.0)
MCHC: 33.1 g/dL (ref 30.0–36.0)
MCV: 85.8 fL (ref 80.0–100.0)
Monocytes Absolute: 0.4 10*3/uL (ref 0.1–1.0)
Monocytes Relative: 9 %
Neutro Abs: 2.8 10*3/uL (ref 1.7–7.7)
Neutrophils Relative %: 71 %
Platelets: 141 10*3/uL — ABNORMAL LOW (ref 150–400)
RBC: 5.14 MIL/uL — ABNORMAL HIGH (ref 3.87–5.11)
RDW: 13.2 % (ref 11.5–15.5)
WBC: 3.9 10*3/uL — ABNORMAL LOW (ref 4.0–10.5)
nRBC: 0 % (ref 0.0–0.2)

## 2020-03-20 LAB — COMPREHENSIVE METABOLIC PANEL
ALT: 27 U/L (ref 0–44)
AST: 50 U/L — ABNORMAL HIGH (ref 15–41)
Albumin: 3.4 g/dL — ABNORMAL LOW (ref 3.5–5.0)
Alkaline Phosphatase: 70 U/L (ref 38–126)
Anion gap: 15 (ref 5–15)
BUN: 20 mg/dL (ref 8–23)
CO2: 20 mmol/L — ABNORMAL LOW (ref 22–32)
Calcium: 8.4 mg/dL — ABNORMAL LOW (ref 8.9–10.3)
Chloride: 97 mmol/L — ABNORMAL LOW (ref 98–111)
Creatinine, Ser: 0.99 mg/dL (ref 0.44–1.00)
GFR calc Af Amer: >60 mL/min (ref >60)
GFR calc non Af Amer: >60 mL/min (ref >60)
Glucose, Bld: 271 mg/dL — ABNORMAL HIGH (ref 70–99)
Potassium: 4 mmol/L (ref 3.5–5.1)
Sodium: 132 mmol/L — ABNORMAL LOW (ref 135–145)
Total Bilirubin: 0.7 mg/dL (ref 0.3–1.2)
Total Protein: 7.9 g/dL (ref 6.5–8.1)

## 2020-03-20 LAB — RESPIRATORY PANEL BY RT PCR (FLU A&B, COVID)
Influenza A by PCR: NEGATIVE
Influenza B by PCR: NEGATIVE
SARS Coronavirus 2 by RT PCR: POSITIVE — AB

## 2020-03-20 LAB — FERRITIN: Ferritin: 595 ng/mL — ABNORMAL HIGH (ref 11–307)

## 2020-03-20 LAB — C-REACTIVE PROTEIN: CRP: 5.4 mg/dL — ABNORMAL HIGH (ref <1.0)

## 2020-03-20 LAB — CBG MONITORING, ED
Glucose-Capillary: 265 mg/dL — ABNORMAL HIGH (ref 70–99)
Glucose-Capillary: 249 mg/dL — ABNORMAL HIGH (ref 70–99)

## 2020-03-20 LAB — LACTIC ACID, PLASMA
Lactic Acid, Venous: 2.3 mmol/L (ref 0.5–1.9)
Lactic Acid, Venous: 1.8 mmol/L (ref 0.5–1.9)

## 2020-03-20 LAB — TRIGLYCERIDES: Triglycerides: 123 mg/dL (ref <150)

## 2020-03-20 LAB — LACTATE DEHYDROGENASE: LDH: 422 U/L — ABNORMAL HIGH (ref 98–192)

## 2020-03-20 LAB — PROCALCITONIN: Procalcitonin: <0.1 ng/mL

## 2020-03-20 LAB — FIBRINOGEN: Fibrinogen: 541 mg/dL — ABNORMAL HIGH (ref 210–475)

## 2020-03-20 LAB — D-DIMER, QUANTITATIVE: D-Dimer, Quant: 1.15 ug/mL-FEU — ABNORMAL HIGH (ref 0.00–0.50)

## 2020-03-20 MED ORDER — SODIUM CHLORIDE 0.9 % IV SOLN: 200.0000 mg | Freq: Once | INTRAVENOUS | Status: DC

## 2020-03-20 MED ORDER — SODIUM CHLORIDE 0.9 % IV SOLN: 100.0000 mg | Freq: Every day | INTRAVENOUS | Status: DC

## 2020-03-20 MED ORDER — SODIUM CHLORIDE 0.9 % IV SOLN
100.0000 mg | Freq: Every day | INTRAVENOUS | Status: DC
  Administered 2020-03-20 – 2020-03-21 (×2): 100 mg via INTRAVENOUS
  Filled 2020-03-20: qty 20

## 2020-03-20 MED ORDER — ATORVASTATIN CALCIUM 10 MG PO TABS
20.0000 mg | ORAL_TABLET | Freq: Every day | ORAL | Status: DC
  Administered 2020-03-21 – 2020-03-22 (×2): 20 mg via ORAL
  Filled 2020-03-20 (×2): qty 2

## 2020-03-20 MED ORDER — ONDANSETRON HCL 4 MG/2ML IJ SOLN: 4.0000 mg | Freq: Four times a day (QID) | INTRAMUSCULAR | Status: DC | PRN

## 2020-03-20 MED ORDER — NICOTINE 21 MG/24HR TD PT24
21.0000 mg | MEDICATED_PATCH | Freq: Every day | TRANSDERMAL | Status: DC
  Administered 2020-03-20 – 2020-04-04 (×16): 21 mg via TRANSDERMAL
  Filled 2020-03-20 (×16): qty 1

## 2020-03-20 MED ORDER — METHYLPREDNISOLONE SODIUM SUCC 125 MG IJ SOLR
0.5000 mg/kg | Freq: Two times a day (BID) | INTRAMUSCULAR | Status: DC
  Administered 2020-03-20 – 2020-03-22 (×4): 45.625 mg via INTRAVENOUS
  Filled 2020-03-20 (×4): qty 2

## 2020-03-20 MED ORDER — GABAPENTIN 300 MG PO CAPS
300.0000 mg | ORAL_CAPSULE | Freq: Three times a day (TID) | ORAL | Status: DC
  Administered 2020-03-20 – 2020-04-01 (×37): 300 mg via ORAL
  Filled 2020-03-20 (×39): qty 1

## 2020-03-20 MED ORDER — ALBUTEROL SULFATE HFA 108 (90 BASE) MCG/ACT IN AERS
2.0000 | INHALATION_SPRAY | Freq: Four times a day (QID) | RESPIRATORY_TRACT | Status: DC
  Administered 2020-03-20 – 2020-03-28 (×29): 2 via RESPIRATORY_TRACT
  Filled 2020-03-20 (×3): qty 6.7

## 2020-03-20 MED ORDER — PREDNISONE 50 MG PO TABS: 50.0000 mg | ORAL_TABLET | Freq: Every day | ORAL | Status: DC

## 2020-03-20 MED ORDER — SODIUM CHLORIDE 0.9 % IV BOLUS
500.0000 mL | Freq: Once | INTRAVENOUS | Status: AC
  Administered 2020-03-20: 500 mL via INTRAVENOUS

## 2020-03-20 MED ORDER — GUAIFENESIN-DM 100-10 MG/5ML PO SYRP
10.0000 mL | ORAL_SOLUTION | ORAL | Status: DC | PRN
  Administered 2020-03-21 – 2020-04-03 (×10): 10 mL via ORAL
  Filled 2020-03-20 (×11): qty 10

## 2020-03-20 MED ORDER — INSULIN ASPART 100 UNIT/ML ~~LOC~~ SOLN
0.0000 [IU] | Freq: Three times a day (TID) | SUBCUTANEOUS | Status: DC
  Administered 2020-03-21: 15 [IU] via SUBCUTANEOUS
  Administered 2020-03-21 – 2020-03-22 (×3): 11 [IU] via SUBCUTANEOUS
  Filled 2020-03-20 (×4): qty 1

## 2020-03-20 MED ORDER — PANTOPRAZOLE SODIUM 40 MG IV SOLR
40.0000 mg | Freq: Every day | INTRAVENOUS | Status: DC
  Administered 2020-03-20 – 2020-03-21 (×2): 40 mg via INTRAVENOUS
  Filled 2020-03-20 (×2): qty 40

## 2020-03-20 MED ORDER — ACETAMINOPHEN 325 MG PO TABS
650.0000 mg | ORAL_TABLET | Freq: Four times a day (QID) | ORAL | Status: DC | PRN
  Administered 2020-03-21: 650 mg via ORAL
  Filled 2020-03-20 (×2): qty 2

## 2020-03-20 MED ORDER — SODIUM CHLORIDE 0.9 % IV SOLN
100.0000 mg | Freq: Every day | INTRAVENOUS | Status: AC
  Administered 2020-03-21 – 2020-03-24 (×4): 100 mg via INTRAVENOUS
  Filled 2020-03-20 (×4): qty 20

## 2020-03-20 MED ORDER — ASCORBIC ACID 500 MG PO TABS
500.0000 mg | ORAL_TABLET | Freq: Every day | ORAL | Status: DC
  Administered 2020-03-21 – 2020-04-01 (×12): 500 mg via ORAL
  Filled 2020-03-20 (×13): qty 1

## 2020-03-20 MED ORDER — SODIUM CHLORIDE 0.9 % IV SOLN: 100.0000 mg | Freq: Once | INTRAVENOUS | Status: DC

## 2020-03-20 MED ORDER — HYDROCOD POLST-CPM POLST ER 10-8 MG/5ML PO SUER
5.0000 mL | Freq: Two times a day (BID) | ORAL | Status: DC | PRN
  Administered 2020-03-21 – 2020-03-28 (×10): 5 mL via ORAL
  Filled 2020-03-20 (×10): qty 5

## 2020-03-20 MED ORDER — ZINC SULFATE 220 (50 ZN) MG PO CAPS
220.0000 mg | ORAL_CAPSULE | Freq: Every day | ORAL | Status: DC
  Administered 2020-03-21 – 2020-04-01 (×12): 220 mg via ORAL
  Filled 2020-03-20 (×13): qty 1

## 2020-03-20 MED ORDER — SODIUM CHLORIDE 0.9 % IV SOLN
100.0000 mg | Freq: Once | INTRAVENOUS | Status: AC
  Administered 2020-03-20: 100 mg via INTRAVENOUS

## 2020-03-20 MED ORDER — ONDANSETRON HCL 4 MG PO TABS
4.0000 mg | ORAL_TABLET | Freq: Four times a day (QID) | ORAL | Status: DC | PRN
  Administered 2020-03-24: 4 mg via ORAL
  Filled 2020-03-20: qty 1

## 2020-03-20 MED ORDER — LOSARTAN POTASSIUM 25 MG PO TABS
100.0000 mg | ORAL_TABLET | Freq: Every day | ORAL | Status: DC
  Administered 2020-03-21 – 2020-03-22 (×2): 100 mg via ORAL
  Filled 2020-03-20 (×2): qty 4

## 2020-03-20 MED ORDER — ENOXAPARIN SODIUM 40 MG/0.4ML ~~LOC~~ SOLN
40.0000 mg | SUBCUTANEOUS | Status: DC
  Administered 2020-03-21: 40 mg via SUBCUTANEOUS
  Filled 2020-03-20: qty 0.4

## 2020-03-20 MED ORDER — INSULIN ASPART 100 UNIT/ML ~~LOC~~ SOLN
0.0000 [IU] | Freq: Every day | SUBCUTANEOUS | Status: DC
  Administered 2020-03-20: 2 [IU] via SUBCUTANEOUS
  Administered 2020-03-21: 4 [IU] via SUBCUTANEOUS
  Filled 2020-03-20 (×2): qty 1

## 2020-03-20 NOTE — Progress Notes (Signed)
Patient was not keeping NRB on so RN approached me to see what we could do.  I suggested placing patient on [place].  Placed her on 15L.  Sat is now 94%.  Will continue to monitor.

## 2020-03-20 NOTE — ED Provider Notes (Signed)
[facility]ENCY [facility]PART[facility] Provider Note   CSN: [id number] Arrival date & time: 02/22/2020  1627     History No chief complaint on file.   [name] is a 64 y.o. female.  HPI      [name] is a 64 y.o. female with history of HTN, insulin dependant diabetes, COPD who presents to the Emergency Department via EMS complaining of generalized weakness, decreased appetite, cough, and fatigue.  Symptoms have been present for 2 weeks.  Weakness worse today which prompted her ER visit.  Describes her cough as productive of colored sputum.  No chest pain or shortness of breath.  Blood sugars having been reading "high" per patient. Did not take her insulin yesterday or today.  Denies fever, chills, abdominal pain, vomiting or diarrhea.  No known Covid exposures and states that she mostly stays at home.  She has not been vaccinated for the covid virus. Reports hx of similar symptoms when previously diagnosed with PNA.  She is a smoker and does not have a supplemental oxygen requirement.    Past Medical History:  Diagnosis Date  . Arthritis    degenerative lumbar spine   . 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 Chronic back pain   . COPD (chronic obstructive pulmonary disease) (HCC)    smokes 1/2ppd  . Depression   . GERD (gastroesophageal reflux disease)   . High cholesterol   . History of cerebral aneurysm repair   . Hypertension    states under control with med., has been on med. x "a long time"  . Insulin dependent diabetes mellitus     Patient Active Problem List   Diagnosis Date Noted  . Ventral hernia 02/13/2019  . Encounter for screening colonoscopy 12/22/2018  . Abdominal mass 12/22/2018  . Neuralgia involving scalp 02/09/2016  . Cerebral aneurysm, nonruptured 02/15/2015  . Cerebral aneurysm 09/07/2014    Past Surgical History:  Procedure Laterality Date  . CARPAL TUNNEL RELEASE Right 03/25/2016   Procedure: RIGHT ENDOSCOPIC CARPAL TUNNEL  RELEASE;  Surgeon: Mack Hookavid Thompson, MD;  Location: [facility];  Service: Orthopedics;  Laterality: Right;  . CARPAL TUNNEL RELEASE Left 04/08/2016   Procedure: LEFT CARPAL TUNNEL RELEASE ENDOSCOPIC;  Surgeon: Mack Hookavid Thompson, MD;  Location: [facility];  Service: Orthopedics;  Laterality: Left;  . CEREBRAL ANGIOGRAM  07/18/2014  . CRANIOTOMY Right 02/15/2015   Procedure: Right Frontotemporal Craniotomy for aneurysm clipping;  Surgeon: Lisbeth RenshawNeelesh Nundkumar, MD;  Location: [facility];  Service: Neurosurgery;  Laterality: Right;  right  . INSERTION OF MESH N/A 02/18/2019   Procedure: INSERTION OF MESH;  Surgeon: Darnell LevelGerkin, Todd, MD;  Location: [facility];  Service: General;  Laterality: N/A;  . RADIOLOGY WITH ANESTHESIA N/A 09/07/2014   Procedure: Embolization Stent Supported Coil of Aneurysm;  Surgeon: Lisbeth RenshawNeelesh Nundkumar, MD;  [facility];  Service: Radiology;  Laterality: N/A;  . TUBAL LIGATION    . UMBILICAL HERNIA REPAIR  05/14/1007  . VENTRAL HERNIA REPAIR N/A 02/18/2019   Procedure: VENTRAL HERNIA REPAIR;  Surgeon: Darnell LevelGerkin, Todd, MD;  Location:  SURGERY CENTER;  Service: General;  Laterality: N/A;     OB History    Gravida  2   Para  2   Term  2   Preterm      AB      Living        SAB      TAB      Ectopic  Multiple      Live Births              Family History  Problem Relation Age of Onset  . Cancer Father   . COPD Mother   . Colon cancer Neg Hx   . Colon polyps Neg Hx     Social History   Tobacco Use  . Smoking status: Current Every Day Smoker    Packs/day: 0.50    Years: 40.00    Pack years: 20.00    Types: Cigarettes  . Smokeless tobacco: Never Used  Vaping Use  . Vaping Use: Never used  Substance Use Topics  . Alcohol use: No  . Drug use: No    Home Medications Prior to Admission medications   Medication Sig Start Date End Date Taking? Authorizing Provider  atorvastatin (LIPITOR) 20 MG  tablet Take 20 mg by mouth daily.    [provider]  Cyanocobalamin (VITAMIN B-12 IJ) Inject 1,000 mcg as directed every 30 (thirty) days.     [provider]  gabapentin (NEURONTIN) 300 MG capsule Take 1 capsule (300 mg total) by mouth 3 (three) times daily. 03/07/20   Avegno, Zachery Dakins, FNP  hydrochlorothiazide (MICROZIDE) 12.5 MG capsule Take 12.5 mg by mouth daily.  12/30/18   [provider]  HYDROcodone-acetaminophen (NORCO/VICODIN) 5-325 MG per tablet Take 1 tablet by mouth every 6 (six) hours as needed for moderate pain.    [provider]  HYDROcodone-homatropine (HYCODAN) 5-1.5 MG/5ML syrup Take 5 mLs by mouth at bedtime. 05/05/19   Gilda Crease, MD  insulin aspart (NOVOLOG) 100 UNIT/ML FlexPen Inject 8 Units into the skin 4 (four) times daily.    [provider]  Insulin Glargine (LANTUS) 100 UNIT/ML Solostar Pen Inject 17 Units into the skin at bedtime.     [provider]  losartan (COZAAR) 100 MG tablet Take 100 mg by mouth daily.  12/30/18   [provider]  metFORMIN (GLUCOPHAGE) 500 MG tablet Take 500 mg by mouth 2 (two) times daily with a meal.     [provider]  valACYclovir (VALTREX) 1000 MG tablet Take 1 tablet by mouth 2 (two) times daily as needed. 12/30/18   [provider]  venlafaxine XR (EFFEXOR-XR) 75 MG 24 hr capsule Take 75 mg by mouth daily with breakfast.    [provider]    Allergies    Patient has no known allergies.  Review of Systems   Review of Systems  Constitutional: Positive for appetite change and fatigue. Negative for chills and fever.  HENT: Negative for congestion, sore throat and trouble swallowing.   Eyes: Negative for visual disturbance.  Respiratory: Positive for cough. Negative for chest tightness, shortness of breath and wheezing.   Cardiovascular: Negative for chest pain and leg swelling.  Gastrointestinal: Negative for abdominal pain, diarrhea,  nausea and vomiting.  Genitourinary: Negative for decreased urine volume and dysuria.  Musculoskeletal: Negative for arthralgias, back pain, neck pain and neck stiffness.  Skin: Negative for rash.  Neurological: Positive for weakness (generalized weakness). Negative for dizziness, syncope, speech difficulty, numbness and headaches.  Hematological: Negative for adenopathy.    Physical Exam Updated Vital Signs BP 100/60   Pulse 76   Temp (!) 97.5 F (36.4 C) (Oral)   Resp 20   Ht 5\' 3"  (1.6 m)   Wt 90.9 kg   SpO2 (!) 86%   BMI 35.48 kg/m   Physical Exam Vitals and nursing note reviewed.  Constitutional:  Appearance: She is not ill-appearing or toxic-appearing.  HENT:     Mouth/Throat:     Comments: Mucous membranes are dry.   Eyes:     Extraocular Movements: Extraocular movements intact.     Conjunctiva/sclera: Conjunctivae normal.     Pupils: Pupils are equal, round, and reactive to light.  Cardiovascular:     Rate and Rhythm: Normal rate and regular rhythm.     Pulses: Normal pulses.  Pulmonary:     Effort: Pulmonary effort is normal. No respiratory distress.     Comments: Scattered crackles on right, mostly at base.  No wheezing.  Pt on 4L O2 by [place].  No increased work of breathing.  Abdominal:     General: There is no distension.     Palpations: Abdomen is soft.     Tenderness: There is no abdominal tenderness. There is no guarding.  Musculoskeletal:     Cervical back: No tenderness.     Right lower leg: No edema.     Left lower leg: No edema.  Lymphadenopathy:     Cervical: No cervical adenopathy.  Skin:    General: Skin is warm.     Capillary Refill: Capillary refill takes less than 2 seconds.     Findings: No rash.  Neurological:     General: No focal deficit present.     Mental Status: She is alert.     Sensory: Sensation is intact. No sensory deficit.     Motor: Motor function is intact. No weakness.     Coordination: Coordination is intact.      Comments: CN II-XII intact.  Speech clear.  No pronator drift. mentating well.        ED Results / Procedures / Treatments   Labs (all labs ordered are listed, but only abnormal results are displayed) Labs Reviewed  RESPIRATORY PANEL BY RT PCR (FLU A&B, COVID) - Abnormal; Notable for the following components:      Result Value   SARS Coronavirus 2 by RT PCR POSITIVE (*)    All other components within normal limits  COMPREHENSIVE METABOLIC PANEL - Abnormal; Notable for the following components:   Sodium 132 (*)    Chloride 97 (*)    CO2 20 (*)    Glucose, Bld 271 (*)    Calcium 8.4 (*)    Albumin 3.4 (*)    AST 50 (*)    All other components within normal limits  CBC WITH DIFFERENTIAL/PLATELET - Abnormal; Notable for the following components:   WBC 3.9 (*)    RBC 5.14 (*)    Platelets 141 (*)    All other components within normal limits  CBG MONITORING, ED - Abnormal; Notable for the following components:   Glucose-Capillary 265 (*)    All other components within normal limits  CULTURE, BLOOD (ROUTINE X 2)  CULTURE, BLOOD (ROUTINE X 2)  LACTIC ACID, PLASMA  LACTIC ACID, PLASMA  D-DIMER, QUANTITATIVE (NOT AT [facility])  PROCALCITONIN  LACTATE DEHYDROGENASE  FERRITIN  TRIGLYCERIDES  FIBRINOGEN  C-REACTIVE PROTEIN    EKG EKG Interpretation  Date/Time:  Tuesday March 20 2020 17:05:42 EDT Ventricular Rate:  74 PR Interval:    QRS Duration: 99 QT Interval:  405 QTC Calculation: 450 R Axis:   5 Text Interpretation: Sinus rhythm Low voltage, precordial leads Confirmed by Vanetta Mulders [phone]) on 02/27/2020 5:11:03 PM   Radiology No results found.  Procedures Procedures (including critical care time)  Medications Ordered in ED Medications  sodium chloride 0.9 %  bolus 500 mL (has no administration in time range)    ED Course  I have reviewed the triage vital signs and the nursing notes.  Pertinent labs & imaging results that were available during my care of  the patient were reviewed by me and considered in my medical decision making (see chart for details).    CRITICAL CARE Performed by: Vinal Rosengrant Total critical care time: 40  minutes Critical care time was exclusive of separately billable procedures and treating other patients. Critical care was necessary to treat or prevent imminent or life-threatening deterioration. Critical care was time spent personally by me on the following activities: development of treatment plan with patient and/or surrogate as well as nursing, discussions with consultants, evaluation of patient's response to treatment, examination of patient, obtaining history from patient or surrogate, ordering and performing treatments and interventions, ordering and review of laboratory studies, ordering and review of radiographic studies, pulse oximetry and re-evaluation of patient's condition.  MDM Rules/Calculators/A&P                          Patient here with 2-week history of generalized weakness and fatigue.  Cough, no fever or chills, denies chest pain or shortness of breath.  On exam, patient does have some crackles on the right.  No increased work of breathing while on oxygen.  She is hypoxic with O2 sats in the mid 80s on 5 L by nasal cannula.  Patient will be placed on high flow oxygen.  No hypotension and she is afebrile.  Mildly hyperglycemic.  She appears mildly dehydrated, so IV normal saline 500 cc bolus ordered.  Will limit IVF's at this point due to potential for Covid.  Differential at this point would include Covid, PNA, COPD exacerbation.  DKA also considered but felt to be less likely.  1840  On recheck, pt now on non rebreather at 10L and sats in low 90's.  Resting comfortably.  She does not appear critically ill  1850  Covid+  Remaining labs show leukopenia, blood glucose 271, nml anion gap, mild hyponatremia and bicarb of 20.  EKG reassuring.  Lactic pending.  Her CXR results are still pending, but appears  c/w a multifocal pneumonia on my interpretation.   She will need admission for her respiratory failure.    1915  Consulted hospitalist, Dr. Thomes Dinning who agrees to admit,  remdesivir ordered.     Final Clinical Impression(s) / ED Diagnoses Final diagnoses:  Acute hypoxemic respiratory failure due to COVID-19 [facility])    Rx / DC Orders ED Discharge Orders    None       Pauline Aus, PA-C 03/01/2020 1948    Vanetta Mulders, MD 03/30/20 1557

## 2020-03-20 NOTE — ED Notes (Signed)
Placed on non-rebreather

## 2020-03-20 NOTE — H&P (Signed)
History and Physical  Tina Richesancy J Lockridge XBJ:[id number] DOB: 11/24/1955 DOA: 03/17/2020  Referring physician: Pauline Ausriplett, Tammy, PA-C PCP: Pearson GrippeKim, James, MD  Patient coming from: home  Chief Complaint: Generalized weakness  HPI: [name] is a 64 y.o. female with medical history significant for hypertension, type 2 DM, hyperlipidemia, obesity and tobacco abuse who presents to the emergency department via EMS due to 2-week onset of worsening generalized weakness. Patient complained of decreased appetite without loss of taste or smell since last 2 weeks with increased weakness, she complained of cough with occasional clear mucus secretion as well as increasing shortness of breath. She states that symptoms were similar to last time she was diagnosed with pneumonia. She states that she did not take her insulin yesterday or today due to poor appetite, she denies fever, chills, nausea, vomiting or abdominal pain. Patient denies any known sick contact. She activated EMS today due to worsening weakness. Patient states that she has not smoked cigarettes in about a week. She has not had any Covid vaccine.  ED Course:  In the emergency department, she was tachypneic, temperature was 97.34F and she was hypoxic with an O2 sat of 86% on supplemental oxygen via [place] at 4 PM, so she was placed on NRB at 15 L with O2 sat at 92%. Work-up in the ED showed leukopenia, thrombocytopenia, hyperglycemia, mild hyponatremia, AST 50. SARS coronavirus 2 was positive. Lactic acid 2.3. Chest x-ray showed  moderate severity bilateral multifocal infiltrates. Hospitalist was asked to admit. For further evaluation and management.  Review of Systems: Constitutional: Positive for decreased appetite and fatigue. Negative for chills and fever.  HENT: Negative for ear pain and sore throat.   Eyes: Negative for pain and visual disturbance.  Respiratory: Positive for cough with occasional production of clear mucus,  negative for chest  tightness Cardiovascular: Negative for chest pain and palpitations.  Gastrointestinal: Negative for abdominal pain and vomiting.  Endocrine: Negative for polyphagia and polyuria.  Genitourinary: Negative for decreased urine volume, dysuria, enuresis Musculoskeletal: Negative for arthralgias and back pain.  Skin: Negative for color change and rash.  Allergic/Immunologic: Negative for immunocompromised state.  Neurological: Positive for generalized weakness. Negative for tremors, syncope, speech difficulty, light-headedness and headaches.  Hematological: Does not bruise/bleed easily.  All other systems reviewed and are negative    Past Medical History:  Diagnosis Date  . Arthritis    degenerative lumbar spine   . 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 Chronic back pain   . COPD (chronic obstructive pulmonary disease) (HCC)    smokes 1/2ppd  . Depression   . GERD (gastroesophageal reflux disease)   . High cholesterol   . History of cerebral aneurysm repair   . Hypertension    states under control with med., has been on med. x "a long time"  . Insulin dependent diabetes mellitus    Past Surgical History:  Procedure Laterality Date  . CARPAL TUNNEL RELEASE Right 03/25/2016   Procedure: RIGHT ENDOSCOPIC CARPAL TUNNEL RELEASE;  Surgeon: Mack Hookavid Thompson, MD;  Location: [facility];  Service: Orthopedics;  Laterality: Right;  . CARPAL TUNNEL RELEASE Left 04/08/2016   Procedure: LEFT CARPAL TUNNEL RELEASE ENDOSCOPIC;  Surgeon: Mack Hookavid Thompson, MD;  Location: [facility];  Service: Orthopedics;  Laterality: Left;  . CEREBRAL ANGIOGRAM  07/18/2014  . CRANIOTOMY Right 02/15/2015   Procedure: Right Frontotemporal Craniotomy for aneurysm clipping;  Surgeon: Lisbeth RenshawNeelesh Nundkumar, MD;  Location: [facility];  Service: Neurosurgery;  Laterality:  Right;  right  . INSERTION OF MESH N/A 02/18/2019   Procedure: INSERTION OF MESH;  Surgeon: Darnell Level, MD;  Location:  [facility];  Service: General;  Laterality: N/A;  . RADIOLOGY WITH ANESTHESIA N/A 09/07/2014   Procedure: Embolization Stent Supported Coil of Aneurysm;  Surgeon: Lisbeth Renshaw, MD;  Location: [facility];  Service: Radiology;  Laterality: N/A;  . TUBAL LIGATION    . UMBILICAL HERNIA REPAIR  05/14/1007  . VENTRAL HERNIA REPAIR N/A 02/18/2019   Procedure: VENTRAL HERNIA REPAIR;  Surgeon: Darnell Level, MD;  Location: [facility];  Service: General;  Laterality: N/A;    Social History:  reports that she has been smoking cigarettes. She has a 20.00 pack-year smoking history. She has never used smokeless tobacco. She reports that she does not drink alcohol and does not use drugs.   No Known Allergies  Family History  Problem Relation Age of Onset  . Cancer Father   . COPD Mother   . Colon cancer Neg Hx   . Colon polyps Neg Hx     Prior to Admission medications   Medication Sig Start Date End Date Taking? Authorizing Provider  atorvastatin (LIPITOR) 20 MG tablet Take 20 mg by mouth daily.    [provider]  Cyanocobalamin (VITAMIN B-12 IJ) Inject 1,000 mcg as directed every 30 (thirty) days.     [provider]  gabapentin (NEURONTIN) 300 MG capsule Take 1 capsule (300 mg total) by mouth 3 (three) times daily. 03/07/20   Avegno, Zachery Dakins, FNP  hydrochlorothiazide (MICROZIDE) 12.5 MG capsule Take 12.5 mg by mouth daily.  12/30/18   [provider]  HYDROcodone-acetaminophen (NORCO/VICODIN) 5-325 MG per tablet Take 1 tablet by mouth every 6 (six) hours as needed for moderate pain.    [provider]  HYDROcodone-homatropine (HYCODAN) 5-1.5 MG/5ML syrup Take 5 mLs by mouth at bedtime. 05/05/19   Gilda Crease, MD  insulin aspart (NOVOLOG) 100 UNIT/ML FlexPen Inject 8 Units into the skin 4 (four) times daily.    [provider]  Insulin Glargine (LANTUS) 100 UNIT/ML Solostar Pen Inject 17 Units into the skin at  bedtime.     [provider]  losartan (COZAAR) 100 MG tablet Take 100 mg by mouth daily.  12/30/18   [provider]  metFORMIN (GLUCOPHAGE) 500 MG tablet Take 500 mg by mouth 2 (two) times daily with a meal.     [provider]  valACYclovir (VALTREX) 1000 MG tablet Take 1 tablet by mouth 2 (two) times daily as needed. 12/30/18   [provider]  venlafaxine XR (EFFEXOR-XR) 75 MG 24 hr capsule Take 75 mg by mouth daily with breakfast.    [provider]    Physical Exam: BP (!) 144/92   Pulse 79   Temp (!) 97.5 F (36.4 C) (Oral)   Resp (!) 29   Ht 5\' 3"  (1.6 m)   Wt 90.9 kg   SpO2 92%   BMI 35.48 kg/m   . General: 64 y.o. year-old female well developed well nourished in no acute distress.  Alert and oriented x3. 65 HEENT: NCAT, EOMI . Neck: Supple, trachea midline . Cardiovascular: Regular rate and rhythm with no rubs or gallops.  No thyromegaly or JVD noted.  No lower extremity edema. 2/4 pulses in all 4 extremities. Marland Kitchen Respiratory: Bilateral crackles in lower lobes on auscultation. No wheezes or rales. Abdomen: Soft nontender nondistended with normal bowel sounds x4 quadrants. . Muskuloskeletal:  No cyanosis, clubbing or edema noted bilaterally . Neuro: CN II-XII intact, strength, sensation, reflexes . Skin: No ulcerative lesions noted or rashes . Psychiatry: Judgement and insight appear normal. Mood is appropriate for condition and setting          Labs on Admission:  Basic Metabolic Panel: Recent Labs  Lab 04-18-2020 1711  NA 132*  K 4.0  CL 97*  CO2 20*  GLUCOSE 271*  BUN 20  CREATININE 0.99  CALCIUM 8.4*   Liver Function Tests: Recent Labs  Lab 04-18-20 1711  AST 50*  ALT 27  ALKPHOS 70  BILITOT 0.7  PROT 7.9  ALBUMIN 3.4*   No results for input(s): LIPASE, AMYLASE in the last 168 hours. No results for input(s): AMMONIA in the last 168 hours. CBC: Recent Labs  Lab 18-Apr-2020 1711  WBC 3.9*  NEUTROABS 2.8  HGB  14.6  HCT 44.1  MCV 85.8  PLT 141*   Cardiac Enzymes: No results for input(s): CKTOTAL, CKMB, CKMBINDEX, TROPONINI in the last 168 hours.  BNP (last 3 results) No results for input(s): BNP in the last 8760 hours.  ProBNP (last 3 results) No results for input(s): PROBNP in the last 8760 hours.  CBG: Recent Labs  Lab Apr 18, 2020 1647  GLUCAP 265*    Radiological Exams on Admission: No results found.  EKG: I independently viewed the EKG done and my findings are as followed: Sinus rhythm at rate of 74 bpm  Assessment/Plan Present on Admission: . Pneumonia due to COVID-19 virus  Principal Problem:   Pneumonia due to COVID-19 virus Active Problems:   Acute respiratory failure with hypoxia (HCC)   Severe sepsis (HCC)   Leukopenia   Thrombocytopenia (HCC)   Hyperglycemia due to diabetes mellitus (HCC)   Essential hypertension   Type 2 diabetes mellitus with hyperlipidemia (HCC)   Obesity (BMI 30-39.9)   Tobacco abuse   Acute respiratory failure with hypoxia secondary to severe sepsis due to COVID-19 viral pneumonia Patient presents with leukopenia (WBC < 4) and tachypnea (meets SIRS criteria), she also presents with respiratory failure with hypoxia due to requirement of supplemental oxygen with COVID-19 viral pneumonia (patient does not use oxygen at baseline). She also presents with lactic acidosis thereby meeting criteria for severe sepsis. Chest x-ray showed moderate severity bilateral multifocal infiltrates.  Procalcitonin will be checked to rule out any bacterial component of infection Continue albuterol q.6h Continue IV Solu-Medrol per pharmacy dosing Continue IV Remdesivir per pharmacy protocol Continue vitamin-C 500 mg p.o. Daily Continue zinc 220 mg p.o. Daily Continue Mucinex, Robitussin and Tussionex Continue Tylenol p.r.n. for fever Continue supplemental oxygen to maintain O2 sat > or = 94% with plan to wean patient off supplemental oxygen as tolerated (of  note, patient does not use oxygen at baseline) Continue incentive spirometry and flutter valve q90min as tolerated Encourage proning, early ambulation, and side laying as tolerated Continue airborne isolation precaution Continue monitoring daily inflammatory markers Physician PPE:  Surgical mask with face shield, N-95, nonsterile gloves, disposable gown, head and shoe cover s Patient PPE:  Face mask   Leukopenia WBC 3.19, this is possibly due to above Continue management as per above  Lactic acidosis Lactic acid 2.3, continue to trend lactic acid  Thrombocytopenia possibly reactive Platelets 141, continue to monitor platelets with morning labs  Hyperglycemia secondary to type 2 DM  Continue insulin sliding scale and hypoglycemia protocol  Hyperlipidemia Continue Lipitor per home regimen  Essential hypertension Continue losartan 100 mg p.o. daily  Tobacco  abuse Patient counseled on tobacco abuse cessation Continue nicotine patch  Obesity (BMI 35.48) Patient counseled on diet and exercise  DVT prophylaxis: Lovenox  Code Status: Full code  Family Communication: None at bedside  Disposition Plan:  Patient is from:                        home Anticipated DC to:                   SNF or family members home Anticipated DC date:               2-3 days Anticipated DC barriers:           Patient unstable to be discharged at this time due to acute respiratory failure requiring oxygen supplementation in the setting of COVID-19 virus pneumonia.   Consults called: None  Admission status: Inpatient  Frankey Shown MD [facility]  If 7PM-7AM, please contact night-coverage [email or web address] Password [facility]  03/08/2020, 8:46 PM

## 2020-03-20 NOTE — ED Triage Notes (Addendum)
Pt brought in by [facility] with c/o weakness, decreased appetite x 2 weeks. CBG 305, temp 98.5 for EMS. EMS reports O2 sat 75% on RA but they felt pt's hands were very cold and believed the reading to be abnormal b/c pt denied SOB. Pt does report cough, but reports it's her usual cough and nothing new. Pt reports she didn't take her insulin yesterday or today due to "feeling so bad".

## 2020-03-20 NOTE — ED Notes (Signed)
RT at bedside.

## 2020-03-21 DIAGNOSIS — J1282 Pneumonia due to Coronavirus disease 2019: Secondary | ICD-10-CM | POA: Diagnosis not present

## 2020-03-21 DIAGNOSIS — U071 COVID-19: Secondary | ICD-10-CM | POA: Diagnosis not present

## 2020-03-21 LAB — COMPREHENSIVE METABOLIC PANEL
ALT: 27 U/L (ref 0–44)
AST: 52 U/L — ABNORMAL HIGH (ref 15–41)
Albumin: 3 g/dL — ABNORMAL LOW (ref 3.5–5.0)
Alkaline Phosphatase: 67 U/L (ref 38–126)
Anion gap: 15 (ref 5–15)
BUN: 24 mg/dL — ABNORMAL HIGH (ref 8–23)
CO2: 17 mmol/L — ABNORMAL LOW (ref 22–32)
Calcium: 8.5 mg/dL — ABNORMAL LOW (ref 8.9–10.3)
Chloride: 102 mmol/L (ref 98–111)
Creatinine, Ser: 0.95 mg/dL (ref 0.44–1.00)
GFR calc Af Amer: >60 mL/min (ref >60)
GFR calc non Af Amer: >60 mL/min (ref >60)
Glucose, Bld: 346 mg/dL — ABNORMAL HIGH (ref 70–99)
Potassium: 4.3 mmol/L (ref 3.5–5.1)
Sodium: 134 mmol/L — ABNORMAL LOW (ref 135–145)
Total Bilirubin: 0.7 mg/dL (ref 0.3–1.2)
Total Protein: 7.3 g/dL (ref 6.5–8.1)

## 2020-03-21 LAB — CBG MONITORING, ED
Glucose-Capillary: 331 mg/dL — ABNORMAL HIGH (ref 70–99)
Glucose-Capillary: 374 mg/dL — ABNORMAL HIGH (ref 70–99)
Glucose-Capillary: 332 mg/dL — ABNORMAL HIGH (ref 70–99)
Glucose-Capillary: 350 mg/dL — ABNORMAL HIGH (ref 70–99)

## 2020-03-21 LAB — CBC WITH DIFFERENTIAL/PLATELET
Abs Immature Granulocytes: 0.03 10*3/uL (ref 0.00–0.07)
Basophils Absolute: 0 10*3/uL (ref 0.0–0.1)
Basophils Relative: 1 %
Eosinophils Absolute: 0 10*3/uL (ref 0.0–0.5)
Eosinophils Relative: 0 %
HCT: 45 % (ref 36.0–46.0)
Hemoglobin: 14.2 g/dL (ref 12.0–15.0)
Immature Granulocytes: 1 %
Lymphocytes Relative: 23 %
Lymphs Abs: 0.9 10*3/uL (ref 0.7–4.0)
MCH: 28.5 pg (ref 26.0–34.0)
MCHC: 31.6 g/dL (ref 30.0–36.0)
MCV: 90.4 fL (ref 80.0–100.0)
Monocytes Absolute: 0.3 10*3/uL (ref 0.1–1.0)
Monocytes Relative: 8 %
Neutro Abs: 2.5 10*3/uL (ref 1.7–7.7)
Neutrophils Relative %: 67 %
Platelets: 128 10*3/uL — ABNORMAL LOW (ref 150–400)
RBC: 4.98 MIL/uL (ref 3.87–5.11)
RDW: 13.3 % (ref 11.5–15.5)
WBC: 3.7 10*3/uL — ABNORMAL LOW (ref 4.0–10.5)
nRBC: 0 % (ref 0.0–0.2)

## 2020-03-21 LAB — MAGNESIUM: Magnesium: 1.9 mg/dL (ref 1.7–2.4)

## 2020-03-21 LAB — HEMOGLOBIN A1C
Hgb A1c MFr Bld: 10.3 % — ABNORMAL HIGH (ref 4.8–5.6)
Mean Plasma Glucose: 248.91 mg/dL

## 2020-03-21 LAB — D-DIMER, QUANTITATIVE: D-Dimer, Quant: 1.64 ug/mL-FEU — ABNORMAL HIGH (ref 0.00–0.50)

## 2020-03-21 LAB — C-REACTIVE PROTEIN: CRP: 7.2 mg/dL — ABNORMAL HIGH (ref <1.0)

## 2020-03-21 LAB — PHOSPHORUS: Phosphorus: 4.1 mg/dL (ref 2.5–4.6)

## 2020-03-21 LAB — HIV ANTIBODY (ROUTINE TESTING W REFLEX): HIV Screen 4th Generation wRfx: NONREACTIVE

## 2020-03-21 LAB — FERRITIN: Ferritin: 861 ng/mL — ABNORMAL HIGH (ref 11–307)

## 2020-03-21 MED ORDER — INSULIN GLARGINE 100 UNIT/ML ~~LOC~~ SOLN
16.0000 [IU] | Freq: Every day | SUBCUTANEOUS | Status: DC
  Administered 2020-03-21: 16 [IU] via SUBCUTANEOUS
  Filled 2020-03-21 (×2): qty 0.16

## 2020-03-21 MED ORDER — BARICITINIB 2 MG PO TABS
4.0000 mg | ORAL_TABLET | Freq: Every day | ORAL | Status: AC
  Administered 2020-03-21 – 2020-04-01 (×12): 4 mg via ORAL
  Filled 2020-03-21 (×14): qty 2

## 2020-03-21 MED ORDER — LABETALOL HCL 5 MG/ML IV SOLN
10.0000 mg | INTRAVENOUS | Status: DC | PRN
  Filled 2020-03-21: qty 4

## 2020-03-21 MED ORDER — ENOXAPARIN SODIUM 60 MG/0.6ML ~~LOC~~ SOLN
60.0000 mg | SUBCUTANEOUS | Status: DC
  Administered 2020-03-22: 60 mg via SUBCUTANEOUS
  Filled 2020-03-21: qty 0.6

## 2020-03-21 NOTE — ED Notes (Signed)
I assumed care of this patient at this time. At the tim of assuming care, bed is locked in the lowest position, side rails x2, call bell within reach and continuous cardiac monitoring in place. Pt appears in no acute distress, respirations are even and unlabored with equal chest rise and fall. All questions and concerns voiced addressed by this RN.  Pt provided with a cold wash cloth and personal phone placed on charger at this time.  Will continue monitoring at this time.

## 2020-03-21 NOTE — ED Notes (Signed)
Pt provided with meal tray at this time. Pt placed on new oxygen tank at 15L non-re-breather at 2,000 psi. Pt assisted with setting up meal tray, non-rebreather placed on blow by while having lunch. This RN notes that patient's SpO2 in 15L non-rebreather on blow by is 79-82%. Pt appears in no distress, heart rate maintaining at 79-85 bpm. Will continue observation at this time.

## 2020-03-21 NOTE — ED Notes (Signed)
Pt placed on a new portable oxygen tank at 2,000psi at 15L non-re-breather.

## 2020-03-21 NOTE — ED Notes (Addendum)
Dr. Mariea Clonts at bedside at this time. Pt appears to be resting in bed, with no signs of distress. Bed is locked in the lowest position, side rails x2, call bell within reach. Pt provided with flowers brought by a family member at this time. All questions and concerns voiced addressed by this RN. Will continue to monitor.

## 2020-03-21 NOTE — ED Notes (Addendum)
This RN and staff to bedside for bed change. Pt rolled and changed into a clean sheet, chuck pad, drawn sheet and gown. Cardiac leads removed and repositioned at this time. Bed is locked in the lowest position, side rails x2, call bell within reach and personal belongings at bedside.

## 2020-03-21 NOTE — ED Notes (Signed)
Pt provided with dinner tray, repositioned for meal at this time. 15L non-rebreather placed on blow by at this time while eating. Pt's SpO2 declines to 79-82% with meal. Pt denies worsening shortness of breath, bed is locked in the lowest position, side rails x2, call bell within reach.

## 2020-03-21 NOTE — Progress Notes (Signed)
Patient Demographics:    [name], is a 64 y.o. female, DOB - 1956-01-18, ZOX:[id number]  Admit date - 03/29/20   Admitting Physician Frankey Shown, DO  Outpatient Primary MD for the patient is Pearson Grippe, MD  LOS - 1   No chief complaint on file.       Subjective:    [name] today has no fevers, no emesis,  No chest pain,   -Patient desaturates very easily, dyspnea with minimal exertion persist, cough persist  Assessment  & Plan :    Principal Problem:   Pneumonia due to COVID-19 virus Active Problems:   Acute respiratory failure with hypoxia (HCC)   Severe sepsis (HCC)   Leukopenia   Thrombocytopenia (HCC)   Hyperglycemia due to diabetes mellitus (HCC)   Essential hypertension   Type 2 diabetes mellitus with hyperlipidemia (HCC)   Obesity (BMI 30-39.9)   Tobacco abuse   Lactic acidosis  Brief Summary:-  64 y.o. female with medical history significant for hypertension, type 2 DM, hyperlipidemia, obesity and tobacco abuse admitted on Mar 29, 2020 with acute hypoxic respiratory failure secondary to Covid Pneumonia -Patient is Not vaccinated against COVID-19 infection  A/p  1)Acute hypoxic respiratory failure secondary to COVID-19 infection/Pneumonia--- The treatment plan and use of medications  for treatment of COVID-19 infection and possible side effects were discussed with patient/family -----Patient/Family verbalizes understanding and agrees to treatment protocols  -very dyspneic on 15 L of nonrebreather bag O2 sats with minimal activity declines to the low 80s --Patient is positive for COVID-19 infection, chest x-ray with findings of infiltrates/opacities,  patient is tachypneic/hypoxic and requiring continuous supplemental oxygen---patient meets criteria for initiation of Remdesivir AND Steroid therapy per protocol  --Check and trend inflammatory markers including D-dimer,  ferritin and  CRP---also follow CBC and CMP --Supplemental oxygen to keep O2 sats above 93% -Follow serial chest x-rays and ABGs as indicated --- Encourage prone positioning for More than 16 hours/day in increments of 2 to 3 hours at a time if able to tolerate --Attempt to maintain euvolemic state --Zinc and vitamin C as ordered -Albuterol inhaler as needed -Accu-Cheks/fingersticks while on high-dose steroids -PPI while on high-dose steroids -Enhanced dosage of anticoagulant for DVT prophylaxis given hypercoagulable state with COVID-19 infections -Given persistent severe hypoxia and inflammatory markers-baricitinib added on 03/21/2020  COVID-19 Labs  Recent Labs    03/29/20 2011 03/21/20 0719  DDIMER 1.15* 1.64*  FERRITIN 595* 861*  LDH 422*  --   CRP 5.4* 7.2*    Lab Results  Component Value Date   SARSCOV2NAA POSITIVE (A) 2020-03-29   SARSCOV2NAA NEGATIVE 02/15/2019   Actemra/Baricitinib:- The patient has hypoxia and is high-risk for intubation, but expected to survive >48 hours and has good baseline functional status.  She is not known to be on immunomodulators, anti-rejection medications, or cancer chemotherapy, has no history of TB or latent TB, and no history of diverticulitis or intestinal perforation.  Platelets are >50K, ANC is >500, and ALT/AST are below 5x ULN with no known hepatitis B infection. Baricitinib is being used under EUA by the FDA. The patient has no ESRD or AKI, known history of TB, severe neutropenia (ANC <500) or lymphopenia, or severe LFT elevations. They are not on DMARDs or probenecid, and are  not pregnant. The option to use/refuse baricitinib treatment under FDA authorization (not approval), the significant known and potential risks and benefits, the extent to which these are unknown, and information regarding all available alternatives were discussed in detail. Specifically the risk of VTE and secondary infections were discussed in detail with the patient  and/or HCPOA. They consent to proceed with treatment. - Continue Baricitinib (started 03/21/20), c/n 14 days or until hospital discharge. Monitor Cr, LFTs, differential. Keep on VTE ppx   2) COPD/tobacco abuse--- nicotine patch as ordered, steroids and bronchodilators as above #1  3)HLD-continue Lipitor watch LFTs closely with concomitant remdesivir and baricitinib use  4)HTN--currently on losartan, may use as needed IV labetalol for elevated BP  5) leukopenia and thrombocytopenia--- most likely related to underlying viral infection  6) class II obesity- -after resolution of acute hypoxic respiratory failure and Covid infection patient will be advised to adhere to low calorie diet, portion control and increase physical activity discussed with patient -Body mass index is 35.48 kg/m.  7) hyperglycemia--- anticipate worsening with steroids, Use Novolog/Humalog Sliding scale insulin with Accu-Cheks/Fingersticks as ordered    Disposition/Need for in-Hospital Stay- patient unable to be discharged at this time due to --severe hypoxic respiratory failure in the setting of COVID-19 pneumonia requiring IV remdesivir, IV steroids and supplemental oxygen  Status is: Inpatient  Remains inpatient appropriate because:severe hypoxic respiratory failure in the setting of COVID-19 pneumonia requiring IV remdesivir, IV steroids and supplemental oxygen   Disposition: The patient is from: Home              Anticipated d/c is to: Home              Anticipated d/c date is: > 3 days              Patient currently is not medically stable to d/c. Barriers: Not Clinically Stable- severe hypoxic respiratory failure in the setting of COVID-19 pneumonia requiring IV remdesivir, IV steroids and supplemental oxygen  Code Status : full  Family Communication:    (patient is alert, awake and coherent)   Consults  :  na  DVT Prophylaxis  :  Lovenox -  - SCDs    Lab Results  Component Value Date   PLT 128 (L)  03/21/2020    Inpatient Medications  Scheduled Meds: . albuterol  2 puff Inhalation Q6H  . vitamin C  500 mg Oral Daily  . atorvastatin  20 mg Oral Daily  . enoxaparin (LOVENOX) injection  40 mg Subcutaneous Q24H  . gabapentin  300 mg Oral TID  . insulin aspart  0-15 Units Subcutaneous TID WC  . insulin aspart  0-5 Units Subcutaneous QHS  . insulin glargine  16 Units Subcutaneous QHS  . losartan  100 mg Oral Daily  . methylPREDNISolone (SOLU-MEDROL) injection  0.5 mg/kg Intravenous Q12H   Followed by  . [START ON 03/23/2020] predniSONE  50 mg Oral Daily  . nicotine  21 mg Transdermal Daily  . pantoprazole (PROTONIX) IV  40 mg Intravenous QHS  . zinc sulfate  220 mg Oral Daily   Continuous Infusions: . remdesivir 100 mg in NS 100 mL Stopped (03/21/20 1151)   PRN Meds:.acetaminophen, chlorpheniramine-HYDROcodone, guaiFENesin-dextromethorphan, ondansetron **OR** ondansetron (ZOFRAN) IV    Anti-infectives (From admission, onward)   Start     Dose/Rate Route Frequency Ordered Stop   03/21/20 1000  remdesivir 100 mg in sodium chloride 0.9 % 100 mL IVPB  Status:  Discontinued       "  Followed by" Linked Group Details   100 mg 200 mL/hr over 30 Minutes Intravenous Daily 2020-03-24 2013 03-24-2020 2020   03/21/20 1000  remdesivir 100 mg in sodium chloride 0.9 % 100 mL IVPB        100 mg 200 mL/hr over 30 Minutes Intravenous Daily March 24, 2020 2025 03/25/20 0959   03/24/2020 2030  remdesivir 100 mg in sodium chloride 0.9 % 100 mL IVPB  Status:  Discontinued       "And" Linked Group Details   100 mg 200 mL/hr over 30 Minutes Intravenous Daily 03/24/2020 2016 2020/03/24 2019   03/24/20 2030  remdesivir 100 mg in sodium chloride 0.9 % 100 mL IVPB  Status:  Discontinued       "And" Linked Group Details   100 mg 200 mL/hr over 30 Minutes Intravenous  Once 2020-03-24 2016 2020-03-24 2019   Mar 24, 2020 2030  remdesivir 100 mg in sodium chloride 0.9 % 100 mL IVPB  Status:  Discontinued        100 mg 200 mL/hr  over 30 Minutes Intravenous Daily March 24, 2020 2017 March 24, 2020 2019   03-24-2020 2030  remdesivir 100 mg in sodium chloride 0.9 % 100 mL IVPB  Status:  Discontinued        100 mg 200 mL/hr over 30 Minutes Intravenous  Once 03-24-2020 2021 2020-03-24 2025   03/24/2020 2030  remdesivir 100 mg in sodium chloride 0.9 % 100 mL IVPB  Status:  Discontinued       "And" Linked Group Details   100 mg 200 mL/hr over 30 Minutes Intravenous Daily 03-24-20 2023 03/21/20 1116   2020-03-24 2030  remdesivir 100 mg in sodium chloride 0.9 % 100 mL IVPB       "And" Linked Group Details   100 mg 200 mL/hr over 30 Minutes Intravenous  Once 2020/03/24 2023 03/24/2020 2225   03-24-2020 2013  remdesivir 200 mg in sodium chloride 0.9% 250 mL IVPB  Status:  Discontinued       "Followed by" Linked Group Details   200 mg 580 mL/hr over 30 Minutes Intravenous Once March 24, 2020 2013 03/24/2020 2020        Objective:   Vitals:   03/21/20 1500 03/21/20 1530 03/21/20 1712 03/21/20 1800  BP: 131/74 108/90 (!) 127/94 139/79  Pulse: 72 72 69 73  Resp: (!) 21 15 19 19   Temp:      TempSrc:      SpO2: (!) 83% (!) 80% (!) 87% (!) 84%  Weight:      Height:        Wt Readings from Last 3 Encounters:  Mar 24, 2020 90.9 kg  09/14/19 94.1 kg  05/05/19 91.6 kg     Intake/Output Summary (Last 24 hours) at 03/21/2020 1846 Last data filed at 03/21/2020 03/23/2020 Gross per 24 hour  Intake 100 ml  Output --  Net 100 ml     Physical Exam  Gen:- Awake Alert, dyspnea on exertion HEENT:- [place].AT, No sclera icterus Nose- 15 NRB Neck-Supple Neck,No JVD,.  Lungs-diminished breath sounds, scattered rhonchi CV- S1, S2 normal, regular  Abd-  +ve B.Sounds, Abd Soft, No tenderness,    Extremity/Skin:- No  edema, pedal pulses present  Psych-affect is appropriate, oriented x3 Neuro-no new focal deficits, no tremors   Data Review:   Micro Results Recent Results (from the past 240 hour(s))  Respiratory Panel by RT PCR (Flu A&B, Covid) - Nasopharyngeal Swab      Status: Abnormal   Collection Time: 03-24-20  5:35 PM  Specimen: Nasopharyngeal Swab  Result Value Ref Range Status   SARS Coronavirus 2 by RT PCR POSITIVE (A) NEGATIVE Final    Comment: RESULT CALLED TO, READ BACK BY AND VERIFIED WITH: BETHEL,SANDRA @1844  ON 03/16/2020 BY JONES,T (NOTE) SARS-CoV-2 target nucleic acids are DETECTED.  SARS-CoV-2 RNA is generally detectable in upper respiratory specimens  during the acute phase of infection. Positive results are indicative of the presence of the identified virus, but do not rule out bacterial infection or co-infection with other pathogens not detected by the test. Clinical correlation with patient history and other diagnostic information is necessary to determine patient infection status. The expected result is Negative.  Fact Sheet for Patients:  03/22/20  Fact Sheet for Healthcare Providers: https://www.moore.com/  This test is not yet approved or cleared by the https://www.young.biz/ FDA and  has been authorized for detection and/or diagnosis of SARS-CoV-2 by FDA under an Emergency Use Authorization (EUA).  This EUA will remain in effect (meaning this test  can be used) for the duration of  the COVID-19 declaration under Section 564(b)(1) of the Act, 21 U.S.C. section 360bbb-3(b)(1), unless the authorization is terminated or revoked sooner.      Influenza A by PCR NEGATIVE NEGATIVE Final   Influenza B by PCR NEGATIVE NEGATIVE Final    Comment: (NOTE) The Xpert Xpress SARS-CoV-2/FLU/RSV assay is intended as an aid in  the diagnosis of influenza from Nasopharyngeal swab specimens and  should not be used as a sole basis for treatment. Nasal washings and  aspirates are unacceptable for Xpert Xpress SARS-CoV-2/FLU/RSV  testing.  Fact Sheet for Patients: [place]  Fact Sheet for Healthcare Providers: https://www.moore.com/  This  test is not yet approved or cleared by the https://www.young.biz/ FDA and  has been authorized for detection and/or diagnosis of SARS-CoV-2 by  FDA under an Emergency Use Authorization (EUA). This EUA will remain  in effect (meaning this test can be used) for the duration of the  Covid-19 declaration under Section 564(b)(1) of the Act, 21  U.S.C. section 360bbb-3(b)(1), unless the authorization is  terminated or revoked. Performed at [facility], [address], [place], [place]   Blood Culture (routine x 2)     Status: None (Preliminary result)   Collection Time: 03/01/2020  8:00 PM   Specimen: BLOOD LEFT ARM  Result Value Ref Range Status   Specimen Description BLOOD LEFT ARM  Final   Special Requests   Final    BOTTLES DRAWN AEROBIC AND ANAEROBIC Blood Culture adequate volume   Culture   Final    NO GROWTH < 12 HOURS Performed at [facility], [address], [place], [place]    Report Status PENDING  Incomplete  Blood Culture (routine x 2)     Status: None (Preliminary result)   Collection Time: 03/05/2020  8:11 PM   Specimen: BLOOD LEFT HAND  Result Value Ref Range Status   Specimen Description BLOOD LEFT HAND  Final   Special Requests   Final    BOTTLES DRAWN AEROBIC AND ANAEROBIC Blood Culture adequate volume   Culture   Final    NO GROWTH < 12 HOURS Performed at [facility], [address], [place], [place]    Report Status PENDING  Incomplete    Radiology Reports DG Chest Portable 1 View  Result Date: 03/19/2020 CLINICAL DATA:  Weakness, shortness of breath and cough. EXAM: PORTABLE CHEST 1 VIEW COMPARISON:  March 09, 2018 FINDINGS: Moderate severity bilateral multifocal infiltrates  are seen. There is no evidence of a pleural effusion or pneumothorax. The heart size and mediastinal contours are within normal limits. The visualized skeletal structures are unremarkable. IMPRESSION: Moderate severity bilateral multifocal infiltrates. Electronically  Signed   By: Aram Candelahaddeus  Houston M.D.   On: 22-Jul-2019 18:33     CBC Recent Labs  Lab Oct 21, 2019 1711 03/21/20 0719  WBC 3.9* 3.7*  HGB 14.6 14.2  HCT 44.1 45.0  PLT 141* 128*  MCV 85.8 90.4  MCH 28.4 28.5  MCHC 33.1 31.6  RDW 13.2 13.3  LYMPHSABS 0.8 0.9  MONOABS 0.4 0.3  EOSABS 0.0 0.0  BASOSABS 0.0 0.0    Chemistries  Recent Labs  Lab Oct 21, 2019 1711 03/21/20 0719  NA 132* 134*  K 4.0 4.3  CL 97* 102  CO2 20* 17*  GLUCOSE 271* 346*  BUN 20 24*  CREATININE 0.99 0.95  CALCIUM 8.4* 8.5*  MG  --  1.9  AST 50* 52*  ALT 27 27  ALKPHOS 70 67  BILITOT 0.7 0.7   ------------------------------------------------------------------------------------------------------------------ Recent Labs    Oct 21, 2019 2011  TRIG 123    Lab Results  Component Value Date   HGBA1C 10.3 (H) 22-Jul-2019   ------------------------------------------------------------------------------------------------------------------ No results for input(s): TSH, T4TOTAL, T3FREE, THYROIDAB in the last 72 hours.  Invalid input(s): FREET3 ------------------------------------------------------------------------------------------------------------------ Recent Labs    Oct 21, 2019 2011 03/21/20 0719  FERRITIN 595* 861*    Coagulation profile No results for input(s): INR, PROTIME in the last 168 hours.  Recent Labs    Oct 21, 2019 2011 03/21/20 0719  DDIMER 1.15* 1.64*    Cardiac Enzymes No results for input(s): CKMB, TROPONINI, MYOGLOBIN in the last 168 hours.  Invalid input(s): CK ------------------------------------------------------------------------------------------------------------------ No results found for: BNP   Shon Haleourage Mija Effertz M.D on 03/21/2020 at 6:46 PM  Go to [email or web address] - for contact info  [facility] - Office  [phone]

## 2020-03-21 NOTE — Progress Notes (Signed)
Had to  Place patient on NRB along with her [place] HFNC due to sats dropping.  Patient stated that she is not able to prone but has rolled over on her side as far as she can go.  Will notify morning RT of patient's status.

## 2020-03-21 NOTE — ED Notes (Signed)
This RN rounded on patient, found patient to be resting asleep in bed with no signs of acute distress noted. SpO2 sustaining at 78-80%. Patient is alert and oriented at this time.

## 2020-03-21 NOTE — Progress Notes (Signed)
Inpatient Diabetes Program Recommendations  AACE/ADA: New Consensus Statement on Inpatient Glycemic Control   Target Ranges:  Prepandial:   less than 140 mg/dL      Peak postprandial:   less than 180 mg/dL (1-2 hours)      Critically ill patients:  140 - 180 mg/dL   Results for [name], [name] (MRN [id number]) as of 03/21/2020 09:19  Ref. Range 02/24/2020 16:47 03/14/2020 21:35 03/21/2020 08:17  Glucose-Capillary Latest Ref Range: 70 - 99 mg/dL 093 (H) 818 (H) 299 (H)   Review of Glycemic Control  Diabetes history: DM2 Outpatient Diabetes medications: Lantus 40 units QHS, Novolog 8 units QID, Metformin 500 mg BID Current orders for Inpatient glycemic control: Novolog 0-15 units TID with meals, Novolog 0-5 units QHS; Solumedrol 45.625 mg Q12H  Inpatient Diabetes Program Recommendations:    Insulin: Please consider ordering Lantus 14 units Q24H (based on 90.9 kg x 0.15 units).  Thanks, Orlando Penner, RN, MSN, CDE Diabetes Coordinator Inpatient Diabetes Program [phone] (Team Pager from 8am to 5pm)

## 2020-03-22 DIAGNOSIS — U071 COVID-19: Secondary | ICD-10-CM | POA: Diagnosis not present

## 2020-03-22 DIAGNOSIS — J1282 Pneumonia due to Coronavirus disease 2019: Secondary | ICD-10-CM | POA: Diagnosis not present

## 2020-03-22 LAB — CBC WITH DIFFERENTIAL/PLATELET
Abs Immature Granulocytes: 0.05 10*3/uL (ref 0.00–0.07)
Basophils Absolute: 0 10*3/uL (ref 0.0–0.1)
Basophils Relative: 0 %
Eosinophils Absolute: 0 10*3/uL (ref 0.0–0.5)
Eosinophils Relative: 0 %
HCT: 42 % (ref 36.0–46.0)
Hemoglobin: 14 g/dL (ref 12.0–15.0)
Immature Granulocytes: 1 %
Lymphocytes Relative: 17 %
Lymphs Abs: 1 10*3/uL (ref 0.7–4.0)
MCH: 28.4 pg (ref 26.0–34.0)
MCHC: 33.3 g/dL (ref 30.0–36.0)
MCV: 85.2 fL (ref 80.0–100.0)
Monocytes Absolute: 0.3 10*3/uL (ref 0.1–1.0)
Monocytes Relative: 6 %
Neutro Abs: 4.7 10*3/uL (ref 1.7–7.7)
Neutrophils Relative %: 76 %
Platelets: 186 10*3/uL (ref 150–400)
RBC: 4.93 MIL/uL (ref 3.87–5.11)
RDW: 13.3 % (ref 11.5–15.5)
WBC: 6.1 10*3/uL (ref 4.0–10.5)
nRBC: 0 % (ref 0.0–0.2)

## 2020-03-22 LAB — COMPREHENSIVE METABOLIC PANEL
ALT: 30 U/L (ref 0–44)
AST: 64 U/L — ABNORMAL HIGH (ref 15–41)
Albumin: 3.2 g/dL — ABNORMAL LOW (ref 3.5–5.0)
Alkaline Phosphatase: 106 U/L (ref 38–126)
Anion gap: 11 (ref 5–15)
BUN: 33 mg/dL — ABNORMAL HIGH (ref 8–23)
CO2: 23 mmol/L (ref 22–32)
Calcium: 8.9 mg/dL (ref 8.9–10.3)
Chloride: 103 mmol/L (ref 98–111)
Creatinine, Ser: 0.94 mg/dL (ref 0.44–1.00)
GFR calc Af Amer: >60 mL/min (ref >60)
GFR calc non Af Amer: >60 mL/min (ref >60)
Glucose, Bld: 382 mg/dL — ABNORMAL HIGH (ref 70–99)
Potassium: 3.9 mmol/L (ref 3.5–5.1)
Sodium: 137 mmol/L (ref 135–145)
Total Bilirubin: 0.3 mg/dL (ref 0.3–1.2)
Total Protein: 7.3 g/dL (ref 6.5–8.1)

## 2020-03-22 LAB — PHOSPHORUS: Phosphorus: 3.6 mg/dL (ref 2.5–4.6)

## 2020-03-22 LAB — GLUCOSE, CAPILLARY: Glucose-Capillary: 394 mg/dL — ABNORMAL HIGH (ref 70–99)

## 2020-03-22 LAB — MAGNESIUM: Magnesium: 2 mg/dL (ref 1.7–2.4)

## 2020-03-22 LAB — CBG MONITORING, ED
Glucose-Capillary: 338 mg/dL — ABNORMAL HIGH (ref 70–99)
Glucose-Capillary: 385 mg/dL — ABNORMAL HIGH (ref 70–99)

## 2020-03-22 LAB — D-DIMER, QUANTITATIVE: D-Dimer, Quant: 1.21 ug/mL-FEU — ABNORMAL HIGH (ref 0.00–0.50)

## 2020-03-22 LAB — FERRITIN: Ferritin: 1618 ng/mL — ABNORMAL HIGH (ref 11–307)

## 2020-03-22 LAB — C-REACTIVE PROTEIN: CRP: 7.3 mg/dL — ABNORMAL HIGH (ref <1.0)

## 2020-03-22 MED ORDER — AMLODIPINE BESYLATE 5 MG PO TABS
10.0000 mg | ORAL_TABLET | Freq: Every day | ORAL | Status: DC
  Administered 2020-03-22 – 2020-04-01 (×11): 10 mg via ORAL
  Filled 2020-03-22 (×12): qty 2

## 2020-03-22 MED ORDER — METHYLPREDNISOLONE SODIUM SUCC 125 MG IJ SOLR
60.0000 mg | Freq: Two times a day (BID) | INTRAMUSCULAR | Status: DC
  Administered 2020-03-22 – 2020-04-03 (×25): 60 mg via INTRAVENOUS
  Filled 2020-03-22 (×26): qty 2

## 2020-03-22 MED ORDER — ENOXAPARIN SODIUM 60 MG/0.6ML ~~LOC~~ SOLN
50.0000 mg | SUBCUTANEOUS | Status: DC
  Administered 2020-03-23 – 2020-04-04 (×13): 50 mg via SUBCUTANEOUS
  Filled 2020-03-22 (×13): qty 0.6

## 2020-03-22 MED ORDER — INSULIN ASPART 100 UNIT/ML ~~LOC~~ SOLN
0.0000 [IU] | Freq: Every day | SUBCUTANEOUS | Status: DC
  Administered 2020-03-22: 5 [IU] via SUBCUTANEOUS
  Administered 2020-03-23: 3 [IU] via SUBCUTANEOUS
  Administered 2020-03-24 – 2020-03-25 (×2): 2 [IU] via SUBCUTANEOUS
  Administered 2020-03-27: 5 [IU] via SUBCUTANEOUS
  Administered 2020-03-28: 3 [IU] via SUBCUTANEOUS
  Administered 2020-04-01: 4 [IU] via SUBCUTANEOUS

## 2020-03-22 MED ORDER — PANTOPRAZOLE SODIUM 40 MG PO TBEC
40.0000 mg | DELAYED_RELEASE_TABLET | Freq: Every day | ORAL | Status: DC
  Administered 2020-03-22 – 2020-04-01 (×11): 40 mg via ORAL
  Filled 2020-03-22 (×12): qty 1

## 2020-03-22 MED ORDER — INSULIN ASPART 100 UNIT/ML ~~LOC~~ SOLN
4.0000 [IU] | Freq: Three times a day (TID) | SUBCUTANEOUS | Status: DC
  Administered 2020-03-22 – 2020-03-26 (×13): 4 [IU] via SUBCUTANEOUS
  Filled 2020-03-22: qty 1

## 2020-03-22 MED ORDER — INSULIN ASPART 100 UNIT/ML ~~LOC~~ SOLN
0.0000 [IU] | Freq: Three times a day (TID) | SUBCUTANEOUS | Status: DC
  Administered 2020-03-22 – 2020-03-23 (×2): 15 [IU] via SUBCUTANEOUS
  Administered 2020-03-23: 20 [IU] via SUBCUTANEOUS
  Administered 2020-03-23 – 2020-03-24 (×2): 15 [IU] via SUBCUTANEOUS
  Administered 2020-03-24 (×2): 11 [IU] via SUBCUTANEOUS
  Administered 2020-03-25: 15 [IU] via SUBCUTANEOUS
  Administered 2020-03-25: 3 [IU] via SUBCUTANEOUS
  Administered 2020-03-25: 11 [IU] via SUBCUTANEOUS
  Administered 2020-03-26: 7 [IU] via SUBCUTANEOUS
  Administered 2020-03-26: 11 [IU] via SUBCUTANEOUS
  Administered 2020-03-26: 4 [IU] via SUBCUTANEOUS
  Administered 2020-03-27: 20 [IU] via SUBCUTANEOUS
  Administered 2020-03-27: 7 [IU] via SUBCUTANEOUS
  Administered 2020-03-28: 4 [IU] via SUBCUTANEOUS
  Administered 2020-03-28: 15 [IU] via SUBCUTANEOUS
  Administered 2020-03-28: 11 [IU] via SUBCUTANEOUS
  Administered 2020-03-29: 20 [IU] via SUBCUTANEOUS
  Administered 2020-03-29: 7 [IU] via SUBCUTANEOUS
  Administered 2020-03-30: 15 [IU] via SUBCUTANEOUS
  Administered 2020-03-30: 3 [IU] via SUBCUTANEOUS
  Administered 2020-03-30: 7 [IU] via SUBCUTANEOUS
  Administered 2020-03-31: 4 [IU] via SUBCUTANEOUS
  Administered 2020-03-31: 15 [IU] via SUBCUTANEOUS
  Administered 2020-04-01: 4 [IU] via SUBCUTANEOUS
  Administered 2020-04-01: 3 [IU] via SUBCUTANEOUS
  Administered 2020-04-01 – 2020-04-02 (×2): 15 [IU] via SUBCUTANEOUS

## 2020-03-22 MED ORDER — GUAIFENESIN ER 600 MG PO TB12
600.0000 mg | ORAL_TABLET | Freq: Two times a day (BID) | ORAL | Status: DC
  Administered 2020-03-22 – 2020-04-01 (×21): 600 mg via ORAL
  Filled 2020-03-22 (×23): qty 1

## 2020-03-22 MED ORDER — INSULIN GLARGINE 100 UNIT/ML ~~LOC~~ SOLN
28.0000 [IU] | Freq: Every day | SUBCUTANEOUS | Status: DC
  Administered 2020-03-22: 28 [IU] via SUBCUTANEOUS
  Filled 2020-03-22 (×4): qty 0.28

## 2020-03-22 MED ORDER — PHENOL 1.4 % MT LIQD
1.0000 | OROMUCOSAL | Status: DC | PRN
  Administered 2020-03-22 – 2020-03-31 (×2): 1 via OROMUCOSAL
  Filled 2020-03-22 (×2): qty 177

## 2020-03-22 MED ORDER — ORAL CARE MOUTH RINSE
15.0000 mL | Freq: Two times a day (BID) | OROMUCOSAL | Status: DC
  Administered 2020-03-23 – 2020-04-04 (×25): 15 mL via OROMUCOSAL

## 2020-03-22 MED ORDER — CHLORHEXIDINE GLUCONATE CLOTH 2 % EX PADS
6.0000 | MEDICATED_PAD | Freq: Every day | CUTANEOUS | Status: DC
  Administered 2020-03-22 – 2020-04-04 (×14): 6 via TOPICAL

## 2020-03-22 MED ORDER — INSULIN GLARGINE 100 UNIT/ML ~~LOC~~ SOLN
10.0000 [IU] | Freq: Once | SUBCUTANEOUS | Status: DC
  Filled 2020-03-22: qty 0.1

## 2020-03-22 NOTE — ED Notes (Signed)
Pt given meal tray and beverage.

## 2020-03-22 NOTE — Progress Notes (Signed)
Inpatient Diabetes Program Recommendations  AACE/ADA: New Consensus Statement on Inpatient Glycemic Control   Target Ranges:  Prepandial:   less than 140 mg/dL      Peak postprandial:   less than 180 mg/dL (1-2 hours)      Critically ill patients:  140 - 180 mg/dL  Results for SAHIAN, KERNEY (MRN [id number]) as of 03/22/2020 10:50  Ref. Range 03/21/2020 08:17 03/21/2020 11:34 03/21/2020 16:14 03/21/2020 21:08  Glucose-Capillary Latest Ref Range: 70 - 99 mg/dL 443 (H) 154 (H) 008 (H) 350 (H)   Review of Glycemic Control  Diabetes history: DM2 Outpatient Diabetes medications: Lantus 40 units QHS, Novolog 8 units TID with meals, Metformin 500 mg BID Current orders for Inpatient glycemic control: Lantus 16 units QHS, Novolog 0-15 units TID with meals, Novolog 0-5 units QHS; Solumedrol 46.625 mg Q12H  Inpatient Diabetes Program Recommendations:    Insulin: Please consider increasing Lantus to 35 units QHS and Novolog 6 units TID with meals for meal coverage if patient eats at least 50% of meals.  Thanks, Orlando Penner, RN, MSN, CDE Diabetes Coordinator Inpatient Diabetes Program [phone] (Team Pager from 8am to 5pm)

## 2020-03-22 NOTE — Progress Notes (Signed)
Patient Demographics:    [name], is a 64 y.o. female, DOB - May 18, 1956, AJO:[id number]  Admit date - 03/14/2020   Admitting Physician Frankey Shown, DO  Outpatient Primary MD for the patient is Pearson Grippe, MD  LOS - 2  No chief complaint on file.       Subjective:    [name] today has no fevers, no emesis,  No chest pain,    RN at bedside--- we had extensive conversation with patient regarding goals of care and advanced directives -Patient repeatedly states myself and the RN at bedside at she wants to be a DNR/DNI----she understands that given her worsening respiratory status she may die- --I called and updated patient's son ([name] at [phone]) on patient's condition and patient's decision for DNR/DNI status  -Dyspnea and hypoxia has worsened --Overnight patient continued to decompensate from a respiratory standpoint -Patient currently on 60 L of heated high flow 100% oxygen along with nonrebreather bag at 15 L -Patient with conversational dyspnea  Assessment  & Plan :    Principal Problem:   Pneumonia due to COVID-19 virus Active Problems:   Acute respiratory failure with hypoxia (HCC)   Severe sepsis (HCC)   Leukopenia   Thrombocytopenia (HCC)   Hyperglycemia due to diabetes mellitus (HCC)   Essential hypertension   Type 2 diabetes mellitus with hyperlipidemia (HCC)   Obesity (BMI 30-39.9)   Tobacco abuse   Lactic acidosis  Brief Summary:-  64 y.o. female with medical history significant for hypertension, type 2 DM, hyperlipidemia, obesity and tobacco abuse admitted on 03/11/2020 with acute hypoxic respiratory failure secondary to Covid Pneumonia -Patient is Not vaccinated against COVID-19 infection  A/p  1)Acute hypoxic respiratory failure secondary to COVID-19 infection/Pneumonia--- The treatment plan and use of medications  for treatment of COVID-19  infection and possible side effects were discussed with patient/family -----Patient/Family verbalizes understanding and agrees to treatment protocols --Patient currently on 60 L of heated high flow 100% oxygen along with nonrebreather bag at 15 L --Patient is positive for COVID-19 infection, chest x-ray with findings of infiltrates/opacities,  patient is tachypneic/hypoxic and requiring continuous supplemental oxygen---patient meets criteria for initiation of Remdesivir AND Steroid therapy per protocol  --Check and trend inflammatory markers including D-dimer, ferritin and  CRP---also follow CBC and CMP --Supplemental oxygen to keep O2 sats above 93% -Follow serial chest x-rays and ABGs as indicated --- Encourage prone positioning for More than 16 hours/day in increments of 2 to 3 hours at a time if able to tolerate --Attempt to maintain euvolemic state --Zinc and vitamin C as ordered -Albuterol inhaler as needed -Accu-Cheks/fingersticks while on high-dose steroids -PPI while on high-dose steroids -Enhanced dosage of anticoagulant for DVT prophylaxis given hypercoagulable state with COVID-19 infections -Continue IV steroids and remdesivir started on 02/28/2020 -Given persistent severe hypoxia and inflammatory markers-baricitinib added on 03/21/2020  COVID-19 Labs  Recent Labs    03/14/2020 2011 03/21/20 0719 03/22/20 0533 03/22/20 0732  DDIMER 1.15* 1.64*  --  1.21*  FERRITIN 595* 861* 1,618*  --   LDH 422*  --   --   --   CRP 5.4* 7.2* 7.3*  --     Lab Results  Component Value Date   SARSCOV2NAA POSITIVE (A) 03/10/2020  SARSCOV2NAA NEGATIVE 02/15/2019   Actemra/Baricitinib:- The patient has hypoxia and is high-risk for intubation, but expected to survive >48 hours and has good baseline functional status.  She is not known to be on immunomodulators, anti-rejection medications, or cancer chemotherapy, has no history of TB or latent TB, and no history of diverticulitis or intestinal  perforation.  Platelets are >50K, ANC is >500, and ALT/AST are below 5x ULN with no known hepatitis B infection. Baricitinib is being used under EUA by the FDA. The patient has no ESRD or AKI, known history of TB, severe neutropenia (ANC <500) or lymphopenia, or severe LFT elevations. They are not on DMARDs or probenecid, and are not pregnant. The option to use/refuse baricitinib treatment under FDA authorization (not approval), the significant known and potential risks and benefits, the extent to which these are unknown, and information regarding all available alternatives were discussed in detail. Specifically the risk of VTE and secondary infections were discussed in detail with the patient and/or HCPOA. They consent to proceed with treatment. - Continue Baricitinib (started 03/21/20), c/n 14 days or until hospital discharge. Monitor Cr, LFTs, differential. Keep on VTE ppx   2) COPD/tobacco abuse--- nicotine patch as ordered, steroids and bronchodilators as above #1  3)HLD-stop Lipitor for now, watch LFTs closely with concomitant remdesivir and baricitinib use  4)HTN--currently on losartan, may use as needed IV labetalol for elevated BP  5) leukopenia and thrombocytopenia--- most likely related to underlying viral infection  6) class II obesity- -after resolution of acute hypoxic respiratory failure and Covid infection patient will be advised to adhere to low calorie diet, portion control and increase physical activity discussed with patient -Body mass index is 35.48 kg/m.  7) hyperglycemia--- anticipate worsening with steroids, Use Novolog/Humalog Sliding scale insulin with Accu-Cheks/Fingersticks as ordered -Insulin regimen adjusted due to persistent hyperglycemia  8)Social/Ethics----- RN [name] at bedside--- we had extensive conversation with patient regarding goals of care and advanced directives -Patient repeatedly states myself and the RN at bedside that she wants to be a  DNR/DNI----she understands that given her worsening respiratory status she may die- --I called and updated patient's son ([name] at [phone] on patient's condition and patient's decision for DNR/DNI status  -Dyspnea and hypoxia has worsened --Overnight patient continued to decompensate from a respiratory standpoint  -Patient with conversational dyspnea    Disposition/Need for in-Hospital Stay- patient unable to be discharged at this time due to --severe hypoxic respiratory failure in the setting of COVID-19 pneumonia requiring IV remdesivir, IV steroids and supplemental oxygen  Status is: Inpatient  Remains inpatient appropriate because:severe hypoxic respiratory failure in the setting of COVID-19 pneumonia requiring IV remdesivir, IV steroids and supplemental oxygen   Disposition: The patient is from: Home              Anticipated d/c is to: Home              Anticipated d/c date is: > 3 days              Patient currently is not medically stable to d/c. Barriers: Not Clinically Stable- severe hypoxic respiratory failure in the setting of COVID-19 pneumonia requiring IV remdesivir, IV steroids and supplemental oxygen  Code Status : full  Family Communication:    (patient is alert, awake and coherent)   Consults  :  na  DVT Prophylaxis  :  Lovenox -  - SCDs    Lab Results  Component Value Date   PLT 186  03/22/2020    Inpatient Medications  Scheduled Meds: . albuterol  2 puff Inhalation Q6H  . amLODipine  10 mg Oral Daily  . vitamin C  500 mg Oral Daily  . baricitinib  4 mg Oral Daily  . [START ON 03/23/2020] enoxaparin (LOVENOX) injection  50 mg Subcutaneous Q24H  . gabapentin  300 mg Oral TID  . guaiFENesin  600 mg Oral BID  . insulin aspart  0-20 Units Subcutaneous TID WC  . insulin aspart  0-5 Units Subcutaneous QHS  . insulin aspart  4 Units Subcutaneous TID WC  . insulin glargine  10 Units Subcutaneous Once  . insulin glargine  28 Units  Subcutaneous QHS  . nicotine  21 mg Transdermal Daily  . pantoprazole  40 mg Oral Daily  . zinc sulfate  220 mg Oral Daily   Continuous Infusions: . remdesivir 100 mg in NS 100 mL 100 mg (03/22/20 1118)   PRN Meds:.acetaminophen, chlorpheniramine-HYDROcodone, guaiFENesin-dextromethorphan, labetalol, ondansetron **OR** ondansetron (ZOFRAN) IV    Anti-infectives (From admission, onward)   Start     Dose/Rate Route Frequency Ordered Stop   03/21/20 1000  remdesivir 100 mg in sodium chloride 0.9 % 100 mL IVPB  Status:  Discontinued       "Followed by" Linked Group Details   100 mg 200 mL/hr over 30 Minutes Intravenous Daily 03-30-20 2013 30-Mar-2020 2020   03/21/20 1000  remdesivir 100 mg in sodium chloride 0.9 % 100 mL IVPB        100 mg 200 mL/hr over 30 Minutes Intravenous Daily 03/30/2020 2025 03/25/20 0959   Mar 30, 2020 2030  remdesivir 100 mg in sodium chloride 0.9 % 100 mL IVPB  Status:  Discontinued       "And" Linked Group Details   100 mg 200 mL/hr over 30 Minutes Intravenous Daily Mar 30, 2020 2016 03-30-20 2019   03-30-20 2030  remdesivir 100 mg in sodium chloride 0.9 % 100 mL IVPB  Status:  Discontinued       "And" Linked Group Details   100 mg 200 mL/hr over 30 Minutes Intravenous  Once March 30, 2020 2016 03/30/2020 2019   03-30-2020 2030  remdesivir 100 mg in sodium chloride 0.9 % 100 mL IVPB  Status:  Discontinued        100 mg 200 mL/hr over 30 Minutes Intravenous Daily 30-Mar-2020 2017 March 30, 2020 2019   March 30, 2020 2030  remdesivir 100 mg in sodium chloride 0.9 % 100 mL IVPB  Status:  Discontinued        100 mg 200 mL/hr over 30 Minutes Intravenous  Once 03-30-20 2021 03/30/20 2025   2020-03-30 2030  remdesivir 100 mg in sodium chloride 0.9 % 100 mL IVPB  Status:  Discontinued       "And" Linked Group Details   100 mg 200 mL/hr over 30 Minutes Intravenous Daily 2020-03-30 2023 03/21/20 1116   Mar 30, 2020 2030  remdesivir 100 mg in sodium chloride 0.9 % 100 mL IVPB       "And" Linked Group Details    100 mg 200 mL/hr over 30 Minutes Intravenous  Once 30-Mar-2020 2023 30-Mar-2020 2225   March 30, 2020 2013  remdesivir 200 mg in sodium chloride 0.9% 250 mL IVPB  Status:  Discontinued       "Followed by" Linked Group Details   200 mg 580 mL/hr over 30 Minutes Intravenous Once March 30, 2020 2013 2020-03-30 2020        Objective:   Vitals:   03/22/20 0900 03/22/20 0930 03/22/20 1000 03/22/20 1134  BP: Marland Kitchen)  146/113 (!) 160/93 (!) 173/90 (!) 168/84  Pulse: 65 69 64 76  Resp: (!) 26 (!) 24 19 (!) 27  Temp:    98 F (36.7 C)  TempSrc:    Oral  SpO2: (!) 88% (!) 88% 90% (!) 86%  Weight:      Height:        Wt Readings from Last 3 Encounters:  03/15/2020 90.9 kg  09/14/19 94.1 kg  05/05/19 91.6 kg    No intake or output data in the 24 hours ending 03/22/20 1211   Physical Exam  Gen:- Awake Alert, dyspnea on exertion HEENT:- [place].AT, No sclera icterus Nose- 15 NRB Neck-Supple Neck,No JVD,.  Lungs-diminished breath sounds, scattered rhonchi CV- S1, S2 normal, regular  Abd-  +ve B.Sounds, Abd Soft, No tenderness,    Extremity/Skin:- No  edema, pedal pulses present  Psych-affect is appropriate, oriented x3 Neuro-no new focal deficits, no tremors   Data Review:   Micro Results Recent Results (from the past 240 hour(s))  Respiratory Panel by RT PCR (Flu A&B, Covid) - Nasopharyngeal Swab     Status: Abnormal   Collection Time: 03/08/2020  5:35 PM   Specimen: Nasopharyngeal Swab  Result Value Ref Range Status   SARS Coronavirus 2 by RT PCR POSITIVE (A) NEGATIVE Final    Comment: RESULT CALLED TO, READ BACK BY AND VERIFIED WITH: BETHEL,SANDRA @1844  ON 02/24/2020 BY JONES,T (NOTE) SARS-CoV-2 target nucleic acids are DETECTED.  SARS-CoV-2 RNA is generally detectable in upper respiratory specimens  during the acute phase of infection. Positive results are indicative of the presence of the identified virus, but do not rule out bacterial infection or co-infection with other pathogens not detected by  the test. Clinical correlation with patient history and other diagnostic information is necessary to determine patient infection status. The expected result is Negative.  Fact Sheet for Patients:  https://www.moore.com/https://www.fda.gov/media/142436/download  Fact Sheet for Healthcare Providers: https://www.young.biz/https://www.fda.gov/media/142435/download  This test is not yet approved or cleared by the [place] FDA and  has been authorized for detection and/or diagnosis of SARS-CoV-2 by FDA under an Emergency Use Authorization (EUA).  This EUA will remain in effect (meaning this test  can be used) for the duration of  the COVID-19 declaration under Section 564(b)(1) of the Act, 21 U.S.C. section 360bbb-3(b)(1), unless the authorization is terminated or revoked sooner.      Influenza A by PCR NEGATIVE NEGATIVE Final   Influenza B by PCR NEGATIVE NEGATIVE Final    Comment: (NOTE) The Xpert Xpress SARS-CoV-2/FLU/RSV assay is intended as an aid in  the diagnosis of influenza from Nasopharyngeal swab specimens and  should not be used as a sole basis for treatment. Nasal washings and  aspirates are unacceptable for Xpert Xpress SARS-CoV-2/FLU/RSV  testing.  Fact Sheet for Patients: https://www.moore.com/https://www.fda.gov/media/142436/download  Fact Sheet for Healthcare Providers: https://www.young.biz/https://www.fda.gov/media/142435/download  This test is not yet approved or cleared by the [place] FDA and  has been authorized for detection and/or diagnosis of SARS-CoV-2 by  FDA under an Emergency Use Authorization (EUA). This EUA will remain  in effect (meaning this test can be used) for the duration of the  Covid-19 declaration under Section 564(b)(1) of the Act, 21  U.S.C. section 360bbb-3(b)(1), unless the authorization is  terminated or revoked. Performed at [facility], [address] [place], [place] [address]   Blood Culture (routine x 2)     Status: None (Preliminary result)   Collection Time: 03/21/2020  8:00 PM   Specimen: BLOOD LEFT  ARM  Result Value Ref Range Status   Specimen Description BLOOD LEFT ARM  Final   Special Requests   Final    BOTTLES DRAWN AEROBIC AND ANAEROBIC Blood Culture adequate volume   Culture   Final    NO GROWTH 2 DAYS Performed at [facility], [address], [place], [place] [address]    Report Status PENDING  Incomplete  Blood Culture (routine x 2)     Status: None (Preliminary result)   Collection Time: 03/15/2020  8:11 PM   Specimen: BLOOD LEFT HAND  Result Value Ref Range Status   Specimen Description BLOOD LEFT HAND  Final   Special Requests   Final    BOTTLES DRAWN AEROBIC AND ANAEROBIC Blood Culture adequate volume   Culture   Final    NO GROWTH 2 DAYS Performed at [facility], [address], [place], [place] [address]    Report Status PENDING  Incomplete    Radiology Reports DG Chest Portable 1 View  Result Date: 03/21/2020 CLINICAL DATA:  Weakness, shortness of breath and cough. EXAM: PORTABLE CHEST 1 VIEW COMPARISON:  March 09, 2018 FINDINGS: Moderate severity bilateral multifocal infiltrates are seen. There is no evidence of a pleural effusion or pneumothorax. The heart size and mediastinal contours are within normal limits. The visualized skeletal structures are unremarkable. IMPRESSION: Moderate severity bilateral multifocal infiltrates. Electronically Signed   By: Aram Candela M.D.   On: 02/28/2020 18:33     CBC Recent Labs  Lab 03/15/2020 1711 03/21/20 0719 03/22/20 0732  WBC 3.9* 3.7* 6.1  HGB 14.6 14.2 14.0  HCT 44.1 45.0 42.0  PLT 141* 128* 186  MCV 85.8 90.4 85.2  MCH 28.4 28.5 28.4  MCHC 33.1 31.6 33.3  RDW 13.2 13.3 13.3  LYMPHSABS 0.8 0.9 1.0  MONOABS 0.4 0.3 0.3  EOSABS 0.0 0.0 0.0  BASOSABS 0.0 0.0 0.0    Chemistries  Recent Labs  Lab 02/22/2020 1711 03/21/20 0719 03/22/20 0732  NA 132* 134* 137  K 4.0 4.3 3.9  CL 97* 102 103  CO2 20* 17* 23  GLUCOSE 271* 346* 382*  BUN 20 24* 33*  CREATININE 0.99 0.95 0.94  CALCIUM 8.4* 8.5* 8.9   MG  --  1.9 2.0  AST 50* 52* 64*  ALT 27 27 30   ALKPHOS 70 67 106  BILITOT 0.7 0.7 0.3   ------------------------------------------------------------------------------------------------------------------ Recent Labs    03/21/2020 2011  TRIG 123    Lab Results  Component Value Date   HGBA1C 10.3 (H) 03/18/2020   ------------------------------------------------------------------------------------------------------------------ No results for input(s): TSH, T4TOTAL, T3FREE, THYROIDAB in the last 72 hours.  Invalid input(s): FREET3 ------------------------------------------------------------------------------------------------------------------ Recent Labs    03/21/20 0719 03/22/20 0533  FERRITIN 861* 1,618*    Coagulation profile No results for input(s): INR, PROTIME in the last 168 hours.  Recent Labs    03/21/20 0719 03/22/20 0732  DDIMER 1.64* 1.21*    Cardiac Enzymes No results for input(s): CKMB, TROPONINI, MYOGLOBIN in the last 168 hours.  Invalid input(s): CK ------------------------------------------------------------------------------------------------------------------ No results found for: BNP   03/24/20 M.D on 03/22/2020 at 12:11 PM  Go to [email or web address] - for contact info  [facility] - Office  [phone]

## 2020-03-22 NOTE — ED Notes (Signed)
Pt given ice pack for her head

## 2020-03-22 NOTE — ED Notes (Signed)
ED TO INPATIENT HANDOFF REPORT  ED Nurse Name and Phone #:  Neldon Mc RN [phone]  S Name/Age/Gender [name] 64 y.o. female Room/Bed: APA19/APA19  Code Status   Code Status: DNR  Home/SNF/Other Home Patient oriented to: self, place, time and situation Is this baseline? Yes   Triage Complete: Triage complete  Chief Complaint Pneumonia due to COVID-19 virus [U07.1, J12.82]  Triage Note Pt brought in by [facility] with c/o weakness, decreased appetite x 2 weeks. CBG 305, temp 98.5 for EMS. EMS reports O2 sat 75% on RA but they felt pt's hands were very cold and believed the reading to be abnormal b/c pt denied SOB. Pt does report cough, but reports it's her usual cough and nothing new. Pt reports she didn't take her insulin yesterday or today due to "feeling so bad".     Allergies No Known Allergies  Level of Care/Admitting Diagnosis ED Disposition    ED Disposition Condition Comment   Admit  Hospital Area: [facility] [[id number]]  Level of Care: Stepdown [14]  Covid Evaluation: Confirmed COVID Positive  Diagnosis: Pneumonia due to COVID-19 virus [[id number]]  Admitting Physician: Frankey Shown [[id number]]  Attending Physician: Frankey Shown [[id number]]  Estimated length of stay: past midnight tomorrow  Certification:: I certify this patient will need inpatient services for at least 2 midnights       B Medical/Surgery History Past Medical History:  Diagnosis Date  . Arthritis    degenerative lumbar spine   . 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 Chronic back pain   . COPD (chronic obstructive pulmonary disease) (HCC)    smokes 1/2ppd  . Depression   . GERD (gastroesophageal reflux disease)   . High cholesterol   . History of cerebral aneurysm repair   . Hypertension    states under control with med., has been on med. x "a long time"  . Insulin dependent diabetes mellitus    Past Surgical History:  Procedure Laterality Date  .  CARPAL TUNNEL RELEASE Right 03/25/2016   Procedure: RIGHT ENDOSCOPIC CARPAL TUNNEL RELEASE;  Surgeon: Mack Hook, MD;  Location: [facility];  Service: Orthopedics;  Laterality: Right;  . CARPAL TUNNEL RELEASE Left 04/08/2016   Procedure: LEFT CARPAL TUNNEL RELEASE ENDOSCOPIC;  Surgeon: Mack Hook, MD;  Location: [facility];  Service: Orthopedics;  Laterality: Left;  . CEREBRAL ANGIOGRAM  07/18/2014  . CRANIOTOMY Right 02/15/2015   Procedure: Right Frontotemporal Craniotomy for aneurysm clipping;  Surgeon: Lisbeth Renshaw, MD;  Location: [facility];  Service: Neurosurgery;  Laterality: Right;  right  . INSERTION OF MESH N/A 02/18/2019   Procedure: INSERTION OF MESH;  Surgeon: Darnell Level, MD;  Location: [facility];  Service: General;  Laterality: N/A;  . RADIOLOGY WITH ANESTHESIA N/A 09/07/2014   Procedure: Embolization Stent Supported Coil of Aneurysm;  Surgeon: Lisbeth Renshaw, MD;  Location: [facility];  Service: Radiology;  Laterality: N/A;  . TUBAL LIGATION    . UMBILICAL HERNIA REPAIR  05/14/1007  . VENTRAL HERNIA REPAIR N/A 02/18/2019   Procedure: VENTRAL HERNIA REPAIR;  Surgeon: Darnell Level, MD;  Location: [facility];  Service: General;  Laterality: N/A;     A IV Location/Drains/Wounds Patient Lines/Drains/Airways Status    Active Line/Drains/Airways    Name Placement date Placement time Site Days   Peripheral IV 02/24/2020 Right Antecubital 03/19/2020  1810  Antecubital  2   Peripheral IV 03/18/2020 Anterior;Distal;Left;Upper Arm 02/22/2020  2056  Arm  2   External Urinary Catheter 03/13/2020  2243  --  2   Incision (Closed) 02/18/19 Abdomen Other (Comment) 02/18/19  1109   398          Intake/Output Last 24 hours  Intake/Output Summary (Last 24 hours) at 03/22/2020 2058 Last data filed at 03/22/2020 2030 Gross per 24 hour  Intake 192.28 ml  Output 800 ml  Net -607.72 ml    Labs/Imaging Results for orders placed  or performed during the hospital encounter of 03/19/2020 (from the past 48 hour(s))  CBG monitoring, ED     Status: Abnormal   Collection Time: 02/27/2020  9:35 PM  Result Value Ref Range   Glucose-Capillary 249 (H) 70 - 99 mg/dL    Comment: Glucose reference range applies only to samples taken after fasting for at least 8 hours.  CBC with Differential/Platelet     Status: Abnormal   Collection Time: 03/21/20  7:19 AM  Result Value Ref Range   WBC 3.7 (L) 4.0 - 10.5 K/uL   RBC 4.98 3.87 - 5.11 MIL/uL   Hemoglobin 14.2 12.0 - 15.0 g/dL   HCT 16.1 36 - 46 %   MCV 90.4 80.0 - 100.0 fL   MCH 28.5 26.0 - 34.0 pg   MCHC 31.6 30.0 - 36.0 g/dL   RDW 09.6 04.5 - 40.9 %   Platelets 128 (L) 150 - 400 K/uL   nRBC 0.0 0.0 - 0.2 %   Neutrophils Relative % 67 %   Neutro Abs 2.5 1.7 - 7.7 K/uL   Lymphocytes Relative 23 %   Lymphs Abs 0.9 0.7 - 4.0 K/uL   Monocytes Relative 8 %   Monocytes Absolute 0.3 0 - 1 K/uL   Eosinophils Relative 0 %   Eosinophils Absolute 0.0 0 - 0 K/uL   Basophils Relative 1 %   Basophils Absolute 0.0 0 - 0 K/uL   Immature Granulocytes 1 %   Abs Immature Granulocytes 0.03 0.00 - 0.07 K/uL   Reactive, Benign Lymphocytes PRESENT     Comment: Performed at [facility], [address], [place], [place] [address]  Comprehensive metabolic panel     Status: Abnormal   Collection Time: 03/21/20  7:19 AM  Result Value Ref Range   Sodium 134 (L) 135 - 145 mmol/L   Potassium 4.3 3.5 - 5.1 mmol/L   Chloride 102 98 - 111 mmol/L   CO2 17 (L) 22 - 32 mmol/L   Glucose, Bld 346 (H) 70 - 99 mg/dL    Comment: Glucose reference range applies only to samples taken after fasting for at least 8 hours.   BUN 24 (H) 8 - 23 mg/dL   Creatinine, Ser 4.78 0.44 - 1.00 mg/dL   Calcium 8.5 (L) 8.9 - 10.3 mg/dL   Total Protein 7.3 6.5 - 8.1 g/dL   Albumin 3.0 (L) 3.5 - 5.0 g/dL   AST 52 (H) 15 - 41 U/L   ALT 27 0 - 44 U/L   Alkaline Phosphatase 67 38 - 126 U/L   Total Bilirubin 0.7 0.3 - 1.2  mg/dL   GFR calc non Af Amer >60 >60 mL/min   GFR calc Af Amer >60 >60 mL/min   Anion gap 15 5 - 15    Comment: Performed at [facility], [address] [place], [place] [address]  C-reactive protein     Status: Abnormal   Collection Time: 03/21/20  7:19 AM  Result Value Ref Range   CRP 7.2 (H) <1.0  mg/dL    Comment: Performed at [facility], [address] [place], [place] [address]  D-dimer, quantitative (not at [facility])     Status: Abnormal   Collection Time: 03/21/20  7:19 AM  Result Value Ref Range   D-Dimer, Quant 1.64 (H) 0.00 - 0.50 ug/mL-FEU    Comment: (NOTE) At the manufacturer cut-off of 0.50 ug/mL FEU, this assay has been documented to exclude PE with a sensitivity and negative predictive value of 97 to 99%.  At this time, this assay has not been approved by the FDA to exclude DVT/VTE. Results should be correlated with clinical presentation. Performed at [facility], [address] [place], [place] [address]   Ferritin     Status: Abnormal   Collection Time: 03/21/20  7:19 AM  Result Value Ref Range   Ferritin 861 (H) 11 - 307 ng/mL    Comment: Performed at [facility], [address] [place], [place] [address]  Magnesium     Status: None   Collection Time: 03/21/20  7:19 AM  Result Value Ref Range   Magnesium 1.9 1.7 - 2.4 mg/dL    Comment: Performed at [facility], [address] [place], [place] [address]  Phosphorus     Status: None   Collection Time: 03/21/20  7:19 AM  Result Value Ref Range   Phosphorus 4.1 2.5 - 4.6 mg/dL    Comment: Performed at [facility], [address], [place], [place] [address]  CBG monitoring, ED     Status: Abnormal   Collection Time: 03/21/20  8:17 AM  Result Value Ref Range   Glucose-Capillary 331 (H) 70 - 99 mg/dL    Comment: Glucose reference range applies only to samples taken after fasting for at least 8 hours.  CBG monitoring, ED     Status: Abnormal   Collection Time: 03/21/20 11:34 AM  Result Value Ref Range    Glucose-Capillary 374 (H) 70 - 99 mg/dL    Comment: Glucose reference range applies only to samples taken after fasting for at least 8 hours.  CBG monitoring, ED     Status: Abnormal   Collection Time: 03/21/20  4:14 PM  Result Value Ref Range   Glucose-Capillary 332 (H) 70 - 99 mg/dL    Comment: Glucose reference range applies only to samples taken after fasting for at least 8 hours.  CBG monitoring, ED     Status: Abnormal   Collection Time: 03/21/20  9:08 PM  Result Value Ref Range   Glucose-Capillary 350 (H) 70 - 99 mg/dL    Comment: Glucose reference range applies only to samples taken after fasting for at least 8 hours.  C-reactive protein     Status: Abnormal   Collection Time: 03/22/20  5:33 AM  Result Value Ref Range   CRP 7.3 (H) <1.0 mg/dL    Comment: Performed at [facility], [address], [place], [place] [address]  Ferritin     Status: Abnormal   Collection Time: 03/22/20  5:33 AM  Result Value Ref Range   Ferritin 1,618 (H) 11 - 307 ng/mL    Comment: Performed at [facility], [address] [place], [place] [address]  CBC with Differential/Platelet     Status: None   Collection Time: 03/22/20  7:32 AM  Result Value Ref Range   WBC 6.1 4.0 - 10.5 K/uL   RBC 4.93 3.87 - 5.11 MIL/uL   Hemoglobin 14.0 12.0 - 15.0 g/dL   HCT 96.2 36 - 46 %  MCV 85.2 80.0 - 100.0 fL   MCH 28.4 26.0 - 34.0 pg   MCHC 33.3 30.0 - 36.0 g/dL   RDW 40.913.3 81.111.5 - 91.415.5 %   Platelets 186 150 - 400 K/uL   nRBC 0.0 0.0 - 0.2 %   Neutrophils Relative % 76 %   Neutro Abs 4.7 1.7 - 7.7 K/uL   Lymphocytes Relative 17 %   Lymphs Abs 1.0 0.7 - 4.0 K/uL   Monocytes Relative 6 %   Monocytes Absolute 0.3 0 - 1 K/uL   Eosinophils Relative 0 %   Eosinophils Absolute 0.0 0 - 0 K/uL   Basophils Relative 0 %   Basophils Absolute 0.0 0 - 0 K/uL   Immature Granulocytes 1 %   Abs Immature Granulocytes 0.05 0.00 - 0.07 K/uL   Reactive, Benign Lymphocytes PRESENT     Comment: Performed at [facility], [address] [place], [place] [address]  Comprehensive metabolic panel     Status: Abnormal   Collection Time: 03/22/20  7:32 AM  Result Value Ref Range   Sodium 137 135 - 145 mmol/L   Potassium 3.9 3.5 - 5.1 mmol/L   Chloride 103 98 - 111 mmol/L   CO2 23 22 - 32 mmol/L   Glucose, Bld 382 (H) 70 - 99 mg/dL    Comment: Glucose reference range applies only to samples taken after fasting for at least 8 hours.   BUN 33 (H) 8 - 23 mg/dL   Creatinine, Ser 6.210.94 0.44 - 1.00 mg/dL   Calcium 8.9 8.9 - 30.810.3 mg/dL   Total Protein 7.3 6.5 - 8.1 g/dL   Albumin 3.2 (L) 3.5 - 5.0 g/dL   AST 64 (H) 15 - 41 U/L   ALT 30 0 - 44 U/L   Alkaline Phosphatase 106 38 - 126 U/L   Total Bilirubin 0.3 0.3 - 1.2 mg/dL   GFR calc non Af Amer >60 >60 mL/min   GFR calc Af Amer >60 >60 mL/min   Anion gap 11 5 - 15    Comment: Performed at [facility], [address] [place], [place] [address]  D-dimer, quantitative (not at [facility])     Status: Abnormal   Collection Time: 03/22/20  7:32 AM  Result Value Ref Range   D-Dimer, Quant 1.21 (H) 0.00 - 0.50 ug/mL-FEU    Comment: (NOTE) At the manufacturer cut-off of 0.50 ug/mL FEU, this assay has been documented to exclude PE with a sensitivity and negative predictive value of 97 to 99%.  At this time, this assay has not been approved by the FDA to exclude DVT/VTE. Results should be correlated with clinical presentation. Performed at [facility], [address] [place], [place] [address]   Magnesium     Status: None   Collection Time: 03/22/20  7:32 AM  Result Value Ref Range   Magnesium 2.0 1.7 - 2.4 mg/dL    Comment: Performed at [facility], [address] [place], [place] [address]  Phosphorus     Status: None   Collection Time: 03/22/20  7:32 AM  Result Value Ref Range   Phosphorus 3.6 2.5 - 4.6 mg/dL    Comment: Performed at [facility], [address] [place], [place] [address]  CBG monitoring, ED     Status: Abnormal   Collection Time: 03/22/20  11:30 AM  Result Value Ref Range   Glucose-Capillary 338 (H) 70 - 99 mg/dL    Comment: Glucose reference range applies only to samples taken  after fasting for at least 8 hours.  CBG monitoring, ED     Status: Abnormal   Collection Time: 03/22/20  5:45 PM  Result Value Ref Range   Glucose-Capillary 385 (H) 70 - 99 mg/dL    Comment: Glucose reference range applies only to samples taken after fasting for at least 8 hours.   No results found.  Pending Labs Unresulted Labs (From admission, onward)          Start     Ordered   03/27/20 0500  Creatinine, serum  (enoxaparin (LOVENOX)    CrCl >/= 30 ml/min)  Weekly,   R     Comments: while on enoxaparin therapy    April 16, 2020 2013   03/21/20 0500  CBC with Differential/Platelet  Daily,   R      04-16-20 2013   03/21/20 0500  Comprehensive metabolic panel  Daily,   R      2020/04/16 2013   03/21/20 0500  C-reactive protein  Daily,   R      04/16/20 2013   03/21/20 0500  D-dimer, quantitative (not at [facility])  Daily,   R      April 16, 2020 2013   03/21/20 0500  Ferritin  Daily,   R      04/16/2020 2013   03/21/20 0500  Magnesium  Daily,   R      04/16/2020 2013   03/21/20 0500  Phosphorus  Daily,   R      2020/04/16 2013          Vitals/Pain Today's Vitals   03/22/20 1900 03/22/20 1930 03/22/20 2000 03/22/20 2015  BP: (!) 163/93 (!) 141/90 (!) 145/95 (!) 145/95  Pulse: 76 69 75 83  Resp: (!) 26 (!) 23 (!) 27 17  Temp:      TempSrc:      SpO2: (!) 87% (!) 85% (!) 85% 91%  Weight:      Height:      PainSc:        Isolation Precautions Airborne and Contact precautions  Medications Medications  gabapentin (NEURONTIN) capsule 300 mg (300 mg Oral Given 03/22/20 1751)  albuterol (VENTOLIN HFA) 108 (90 Base) MCG/ACT inhaler 2 puff (2 puffs Inhalation Given 03/22/20 2015)  guaiFENesin-dextromethorphan (ROBITUSSIN DM) 100-10 MG/5ML syrup 10 mL (10 mLs Oral Given 03/22/20 2029)  chlorpheniramine-HYDROcodone (TUSSIONEX) 10-8 MG/5ML suspension 5 mL  (5 mLs Oral Given 03/21/20 0914)  ascorbic acid (VITAMIN C) tablet 500 mg (500 mg Oral Given 03/22/20 1122)  zinc sulfate capsule 220 mg (220 mg Oral Given 03/22/20 1121)  acetaminophen (TYLENOL) tablet 650 mg (650 mg Oral Given 03/21/20 1610)  ondansetron (ZOFRAN) tablet 4 mg (has no administration in time range)    Or  ondansetron (ZOFRAN) injection 4 mg (has no administration in time range)  remdesivir 100 mg in sodium chloride 0.9 % 100 mL IVPB (0 mg Intravenous Stopped 03/22/20 1148)  nicotine (NICODERM CQ - dosed in mg/24 hours) patch 21 mg (21 mg Transdermal Patch Applied 03/22/20 1126)  baricitinib (OLUMIANT) tablet 4 mg (4 mg Oral Given 03/22/20 1123)  labetalol (NORMODYNE) injection 10 mg (has no administration in time range)  insulin glargine (LANTUS) injection 28 Units (has no administration in time range)  insulin glargine (LANTUS) injection 10 Units (10 Units Subcutaneous Not Given 03/22/20 1636)  enoxaparin (LOVENOX) injection 50 mg (has no administration in time range)  amLODipine (NORVASC) tablet 10 mg (10 mg Oral Given 03/22/20 1751)  pantoprazole (PROTONIX) EC tablet 40 mg (40  mg Oral Given 03/22/20 1752)  guaiFENesin (MUCINEX) 12 hr tablet 600 mg (600 mg Oral Given 03/22/20 1751)  insulin aspart (novoLOG) injection 0-20 Units (15 Units Subcutaneous Given 03/22/20 1754)  insulin aspart (novoLOG) injection 0-5 Units (has no administration in time range)  insulin aspart (novoLOG) injection 4 Units (4 Units Subcutaneous Given 03/22/20 1752)  methylPREDNISolone sodium succinate (SOLU-MEDROL) 125 mg/2 mL injection 60 mg (60 mg Intravenous Given 03/22/20 1747)  sodium chloride 0.9 % bolus 500 mL (0 mLs Intravenous Stopped 03/19/2020 2138)  remdesivir 100 mg in sodium chloride 0.9 % 100 mL IVPB (0 mg Intravenous Stopped 03/16/2020 2225)    Mobility walks Low fall risk   Focused Assessments    R Recommendations: See Admitting Provider Note  Report given to:  [facility] RN ICU  Additional  Notes:

## 2020-03-22 NOTE — ED Notes (Signed)
Pt called this nurse in the room, demanding a fan. Informed pt we could not pass out fans due to covid regulations; pt is demanding to be moved out of the room she is in.  Informed her the admitting doctor would be notified of her need to see her.

## 2020-03-23 ENCOUNTER — Inpatient Hospital Stay: Payer: Self-pay

## 2020-03-23 DIAGNOSIS — J1282 Pneumonia due to Coronavirus disease 2019: Secondary | ICD-10-CM | POA: Diagnosis not present

## 2020-03-23 DIAGNOSIS — U071 COVID-19: Secondary | ICD-10-CM | POA: Diagnosis not present

## 2020-03-23 LAB — COMPREHENSIVE METABOLIC PANEL
ALT: 26 U/L (ref 0–44)
AST: 57 U/L — ABNORMAL HIGH (ref 15–41)
Albumin: 3 g/dL — ABNORMAL LOW (ref 3.5–5.0)
Alkaline Phosphatase: 130 U/L — ABNORMAL HIGH (ref 38–126)
Anion gap: 11 (ref 5–15)
BUN: 30 mg/dL — ABNORMAL HIGH (ref 8–23)
CO2: 21 mmol/L — ABNORMAL LOW (ref 22–32)
Calcium: 8.5 mg/dL — ABNORMAL LOW (ref 8.9–10.3)
Chloride: 107 mmol/L (ref 98–111)
Creatinine, Ser: 0.83 mg/dL (ref 0.44–1.00)
GFR calc Af Amer: >60 mL/min (ref >60)
GFR calc non Af Amer: >60 mL/min (ref >60)
Glucose, Bld: 321 mg/dL — ABNORMAL HIGH (ref 70–99)
Potassium: 4.4 mmol/L (ref 3.5–5.1)
Sodium: 139 mmol/L (ref 135–145)
Total Bilirubin: 0.4 mg/dL (ref 0.3–1.2)
Total Protein: 6.9 g/dL (ref 6.5–8.1)

## 2020-03-23 LAB — MRSA PCR SCREENING: MRSA by PCR: NEGATIVE

## 2020-03-23 LAB — GLUCOSE, CAPILLARY
Glucose-Capillary: 306 mg/dL — ABNORMAL HIGH (ref 70–99)
Glucose-Capillary: 363 mg/dL — ABNORMAL HIGH (ref 70–99)
Glucose-Capillary: 309 mg/dL — ABNORMAL HIGH (ref 70–99)
Glucose-Capillary: 292 mg/dL — ABNORMAL HIGH (ref 70–99)

## 2020-03-23 LAB — MAGNESIUM: Magnesium: 2 mg/dL (ref 1.7–2.4)

## 2020-03-23 LAB — PHOSPHORUS: Phosphorus: 3.9 mg/dL (ref 2.5–4.6)

## 2020-03-23 MED ORDER — SODIUM CHLORIDE 0.9% FLUSH
10.0000 mL | Freq: Two times a day (BID) | INTRAVENOUS | Status: DC
  Administered 2020-03-23 – 2020-03-29 (×13): 10 mL
  Administered 2020-03-30: 12 mL
  Administered 2020-03-30 – 2020-03-31 (×2): 10 mL
  Administered 2020-04-01: 20 mL
  Administered 2020-04-01 – 2020-04-02 (×2): 10 mL
  Administered 2020-04-02: 20 mL
  Administered 2020-04-03: 10 mL
  Administered 2020-04-04: 20 mL
  Administered 2020-04-04: 10 mL

## 2020-03-23 MED ORDER — INSULIN GLARGINE 100 UNIT/ML ~~LOC~~ SOLN
22.0000 [IU] | Freq: Two times a day (BID) | SUBCUTANEOUS | Status: DC
  Administered 2020-03-23 – 2020-03-24 (×3): 22 [IU] via SUBCUTANEOUS
  Filled 2020-03-23 (×7): qty 0.22

## 2020-03-23 MED ORDER — SODIUM CHLORIDE 0.9% FLUSH: 10.0000 mL | INTRAVENOUS | Status: DC | PRN

## 2020-03-23 NOTE — Progress Notes (Signed)
Spoke with Elmarie Shiley, RN SWOT regarding PICC order.  Tiffany will follow up.

## 2020-03-23 NOTE — Progress Notes (Signed)
Peripherally Inserted Central Catheter Placement  The IV Nurse has discussed with the patient and/or persons authorized to consent for the patient, the purpose of this procedure and the potential benefits and risks involved with this procedure.  The benefits include less needle sticks, lab draws from the catheter, and the patient may be discharged home with the catheter. Risks include, but not limited to, infection, bleeding, blood clot (thrombus formation), and puncture of an artery; nerve damage and irregular heartbeat and possibility to perform a PICC exchange if needed/ordered by physician.  Alternatives to this procedure were also discussed.  Bard Power PICC patient education guide, fact sheet on infection prevention and patient information card has been provided to patient /or left at bedside.    PICC Placement Documentation  PICC Double Lumen 03/23/20 PICC Right Basilic 40 cm 0 cm (Active)  Indication for Insertion or Continuance of Line Prolonged intravenous therapies 03/23/20 2000  Exposed Catheter (cm) 0 cm 03/23/20 2000  Site Assessment Clean;Dry;Intact 03/23/20 2000  Lumen #1 Status Blood return noted;Flushed;Saline locked 03/23/20 2000  Lumen #2 Status Blood return noted;Flushed;Saline locked 03/23/20 2000  Dressing Type Transparent 03/23/20 2000  Dressing Status Clean;Dry;Intact 03/23/20 2000  Antimicrobial disc in place? Yes 03/23/20 2000  Safety Lock Not Applicable 03/23/20 2000  Line Care Connections checked and tightened 03/23/20 2000  Dressing Change Due 03/30/20 03/23/20 2000       Marisa Sprinkles 03/23/2020, 8:13 PM

## 2020-03-23 NOTE — Care Management Important Message (Signed)
Important Message  Patient Details  Name: [name] MRN: [id number] Date of Birth: 1955/08/23   Medicare Important Message Given:  Yes Lowanda Foster, RN agreed to deliver letter due to precautions)     Corey Harold 03/23/2020, 3:02 PM

## 2020-03-23 NOTE — Progress Notes (Signed)
Patient Demographics:    [name], is a 64 y.o. female, DOB - 08-15-1955, OAC:[id number]  Admit date - 03/10/2020   Admitting Physician Tina Shown, DO  Outpatient Primary MD for the patient is Tina Grippe, MD  LOS - 3  No chief complaint on file.       Subjective:    [name] today has no fevers, no emesis,  No chest pain,    -Hypoxia and Dyspnea persist--requiring  100 % HFNC at 60 Liters AND 15 NRB - -Patient is at times uncooperative, often will take off O2, patient tells me that she is not sick and from time to time indicates that she might consider going home --Repeatedly explained to patient that she is too sick to leave the hospital at this time----patient son also called and told the patient to stay in the hospital because she is too sick for any potential discharge at this time   Assessment  & Plan :    Principal Problem:   Pneumonia due to COVID-19 virus Active Problems:   Acute respiratory failure with hypoxia (HCC)   Severe sepsis (HCC)   Leukopenia   Thrombocytopenia (HCC)   Hyperglycemia due to diabetes mellitus (HCC)   Essential hypertension   Type 2 diabetes mellitus with hyperlipidemia (HCC)   Obesity (BMI 30-39.9)   Tobacco abuse   Lactic acidosis  Brief Summary:-  64 y.o. female with medical history significant for hypertension, type 2 DM, hyperlipidemia, obesity and tobacco abuse admitted on 03/14/2020 with acute hypoxic respiratory failure secondary to Covid Pneumonia -Patient is Not vaccinated against COVID-19 infection --Severe Hypoxia Persist and still requiring HFNC and NRB-   A/p 1)Acute hypoxic respiratory failure secondary to COVID-19 infection/Pneumonia--- The treatment plan and use of medications  for treatment of COVID-19 infection and possible side effects were discussed with patient/family -----Patient/Family verbalizes understanding and agrees to  treatment protocols --Patient currently on 60 L of heated high flow 100% oxygen along with nonrebreather bag at 15 L --Patient is positive for COVID-19 infection, chest x-ray with findings of infiltrates/opacities,  patient is tachypneic/hypoxic and requiring continuous supplemental oxygen---patient meets criteria for initiation of Remdesivir AND Steroid therapy per protocol  --Check and trend inflammatory markers including D-dimer, ferritin and  CRP---also follow CBC and CMP --Supplemental oxygen to keep O2 sats above 93% -Follow serial chest x-rays and ABGs as indicated --- Encourage prone positioning for More than 16 hours/day in increments of 2 to 3 hours at a time if able to tolerate --Attempt to maintain euvolemic state --Zinc and vitamin C as ordered -Albuterol inhaler as needed -Accu-Cheks/fingersticks while on high-dose steroids -PPI while on high-dose steroids -Enhanced dosage of anticoagulant for DVT prophylaxis given hypercoagulable state with COVID-19 infections -Continue IV Steroids and Remdesivir started on 03/11/2020 -Given persistent severe hypoxia and inflammatory markers-baricitinib started on 03/21/2020  COVID-19 Labs  Recent Labs    03/01/2020 2011 03/21/20 0719 03/22/20 0533 03/22/20 0732  DDIMER 1.15* 1.64*  --  1.21*  FERRITIN 595* 861* 1,618*  --   LDH 422*  --   --   --   CRP 5.4* 7.2* 7.3*  --     Lab Results  Component Value Date   SARSCOV2NAA POSITIVE (A) 02/22/2020   SARSCOV2NAA NEGATIVE  02/15/2019   Actemra/Baricitinib:- The patient has hypoxia and is high-risk for intubation, but expected to survive >48 hours and has good baseline functional status.  She is not known to be on immunomodulators, anti-rejection medications, or cancer chemotherapy, has no history of TB or latent TB, and no history of diverticulitis or intestinal perforation.  Platelets are >50K, ANC is >500, and ALT/AST are below 5x ULN with no known hepatitis B infection. Baricitinib is  being used under EUA by the FDA. The patient has no ESRD or AKI, known history of TB, severe neutropenia (ANC <500) or lymphopenia, or severe LFT elevations. They are not on DMARDs or probenecid, and are not pregnant. The option to use/refuse baricitinib treatment under FDA authorization (not approval), the significant known and potential risks and benefits, the extent to which these are unknown, and information regarding all available alternatives were discussed in detail. Specifically the risk of VTE and secondary infections were discussed in detail with the patient and/or HCPOA. They consent to proceed with treatment. - Continue Baricitinib (started 03/21/20), c/n 14 days or until hospital discharge. Monitor Cr, LFTs, differential. Keep on VTE ppx  2) COPD/tobacco abuse--- nicotine patch as ordered, steroids and bronchodilators as above #1  3)HLD-stopped Lipitor for now, watch LFTs closely with concomitant remdesivir and baricitinib use  4)HTN--currently on losartan, may use as needed IV labetalol for elevated BP  5) leukopenia and thrombocytopenia--- most likely related to underlying viral infection -Repeat CBC from 03/23/20 still pending  6) class II obesity- -after resolution of acute hypoxic respiratory failure and Covid infection patient will be advised to adhere to low calorie diet, portion control and increase physical activity discussed with patient -Body mass index is 36.09 kg/m.  7) hyperglycemia--- anticipate worsening with steroids, Use Novolog/Humalog Sliding scale insulin with Accu-Cheks/Fingersticks as ordered -Insulin regimen adjusted due to persistent hyperglycemia  8)Social/Ethics----- RN [name] at bedside--- we had extensive conversation with patient regarding goals of care and advanced directives -Patient repeatedly states myself and the RN at bedside that she wants to be a DNR/DNI----she understands that given her worsening respiratory status she may die- --I  called and updated patient's son ([name] at [phone] on patient's condition and patient's decision for DNR/DNI status  Disposition/Need for in-Hospital Stay- patient unable to be discharged at this time due to --severe hypoxic respiratory failure in the setting of COVID-19 pneumonia requiring IV remdesivir, IV steroids and supplemental oxygen  Status is: Inpatient  Remains inpatient appropriate because:severe hypoxic respiratory failure in the setting of COVID-19 pneumonia requiring IV remdesivir, IV steroids and supplemental oxygen   Disposition: The patient is from: Home              Anticipated d/c is to: Home              Anticipated d/c date is: > 3 days              Patient currently is not medically stable to d/c. Barriers: Not Clinically Stable- severe hypoxic respiratory failure in the setting of COVID-19 pneumonia requiring IV remdesivir, IV steroids and supplemental oxygen  Code Status : full  Family Communication:    (patient is alert, awake and coherent)  Discussed with pt's son Mr. Alisia Vanengen at [phone] on 03/22/20 AND again on  03/23/20  Consults  :  na  DVT Prophylaxis  :  Lovenox -  - SCDs    Lab Results  Component Value Date   PLT 186 03/22/2020  Inpatient Medications  Scheduled Meds: . albuterol  2 puff Inhalation Q6H  . amLODipine  10 mg Oral Daily  . vitamin C  500 mg Oral Daily  . baricitinib  4 mg Oral Daily  . Chlorhexidine Gluconate Cloth  6 each Topical Daily  . enoxaparin (LOVENOX) injection  50 mg Subcutaneous Q24H  . gabapentin  300 mg Oral TID  . guaiFENesin  600 mg Oral BID  . insulin aspart  0-20 Units Subcutaneous TID WC  . insulin aspart  0-5 Units Subcutaneous QHS  . insulin aspart  4 Units Subcutaneous TID WC  . insulin glargine  10 Units Subcutaneous Once  . insulin glargine  22 Units Subcutaneous BID  . mouth rinse  15 mL Mouth Rinse BID  . methylPREDNISolone (SOLU-MEDROL) injection  60 mg Intravenous Q12H   . nicotine  21 mg Transdermal Daily  . pantoprazole  40 mg Oral Daily  . zinc sulfate  220 mg Oral Daily   Continuous Infusions: . remdesivir 100 mg in NS 100 mL 100 mg (03/23/20 0917)   PRN Meds:.acetaminophen, chlorpheniramine-HYDROcodone, guaiFENesin-dextromethorphan, labetalol, ondansetron **OR** ondansetron (ZOFRAN) IV, phenol    Anti-infectives (From admission, onward)   Start     Dose/Rate Route Frequency Ordered Stop   03/21/20 1000  remdesivir 100 mg in sodium chloride 0.9 % 100 mL IVPB  Status:  Discontinued       "Followed by" Linked Group Details   100 mg 200 mL/hr over 30 Minutes Intravenous Daily 03/05/2020 2013 02/25/2020 2020   03/21/20 1000  remdesivir 100 mg in sodium chloride 0.9 % 100 mL IVPB        100 mg 200 mL/hr over 30 Minutes Intravenous Daily 02/26/2020 2025 03/25/20 0959   03/10/2020 2030  remdesivir 100 mg in sodium chloride 0.9 % 100 mL IVPB  Status:  Discontinued       "And" Linked Group Details   100 mg 200 mL/hr over 30 Minutes Intravenous Daily 03/21/2020 2016 02/24/2020 2019   03/09/2020 2030  remdesivir 100 mg in sodium chloride 0.9 % 100 mL IVPB  Status:  Discontinued       "And" Linked Group Details   100 mg 200 mL/hr over 30 Minutes Intravenous  Once 03/05/2020 2016 03/17/2020 2019   03/21/2020 2030  remdesivir 100 mg in sodium chloride 0.9 % 100 mL IVPB  Status:  Discontinued        100 mg 200 mL/hr over 30 Minutes Intravenous Daily 03/19/2020 2017 03/13/2020 2019   03/15/2020 2030  remdesivir 100 mg in sodium chloride 0.9 % 100 mL IVPB  Status:  Discontinued        100 mg 200 mL/hr over 30 Minutes Intravenous  Once 03/06/2020 2021 03/03/2020 2025   02/28/2020 2030  remdesivir 100 mg in sodium chloride 0.9 % 100 mL IVPB  Status:  Discontinued       "And" Linked Group Details   100 mg 200 mL/hr over 30 Minutes Intravenous Daily 03/13/2020 2023 03/21/20 1116   03/07/2020 2030  remdesivir 100 mg in sodium chloride 0.9 % 100 mL IVPB       "And" Linked Group Details   100  mg 200 mL/hr over 30 Minutes Intravenous  Once 03/18/2020 2023 03/13/2020 2225   02/29/2020 2013  remdesivir 200 mg in sodium chloride 0.9% 250 mL IVPB  Status:  Discontinued       "Followed by" Linked Group Details   200 mg 580 mL/hr over 30 Minutes Intravenous Once 03/21/2020 2013 03/19/2020  2020        Objective:   Vitals:   03/23/20 1000 03/23/20 1100 03/23/20 1200 03/23/20 1222  BP: (!) 157/83 (!) 154/75 (!) 146/72   Pulse: 80 74 78   Resp: (!) 35 (!) 22 (!) 25   Temp:    97.6 F (36.4 C)  TempSrc:    Oral  SpO2: (!) 82% 92% 91%   Weight:      Height:        Wt Readings from Last 3 Encounters:  03/22/20 89.5 kg  09/14/19 94.1 kg  05/05/19 91.6 kg     Intake/Output Summary (Last 24 hours) at 03/23/2020 1224 Last data filed at 03/22/2020 2030 Gross per 24 hour  Intake --  Output 800 ml  Net -800 ml    Physical Exam Gen:- Awake Alert, dyspnea on exertion HEENT:- [place].AT, No sclera icterus Nose- 100 % HFNC at 60 Liters AND 15 NRB Neck-Supple Neck,No JVD,.  Lungs-diminished breath sounds, scattered rhonchi CV- S1, S2 normal, regular  Abd-  +ve B.Sounds, Abd Soft, No tenderness,    Extremity/Skin:- No  edema, pedal pulses present  Psych-affect is appropriate, oriented x3 Neuro-generalized weakness, no new focal deficits, no tremors   Data Review:   Micro Results Recent Results (from the past 240 hour(s))  Respiratory Panel by RT PCR (Flu A&B, Covid) - Nasopharyngeal Swab     Status: Abnormal   Collection Time: 03/21/2020  5:35 PM   Specimen: Nasopharyngeal Swab  Result Value Ref Range Status   SARS Coronavirus 2 by RT PCR POSITIVE (A) NEGATIVE Final    Comment: RESULT CALLED TO, READ BACK BY AND VERIFIED WITH: BETHEL,SANDRA  ON 03/08/2020 BY JONES,T (NOTE) SARS-CoV-2 target nucleic acids are DETECTED.  SARS-CoV-2 RNA is generally detectable in upper respiratory specimens  during the acute phase of infection. Positive results are indicative of the presence of the  identified virus, but do not rule out bacterial infection or co-infection with other pathogens not detected by the test. Clinical correlation with patient history and other diagnostic information is necessary to determine patient infection status. The expected result is Negative.  Fact Sheet for Patients:  https://www.moore.com/  Fact Sheet for Healthcare Providers: https://www.young.biz/  This test is not yet approved or cleared by the [place] FDA and  has been authorized for detection and/or diagnosis of SARS-CoV-2 by FDA under an Emergency Use Authorization (EUA).  This EUA will remain in effect (meaning this test  can be used) for the duration of  the COVID-19 declaration under Section 564(b)(1) of the Act, 21 U.S.C. section 360bbb-3(b)(1), unless the authorization is terminated or revoked sooner.      Influenza A by PCR NEGATIVE NEGATIVE Final   Influenza B by PCR NEGATIVE NEGATIVE Final    Comment: (NOTE) The Xpert Xpress SARS-CoV-2/FLU/RSV assay is intended as an aid in  the diagnosis of influenza from Nasopharyngeal swab specimens and  should not be used as a sole basis for treatment. Nasal washings and  aspirates are unacceptable for Xpert Xpress SARS-CoV-2/FLU/RSV  testing.  Fact Sheet for Patients: https://www.moore.com/  Fact Sheet for Healthcare Providers: https://www.young.biz/  This test is not yet approved or cleared by the [place] FDA and  has been authorized for detection and/or diagnosis of SARS-CoV-2 by  FDA under an Emergency Use Authorization (EUA). This EUA will remain  in effect (meaning this test can be used) for the duration of the  Covid-19 declaration under Section 564(b)(1) of the Act, 21  U.S.C. section  360bbb-3(b)(1), unless the authorization is  terminated or revoked. Performed at [facility], [address] [place], [place] [address]   Blood Culture  (routine x 2)     Status: None (Preliminary result)   Collection Time: 03/12/2020  8:00 PM   Specimen: BLOOD LEFT ARM  Result Value Ref Range Status   Specimen Description BLOOD LEFT ARM  Final   Special Requests   Final    BOTTLES DRAWN AEROBIC AND ANAEROBIC Blood Culture adequate volume   Culture   Final    NO GROWTH 3 DAYS Performed at [facility], [address] [place], [place] [address]    Report Status PENDING  Incomplete  Blood Culture (routine x 2)     Status: None (Preliminary result)   Collection Time: 02/28/2020  8:11 PM   Specimen: BLOOD LEFT HAND  Result Value Ref Range Status   Specimen Description BLOOD LEFT HAND  Final   Special Requests   Final    BOTTLES DRAWN AEROBIC AND ANAEROBIC Blood Culture adequate volume   Culture   Final    NO GROWTH 3 DAYS Performed at [facility], [address] [place], [place] [address]    Report Status PENDING  Incomplete  MRSA PCR Screening     Status: None   Collection Time: 03/22/20 10:30 PM   Specimen: Nasal Mucosa; Nasopharyngeal  Result Value Ref Range Status   MRSA by PCR NEGATIVE NEGATIVE Final    Comment:        The GeneXpert MRSA Assay (FDA approved for NASAL specimens only), is one component of a comprehensive MRSA colonization surveillance program. It is not intended to diagnose MRSA infection nor to guide or monitor treatment for MRSA infections. Performed at [facility], [address] [place], [place] [address]     Radiology Reports DG Chest Portable 1 View  Result Date: 03/15/2020 CLINICAL DATA:  Weakness, shortness of breath and cough. EXAM: PORTABLE CHEST 1 VIEW COMPARISON:  March 09, 2018 FINDINGS: Moderate severity bilateral multifocal infiltrates are seen. There is no evidence of a pleural effusion or pneumothorax. The heart size and mediastinal contours are within normal limits. The visualized skeletal structures are unremarkable. IMPRESSION: Moderate severity bilateral multifocal infiltrates.  Electronically Signed   By: Aram Candelahaddeus  Houston M.D.   On: 02/25/2020 18:33   US EKG SITE RITE  Result Date: 03/23/2020 If Site Rite image not attached, placement could not be confirmed due to current cardiac rhythm.    CBC Recent Labs  Lab 03/09/2020 1711 03/21/20 0719 03/22/20 0732  WBC 3.9* 3.7* 6.1  HGB 14.6 14.2 14.0  HCT 44.1 45.0 42.0  PLT 141* 128* 186  MCV 85.8 90.4 85.2  MCH 28.4 28.5 28.4  MCHC 33.1 31.6 33.3  RDW 13.2 13.3 13.3  LYMPHSABS 0.8 0.9 1.0  MONOABS 0.4 0.3 0.3  EOSABS 0.0 0.0 0.0  BASOSABS 0.0 0.0 0.0    Chemistries  Recent Labs  Lab 03/13/2020 1711 03/21/20 0719 03/22/20 0732 03/23/20 0617  NA 132* 134* 137 139  K 4.0 4.3 3.9 4.4  CL 97* 102 103 107  CO2 20* 17* 23 21*  GLUCOSE 271* 346* 382* 321*  BUN 20 24* 33* 30*  CREATININE 0.99 0.95 0.94 0.83  CALCIUM 8.4* 8.5* 8.9 8.5*  MG  --  1.9 2.0 2.0  AST 50* 52* 64* 57*  ALT 27 27 30 26   ALKPHOS 70 67 106 130*  BILITOT 0.7 0.7 0.3 0.4    Recent Labs  03/10/2020 2011  TRIG 123    Lab Results  Component Value Date   HGBA1C 10.3 (H) 03/17/2020   ------------------------------------------------------------------------------------------------------------------ No results for input(s): TSH, T4TOTAL, T3FREE, THYROIDAB in the last 72 hours.  Invalid input(s): FREET3 ------------------------------------------------------------------------------------------------------------------ Recent Labs    03/21/20 0719 03/22/20 0533  FERRITIN 861* 1,618*    Coagulation profile No results for input(s): INR, PROTIME in the last 168 hours.  Recent Labs    03/21/20 0719 03/22/20 0732  DDIMER 1.64* 1.21*    Cardiac Enzymes No results for input(s): CKMB, TROPONINI, MYOGLOBIN in the last 168 hours.  Invalid input(s): CK ------------------------------------------------------------------------------------------------------------------ No results found for: BNP   Shon Hale M.D on  03/23/2020 at 12:24 PM  Go to [email or web address] - for contact info  [facility] - Office  [phone]

## 2020-03-23 NOTE — Progress Notes (Signed)
Inpatient Diabetes Program Recommendations  AACE/ADA: New Consensus Statement on Inpatient Glycemic Control   Target Ranges:  Prepandial:   less than 140 mg/dL      Peak postprandial:   less than 180 mg/dL (1-2 hours)      Critically ill patients:  140 - 180 mg/dL  Results for [name], [name] (MRN [id number]) as of 03/23/2020 07:46  Ref. Range 03/23/2020 06:17  Glucose Latest Ref Range: 70 - 99 mg/dL 702 (H)   Results for [name], [name] (MRN [id number]) as of 03/23/2020 07:46  Ref. Range 03/22/2020 11:30 03/22/2020 17:45 03/22/2020 21:49  Glucose-Capillary Latest Ref Range: 70 - 99 mg/dL 277 (H) 412 (H) 878 (H)   Review of Glycemic Control  Diabetes history: DM2 Outpatient Diabetes medications: Lantus 40 units QHS, Novolog 8 units TID with meals, Metformin 500 mg BID Current orders for Inpatient glycemic control: Lantus 28 units QHS, Novolog 0-20 units TID with meals, Novolog 0-5 units QHS, Novolog 4 units TID with meals; Solumedrol 60 mg Q12H  Inpatient Diabetes Program Recommendations:    Insulin: Please consider changing Lantus to 20 units BID (to start now) and increase meal coverage to Novolog 6 units TID with meals if patient eats at least 50% of meals.  Thanks, Orlando Penner, RN, MSN, CDE Diabetes Coordinator Inpatient Diabetes Program [phone] (Team Pager from 8am to 5pm)

## 2020-03-23 DEATH — deceased

## 2020-03-24 DIAGNOSIS — I10 Essential (primary) hypertension: Secondary | ICD-10-CM | POA: Diagnosis not present

## 2020-03-24 DIAGNOSIS — U071 COVID-19: Secondary | ICD-10-CM | POA: Diagnosis not present

## 2020-03-24 DIAGNOSIS — D72819 Decreased white blood cell count, unspecified: Secondary | ICD-10-CM | POA: Diagnosis not present

## 2020-03-24 DIAGNOSIS — E1165 Type 2 diabetes mellitus with hyperglycemia: Secondary | ICD-10-CM | POA: Diagnosis not present

## 2020-03-24 LAB — GLUCOSE, CAPILLARY
Glucose-Capillary: 288 mg/dL — ABNORMAL HIGH (ref 70–99)
Glucose-Capillary: 344 mg/dL — ABNORMAL HIGH (ref 70–99)
Glucose-Capillary: 260 mg/dL — ABNORMAL HIGH (ref 70–99)
Glucose-Capillary: 226 mg/dL — ABNORMAL HIGH (ref 70–99)

## 2020-03-24 MED ORDER — INSULIN GLARGINE 100 UNIT/ML ~~LOC~~ SOLN
30.0000 [IU] | Freq: Two times a day (BID) | SUBCUTANEOUS | Status: DC
  Administered 2020-03-24 – 2020-03-31 (×13): 30 [IU] via SUBCUTANEOUS
  Filled 2020-03-24 (×19): qty 0.3

## 2020-03-24 NOTE — Progress Notes (Signed)
Patient Demographics:    [name], is a 64 y.o. female, DOB - 05-27-1956, PJA:[id number]  Admit date - 03/14/2020   Admitting Physician Frankey Shown, DO  Outpatient Primary MD for the patient is Pearson Grippe, MD  LOS - 4  No chief complaint on file.       Subjective:    [name] says she is feeling better today.  Despite still requiring very high amounts of oxygen, she denies any shortness of breath.  She does have productive cough.   Assessment  & Plan :    Principal Problem:   Pneumonia due to COVID-19 virus Active Problems:   Acute respiratory failure with hypoxia (HCC)   Severe sepsis (HCC)   Leukopenia   Thrombocytopenia (HCC)   Hyperglycemia due to diabetes mellitus (HCC)   Essential hypertension   Type 2 diabetes mellitus with hyperlipidemia (HCC)   Obesity (BMI 30-39.9)   Tobacco abuse   Lactic acidosis  Brief Summary:-  64 y.o. female with medical history significant for hypertension, type 2 DM, hyperlipidemia, obesity and tobacco abuse admitted on 03/07/2020 with acute hypoxic respiratory failure secondary to Covid Pneumonia -Patient is Not vaccinated against COVID-19 infection --Severe Hypoxia Persist and still requiring HFNC and NRB-   A/p 1)Acute hypoxic respiratory failure secondary to COVID-19 infection/Pneumonia--- The treatment plan and use of medications  for treatment of COVID-19 infection and possible side effects were discussed with patient/family -----Patient/Family verbalizes understanding and agrees to treatment protocols --Patient currently on 40 L of heated high flow 100% oxygen along with nonrebreather bag at 15 L --Patient is positive for COVID-19 infection, chest x-ray with findings of infiltrates/opacities,  patient is tachypneic/hypoxic and requiring continuous supplemental oxygen---patient meets criteria for initiation of Remdesivir AND Steroid therapy  per protocol  --Check and trend inflammatory markers including D-dimer, ferritin and  CRP---also follow CBC and CMP --Supplemental oxygen to keep O2 sats above 93% -Follow serial chest x-rays and ABGs as indicated --- Encourage prone positioning for More than 16 hours/day in increments of 2 to 3 hours at a time if able to tolerate --Attempt to maintain euvolemic state --Zinc and vitamin C as ordered -Albuterol inhaler as needed -Accu-Cheks/fingersticks while on high-dose steroids -PPI while on high-dose steroids -Enhanced dosage of anticoagulant for DVT prophylaxis given hypercoagulable state with COVID-19 infections -Continue IV Steroids and Remdesivir started on 03/22/2020 -Given persistent severe hypoxia and inflammatory markers-baricitinib started on 03/21/2020  COVID-19 Labs  Recent Labs    03/22/20 0533 03/22/20 0732  DDIMER  --  1.21*  FERRITIN 1,618*  --   CRP 7.3*  --     Lab Results  Component Value Date   SARSCOV2NAA POSITIVE (A) 03/01/2020   SARSCOV2NAA NEGATIVE 02/15/2019   Actemra/Baricitinib:- The patient has hypoxia and is high-risk for intubation, but expected to survive >48 hours and has good baseline functional status.  She is not known to be on immunomodulators, anti-rejection medications, or cancer chemotherapy, has no history of TB or latent TB, and no history of diverticulitis or intestinal perforation.  Platelets are >50K, ANC is >500, and ALT/AST are below 5x ULN with no known hepatitis B infection. Baricitinib is being used under EUA by the FDA. The patient has no ESRD or AKI, known history of  TB, severe neutropenia (ANC <500) or lymphopenia, or severe LFT elevations. They are not on DMARDs or probenecid, and are not pregnant. The option to use/refuse baricitinib treatment under FDA authorization (not approval), the significant known and potential risks and benefits, the extent to which these are unknown, and information regarding all available alternatives were  discussed in detail. Specifically the risk of VTE and secondary infections were discussed in detail with the patient and/or HCPOA. They consent to proceed with treatment. - Continue Baricitinib (started 03/21/20), c/n 14 days or until hospital discharge. Monitor Cr, LFTs, differential. Keep on VTE ppx  2) COPD/tobacco abuse--- nicotine patch as ordered, steroids and bronchodilators as above #1  3)HLD-stopped Lipitor for now, watch LFTs closely with concomitant remdesivir and baricitinib use  4)HTN--currently on losartan, may use as needed IV labetalol for elevated BP  5) leukopenia and thrombocytopenia--- resolved on CBC from 9/30  6) class II obesity- -after resolution of acute hypoxic respiratory failure and Covid infection patient will be advised to adhere to low calorie diet, portion control and increase physical activity discussed with patient -Body mass index is 35.48 kg/m.  7) hyperglycemia--- anticipate worsening with steroids, Use Novolog/Humalog Sliding scale insulin with Accu-Cheks/Fingersticks as ordered -Insulin regimen adjusted due to persistent hyperglycemia  8)Social/Ethics----- RN [name] at bedside--- we had extensive conversation with patient regarding goals of care and advanced directives -Patient repeatedly states myself and the RN at bedside that she wants to be a DNR/DNI----she understands that given her worsening respiratory status she may die- --I called and updated patient's son (Mr. Sharryn Belding at [phone] on patient's condition and patient's decision for DNR/DNI status  Disposition/Need for in-Hospital Stay- patient unable to be discharged at this time due to --severe hypoxic respiratory failure in the setting of COVID-19 pneumonia requiring IV remdesivir, IV steroids and supplemental oxygen  Status is: Inpatient  Remains inpatient appropriate because:severe hypoxic respiratory failure in the setting of COVID-19 pneumonia requiring IV remdesivir,  IV steroids and supplemental oxygen   Disposition: The patient is from: Home              Anticipated d/c is to: Home              Anticipated d/c date is: > 3 days              Patient currently is not medically stable to d/c. Barriers: Not Clinically Stable- severe hypoxic respiratory failure in the setting of COVID-19 pneumonia requiring IV remdesivir, IV steroids and supplemental oxygen  Code Status : full  Family Communication:    (patient is alert, awake and coherent)  Discussed with pt's son Mr. Keelyn Monjaras at [phone] on 03/22/20 AND again on  03/23/20  Consults  :  na  DVT Prophylaxis  :  Lovenox -  - SCDs    Lab Results  Component Value Date   PLT 186 03/22/2020    Inpatient Medications  Scheduled Meds: . albuterol  2 puff Inhalation Q6H  . amLODipine  10 mg Oral Daily  . vitamin C  500 mg Oral Daily  . baricitinib  4 mg Oral Daily  . Chlorhexidine Gluconate Cloth  6 each Topical Daily  . enoxaparin (LOVENOX) injection  50 mg Subcutaneous Q24H  . gabapentin  300 mg Oral TID  . guaiFENesin  600 mg Oral BID  . insulin aspart  0-20 Units Subcutaneous TID WC  . insulin aspart  0-5 Units Subcutaneous QHS  . insulin aspart  4 Units Subcutaneous  TID WC  . insulin glargine  10 Units Subcutaneous Once  . insulin glargine  22 Units Subcutaneous BID  . mouth rinse  15 mL Mouth Rinse BID  . methylPREDNISolone (SOLU-MEDROL) injection  60 mg Intravenous Q12H  . nicotine  21 mg Transdermal Daily  . pantoprazole  40 mg Oral Daily  . sodium chloride flush  10-40 mL Intracatheter Q12H  . zinc sulfate  220 mg Oral Daily   Continuous Infusions:  PRN Meds:.acetaminophen, chlorpheniramine-HYDROcodone, guaiFENesin-dextromethorphan, labetalol, ondansetron **OR** ondansetron (ZOFRAN) IV, phenol, sodium chloride flush    Anti-infectives (From admission, onward)   Start     Dose/Rate Route Frequency Ordered Stop   03/21/20 1000  remdesivir 100 mg in sodium chloride 0.9 %  100 mL IVPB  Status:  Discontinued       "Followed by" Linked Group Details   100 mg 200 mL/hr over 30 Minutes Intravenous Daily 02/22/2020 2013 02/22/2020 2020   03/21/20 1000  remdesivir 100 mg in sodium chloride 0.9 % 100 mL IVPB        100 mg 200 mL/hr over 30 Minutes Intravenous Daily 03/14/2020 2025 03/24/20 0857   03/02/2020 2030  remdesivir 100 mg in sodium chloride 0.9 % 100 mL IVPB  Status:  Discontinued       "And" Linked Group Details   100 mg 200 mL/hr over 30 Minutes Intravenous Daily 03/07/2020 2016 03/22/2020 2019   03/10/2020 2030  remdesivir 100 mg in sodium chloride 0.9 % 100 mL IVPB  Status:  Discontinued       "And" Linked Group Details   100 mg 200 mL/hr over 30 Minutes Intravenous  Once 03/10/2020 2016 03/10/2020 2019   03/18/2020 2030  remdesivir 100 mg in sodium chloride 0.9 % 100 mL IVPB  Status:  Discontinued        100 mg 200 mL/hr over 30 Minutes Intravenous Daily 03/22/2020 2017 03/01/2020 2019   03/10/2020 2030  remdesivir 100 mg in sodium chloride 0.9 % 100 mL IVPB  Status:  Discontinued        100 mg 200 mL/hr over 30 Minutes Intravenous  Once 03/19/2020 2021 03/13/2020 2025   02/23/2020 2030  remdesivir 100 mg in sodium chloride 0.9 % 100 mL IVPB  Status:  Discontinued       "And" Linked Group Details   100 mg 200 mL/hr over 30 Minutes Intravenous Daily 03/04/2020 2023 03/21/20 1116   02/24/2020 2030  remdesivir 100 mg in sodium chloride 0.9 % 100 mL IVPB       "And" Linked Group Details   100 mg 200 mL/hr over 30 Minutes Intravenous  Once 03/09/2020 2023 03/02/2020 2225   03/06/2020 2013  remdesivir 200 mg in sodium chloride 0.9% 250 mL IVPB  Status:  Discontinued       "Followed by" Linked Group Details   200 mg 580 mL/hr over 30 Minutes Intravenous Once 02/26/2020 2013 03/16/2020 2020        Objective:   Vitals:   03/24/20 1600 03/24/20 1700 03/24/20 1800 03/24/20 1927  BP: (!) 160/83 (!) 161/93 (!) 149/76   Pulse: 86 84 78   Resp: (!) 34 (!) 21 (!) 21   Temp:      TempSrc:       SpO2: 95% 94% (!) 84% (!) 83%  Weight:      Height:        Wt Readings from Last 3 Encounters:  03/24/20 88 kg  09/14/19 94.1 kg  05/05/19 91.6 kg  Intake/Output Summary (Last 24 hours) at 03/24/2020 1935 Last data filed at 03/24/2020 [id number] Gross per 24 hour  Intake 110 ml  Output 500 ml  Net -390 ml    Physical Exam General exam: Alert, awake, oriented x 3 Respiratory system: Coarse breath sounds bilaterally. Respiratory effort normal. Cardiovascular system:RRR. No murmurs, rubs, gallops. Gastrointestinal system: Abdomen is nondistended, soft and nontender. No organomegaly or masses felt. Normal bowel sounds heard. Central nervous system: Alert and oriented. No focal neurological deficits. Extremities: No C/C/E, +pedal pulses Skin: No rashes, lesions or ulcers Psychiatry: Judgement and insight appear normal. Mood & affect appropriate.     Data Review:   Micro Results Recent Results (from the past 240 hour(s))  Respiratory Panel by RT PCR (Flu A&B, Covid) - Nasopharyngeal Swab     Status: Abnormal   Collection Time: 03-04-2020  5:35 PM   Specimen: Nasopharyngeal Swab  Result Value Ref Range Status   SARS Coronavirus 2 by RT PCR POSITIVE (A) NEGATIVE Final    Comment: RESULT CALLED TO, READ BACK BY AND VERIFIED WITH: BETHEL,SANDRA @1844  ON Nov 20, 2019 BY JONES,T (NOTE) SARS-CoV-2 target nucleic acids are DETECTED.  SARS-CoV-2 RNA is generally detectable in upper respiratory specimens  during the acute phase of infection. Positive results are indicative of the presence of the identified virus, but do not rule out bacterial infection or co-infection with other pathogens not detected by the test. Clinical correlation with patient history and other diagnostic information is necessary to determine patient infection status. The expected result is Negative.  Fact Sheet for Patients:  https://www.moore.com/https://www.fda.gov/media/142436/download  Fact Sheet for Healthcare  Providers: https://www.young.biz/https://www.fda.gov/media/142435/download  This test is not yet approved or cleared by the [place] FDA and  has been authorized for detection and/or diagnosis of SARS-CoV-2 by FDA under an Emergency Use Authorization (EUA).  This EUA will remain in effect (meaning this test  can be used) for the duration of  the COVID-19 declaration under Section 564(b)(1) of the Act, 21 U.S.C. section 360bbb-3(b)(1), unless the authorization is terminated or revoked sooner.      Influenza A by PCR NEGATIVE NEGATIVE Final   Influenza B by PCR NEGATIVE NEGATIVE Final    Comment: (NOTE) The Xpert Xpress SARS-CoV-2/FLU/RSV assay is intended as an aid in  the diagnosis of influenza from Nasopharyngeal swab specimens and  should not be used as a sole basis for treatment. Nasal washings and  aspirates are unacceptable for Xpert Xpress SARS-CoV-2/FLU/RSV  testing.  Fact Sheet for Patients: https://www.moore.com/https://www.fda.gov/media/142436/download  Fact Sheet for Healthcare Providers: https://www.young.biz/https://www.fda.gov/media/142435/download  This test is not yet approved or cleared by the [place] FDA and  has been authorized for detection and/or diagnosis of SARS-CoV-2 by  FDA under an Emergency Use Authorization (EUA). This EUA will remain  in effect (meaning this test can be used) for the duration of the  Covid-19 declaration under Section 564(b)(1) of the Act, 21  U.S.C. section 360bbb-3(b)(1), unless the authorization is  terminated or revoked. Performed at [facility], [address] [place], [place] [address]   Blood Culture (routine x 2)     Status: None (Preliminary result)   Collection Time: 03-04-2020  8:00 PM   Specimen: BLOOD LEFT ARM  Result Value Ref Range Status   Specimen Description BLOOD LEFT ARM  Final   Special Requests   Final    BOTTLES DRAWN AEROBIC AND ANAEROBIC Blood Culture adequate volume   Culture   Final    NO GROWTH 4 DAYS Performed at [facility], [address]  [place], [place] [address]    Report Status PENDING  Incomplete  Blood Culture (routine x 2)     Status: None (Preliminary result)   Collection Time: 03/21/2020  8:11 PM   Specimen: BLOOD LEFT HAND  Result Value Ref Range Status   Specimen Description BLOOD LEFT HAND  Final   Special Requests   Final    BOTTLES DRAWN AEROBIC AND ANAEROBIC Blood Culture adequate volume   Culture   Final    NO GROWTH 4 DAYS Performed at [facility], [address] [place], [place] [address]    Report Status PENDING  Incomplete  MRSA PCR Screening     Status: None   Collection Time: 03/22/20 10:30 PM   Specimen: Nasal Mucosa; Nasopharyngeal  Result Value Ref Range Status   MRSA by PCR NEGATIVE NEGATIVE Final    Comment:        The GeneXpert MRSA Assay (FDA approved for NASAL specimens only), is one component of a comprehensive MRSA colonization surveillance program. It is not intended to diagnose MRSA infection nor to guide or monitor treatment for MRSA infections. Performed at [facility], [address], [place], [place] [address]     Radiology Reports DG Chest Portable 1 View  Result Date: 03/13/2020 CLINICAL DATA:  Weakness, shortness of breath and cough. EXAM: PORTABLE CHEST 1 VIEW COMPARISON:  March 09, 2018 FINDINGS: Moderate severity bilateral multifocal infiltrates are seen. There is no evidence of a pleural effusion or pneumothorax. The heart size and mediastinal contours are within normal limits. The visualized skeletal structures are unremarkable. IMPRESSION: Moderate severity bilateral multifocal infiltrates. Electronically Signed   By: Aram Candela M.D.   On: 03/15/2020 18:33   [place] EKG SITE RITE  Result Date: 03/23/2020 If Site Rite image not attached, placement could not be confirmed due to current cardiac rhythm.    CBC Recent Labs  Lab 03/03/2020 1711 03/21/20 0719 03/22/20 0732  WBC 3.9* 3.7* 6.1  HGB 14.6 14.2 14.0  HCT 44.1 45.0 42.0  PLT 141* 128* 186  MCV 85.8  90.4 85.2  MCH 28.4 28.5 28.4  MCHC 33.1 31.6 33.3  RDW 13.2 13.3 13.3  LYMPHSABS 0.8 0.9 1.0  MONOABS 0.4 0.3 0.3  EOSABS 0.0 0.0 0.0  BASOSABS 0.0 0.0 0.0    Chemistries  Recent Labs  Lab 02/29/2020 1711 03/21/20 0719 03/22/20 0732 03/23/20 0617  NA 132* 134* 137 139  K 4.0 4.3 3.9 4.4  CL 97* 102 103 107  CO2 20* 17* 23 21*  GLUCOSE 271* 346* 382* 321*  BUN 20 24* 33* 30*  CREATININE 0.99 0.95 0.94 0.83  CALCIUM 8.4* 8.5* 8.9 8.5*  MG  --  1.9 2.0 2.0  AST 50* 52* 64* 57*  ALT ALKPHOS 70 67 106 130*  BILITOT 0.7 0.7 0.3 0.4    No results for input(s): CHOL, HDL, LDLCALC, TRIG, CHOLHDL, LDLDIRECT in the last 72 hours.  Lab Results  Component Value Date   HGBA1C 10.3 (H) 03/17/2020   ------------------------------------------------------------------------------------------------------------------ No results for input(s): TSH, T4TOTAL, T3FREE, THYROIDAB in the last 72 hours.  Invalid input(s): FREET3 ------------------------------------------------------------------------------------------------------------------ Recent Labs    03/22/20 0533  FERRITIN 1,618*    Coagulation profile No results for input(s): INR, PROTIME in the last 168 hours.  Recent Labs    03/22/20 0732  DDIMER 1.21*    Cardiac Enzymes No results for input(s): CKMB, TROPONINI, MYOGLOBIN in the last 168 hours.  Invalid  input(s): CK ------------------------------------------------------------------------------------------------------------------ No results found for: BNP   Erick Blinks M.D on 03/24/2020 at 7:35 PM  Go to [email or web address] - for contact info  [facility] - Office  [phone]

## 2020-03-25 DIAGNOSIS — I10 Essential (primary) hypertension: Secondary | ICD-10-CM | POA: Diagnosis not present

## 2020-03-25 DIAGNOSIS — E1165 Type 2 diabetes mellitus with hyperglycemia: Secondary | ICD-10-CM | POA: Diagnosis not present

## 2020-03-25 DIAGNOSIS — U071 COVID-19: Secondary | ICD-10-CM | POA: Diagnosis not present

## 2020-03-25 DIAGNOSIS — D72819 Decreased white blood cell count, unspecified: Secondary | ICD-10-CM | POA: Diagnosis not present

## 2020-03-25 LAB — COMPREHENSIVE METABOLIC PANEL
ALT: 24 U/L (ref 0–44)
AST: 40 U/L (ref 15–41)
Albumin: 2.8 g/dL — ABNORMAL LOW (ref 3.5–5.0)
Alkaline Phosphatase: 144 U/L — ABNORMAL HIGH (ref 38–126)
Anion gap: 9 (ref 5–15)
BUN: 25 mg/dL — ABNORMAL HIGH (ref 8–23)
CO2: 25 mmol/L (ref 22–32)
Calcium: 8.3 mg/dL — ABNORMAL LOW (ref 8.9–10.3)
Chloride: 103 mmol/L (ref 98–111)
Creatinine, Ser: 0.66 mg/dL (ref 0.44–1.00)
GFR calc Af Amer: >60 mL/min (ref >60)
GFR calc non Af Amer: >60 mL/min (ref >60)
Glucose, Bld: 121 mg/dL — ABNORMAL HIGH (ref 70–99)
Potassium: 4.1 mmol/L (ref 3.5–5.1)
Sodium: 137 mmol/L (ref 135–145)
Total Bilirubin: 0.3 mg/dL (ref 0.3–1.2)
Total Protein: 6.6 g/dL (ref 6.5–8.1)

## 2020-03-25 LAB — CBC WITH DIFFERENTIAL/PLATELET
Abs Immature Granulocytes: 0.13 10*3/uL — ABNORMAL HIGH (ref 0.00–0.07)
Basophils Absolute: 0 10*3/uL (ref 0.0–0.1)
Basophils Relative: 0 %
Eosinophils Absolute: 0 10*3/uL (ref 0.0–0.5)
Eosinophils Relative: 0 %
HCT: 39.1 % (ref 36.0–46.0)
Hemoglobin: 13.2 g/dL (ref 12.0–15.0)
Immature Granulocytes: 1 %
Lymphocytes Relative: 10 %
Lymphs Abs: 1 10*3/uL (ref 0.7–4.0)
MCH: 28.5 pg (ref 26.0–34.0)
MCHC: 33.8 g/dL (ref 30.0–36.0)
MCV: 84.4 fL (ref 80.0–100.0)
Monocytes Absolute: 0.6 10*3/uL (ref 0.1–1.0)
Monocytes Relative: 7 %
Neutro Abs: 7.6 10*3/uL (ref 1.7–7.7)
Neutrophils Relative %: 82 %
Platelets: 186 10*3/uL (ref 150–400)
RBC: 4.63 MIL/uL (ref 3.87–5.11)
RDW: 13.1 % (ref 11.5–15.5)
WBC: 9.3 10*3/uL (ref 4.0–10.5)
nRBC: 0 % (ref 0.0–0.2)

## 2020-03-25 LAB — CULTURE, BLOOD (ROUTINE X 2)
Culture: NO GROWTH
Culture: NO GROWTH
Special Requests: ADEQUATE
Special Requests: ADEQUATE

## 2020-03-25 LAB — D-DIMER, QUANTITATIVE: D-Dimer, Quant: 0.86 ug/mL-FEU — ABNORMAL HIGH (ref 0.00–0.50)

## 2020-03-25 LAB — GLUCOSE, CAPILLARY
Glucose-Capillary: 130 mg/dL — ABNORMAL HIGH (ref 70–99)
Glucose-Capillary: 286 mg/dL — ABNORMAL HIGH (ref 70–99)
Glucose-Capillary: 319 mg/dL — ABNORMAL HIGH (ref 70–99)
Glucose-Capillary: 246 mg/dL — ABNORMAL HIGH (ref 70–99)

## 2020-03-25 LAB — MAGNESIUM: Magnesium: 1.8 mg/dL (ref 1.7–2.4)

## 2020-03-25 LAB — FERRITIN: Ferritin: 1549 ng/mL — ABNORMAL HIGH (ref 11–307)

## 2020-03-25 LAB — PHOSPHORUS: Phosphorus: 3.7 mg/dL (ref 2.5–4.6)

## 2020-03-25 LAB — C-REACTIVE PROTEIN: CRP: 4.3 mg/dL — ABNORMAL HIGH (ref <1.0)

## 2020-03-25 MED ORDER — FUROSEMIDE 10 MG/ML IJ SOLN
20.0000 mg | Freq: Once | INTRAMUSCULAR | Status: AC
  Administered 2020-03-25: 20 mg via INTRAVENOUS
  Filled 2020-03-25: qty 2

## 2020-03-25 NOTE — Progress Notes (Signed)
With assist by nurse tech and RT, patient able to get up to [facility] and is currently sitting up in chair eating breakfast. On 100% FiO2 and 40 L Heated HF patient saturating 90%. Patient will intermittently take non-rebreather on/off as needed.

## 2020-03-25 NOTE — Progress Notes (Signed)
Patient Demographics:    [name], is a 64 y.o. female, DOB - 24-Nov-1955, ZOX:[id number]  Admit date - 03/11/2020   Admitting Physician Frankey Shown, DO  Outpatient Primary MD for the patient is Pearson Grippe, MD  LOS - 5  No chief complaint on file.       Subjective:    [name] continues to require high oxygen with high flow and nonrebreather mask.  Since that her overall breathing is improving.  She has not been able to get out of bed.   Assessment  & Plan :    Principal Problem:   Pneumonia due to COVID-19 virus Active Problems:   Acute respiratory failure with hypoxia (HCC)   Severe sepsis (HCC)   Leukopenia   Thrombocytopenia (HCC)   Hyperglycemia due to diabetes mellitus (HCC)   Essential hypertension   Type 2 diabetes mellitus with hyperlipidemia (HCC)   Obesity (BMI 30-39.9)   Tobacco abuse   Lactic acidosis  Brief Summary:-  64 y.o. female with medical history significant for hypertension, type 2 DM, hyperlipidemia, obesity and tobacco abuse admitted on 03/19/2020 with acute hypoxic respiratory failure secondary to Covid Pneumonia -Patient is Not vaccinated against COVID-19 infection --Severe Hypoxia Persist and still requiring HFNC and NRB-   A/p 1)Acute hypoxic respiratory failure secondary to COVID-19 infection/Pneumonia--- The treatment plan and use of medications  for treatment of COVID-19 infection and possible side effects were discussed with patient/family -----Patient/Family verbalizes understanding and agrees to treatment protocols --Patient currently on 40 L of heated high flow 100% oxygen along with nonrebreather bag at 15 L --Patient is positive for COVID-19 infection, chest x-ray with findings of infiltrates/opacities,  patient is tachypneic/hypoxic and requiring continuous supplemental oxygen---patient meets criteria for initiation of Remdesivir AND Steroid  therapy per protocol  --Check and trend inflammatory markers including D-dimer, ferritin and  CRP---also follow CBC and CMP --Supplemental oxygen to keep O2 sats above 93% -Follow serial chest x-rays and ABGs as indicated --- Encourage prone positioning for More than 16 hours/day in increments of 2 to 3 hours at a time if able to tolerate --Attempt to maintain euvolemic state --Zinc and vitamin C as ordered -Albuterol inhaler as needed -Accu-Cheks/fingersticks while on high-dose steroids -PPI while on high-dose steroids -Enhanced dosage of anticoagulant for DVT prophylaxis given hypercoagulable state with COVID-19 infections -Continue IV Steroids started on 03/08/2020 -She has completed a course of remdesivir -Given persistent severe hypoxia and inflammatory markers-baricitinib started on 03/21/2020  COVID-19 Labs  Recent Labs    03/25/20 0436  DDIMER 0.86*  FERRITIN 1,549*  CRP 4.3*    Lab Results  Component Value Date   SARSCOV2NAA POSITIVE (A) 02/26/2020   SARSCOV2NAA NEGATIVE 02/15/2019   Actemra/Baricitinib:- The patient has hypoxia and is high-risk for intubation, but expected to survive >48 hours and has good baseline functional status.  She is not known to be on immunomodulators, anti-rejection medications, or cancer chemotherapy, has no history of TB or latent TB, and no history of diverticulitis or intestinal perforation.  Platelets are >50K, ANC is >500, and ALT/AST are below 5x ULN with no known hepatitis B infection. Baricitinib is being used under EUA by the FDA. The patient has no ESRD or AKI, known history of TB, severe neutropenia (  ANC <500) or lymphopenia, or severe LFT elevations. They are not on DMARDs or probenecid, and are not pregnant. The option to use/refuse baricitinib treatment under FDA authorization (not approval), the significant known and potential risks and benefits, the extent to which these are unknown, and information regarding all available  alternatives were discussed in detail. Specifically the risk of VTE and secondary infections were discussed in detail with the patient and/or HCPOA. They consent to proceed with treatment. - Continue Baricitinib (started 03/21/20), c/n 14 days or until hospital discharge. Monitor Cr, LFTs, differential. Keep on VTE ppx  2) COPD/tobacco abuse--- nicotine patch as ordered, steroids and bronchodilators as above #1  3)HLD-stopped Lipitor for now, watch LFTs closely with baricitinib use  4)HTN--currently on losartan, may use as needed IV labetalol for elevated BP  5) leukopenia and thrombocytopenia--- resolved on CBC from 9/30  6) class II obesity- -after resolution of acute hypoxic respiratory failure and Covid infection patient will be advised to adhere to low calorie diet, portion control and increase physical activity discussed with patient -Body mass index is 35.48 kg/m.  7) hyperglycemia--- anticipate worsening with steroids, Use Novolog/Humalog Sliding scale insulin with Accu-Cheks/Fingersticks as ordered -Insulin regimen adjusted due to persistent hyperglycemia  8)Social/Ethics----- RN [name] at bedside--- we had extensive conversation with patient regarding goals of care and advanced directives -Patient repeatedly states myself and the RN at bedside that she wants to be a DNR/DNI----she understands that given her worsening respiratory status she may die- --I called and updated patient's son ([name] at [phone] on patient's condition and patient's decision for DNR/DNI status  Disposition/Need for in-Hospital Stay- patient unable to be discharged at this time due to --severe hypoxic respiratory failure in the setting of COVID-19 pneumonia requiring I IV steroids and supplemental oxygen  Status is: Inpatient  Remains inpatient appropriate because:severe hypoxic respiratory failure in the setting of COVID-19 pneumonia requiring  IV steroids and supplemental  oxygen   Disposition: The patient is from: Home              Anticipated d/c is to: Home              Anticipated d/c date is: > 3 days              Patient currently is not medically stable to d/c. Barriers: Not Clinically Stable- severe hypoxic respiratory failure in the setting of COVID-19 pneumonia requiring  IV steroids and supplemental oxygen  Code Status : full  Family Communication:    (patient is alert, awake and coherent)  Discussed with pt's son [name] at [phone] 0n 10/3  Consults  :  na  DVT Prophylaxis  :  Lovenox -  - SCDs    Lab Results  Component Value Date   PLT 186 03/25/2020    Inpatient Medications  Scheduled Meds: . albuterol  2 puff Inhalation Q6H  . amLODipine  10 mg Oral Daily  . vitamin C  500 mg Oral Daily  . baricitinib  4 mg Oral Daily  . Chlorhexidine Gluconate Cloth  6 each Topical Daily  . enoxaparin (LOVENOX) injection  50 mg Subcutaneous Q24H  . furosemide  20 mg Intravenous Once  . gabapentin  300 mg Oral TID  . guaiFENesin  600 mg Oral BID  . insulin aspart  0-20 Units Subcutaneous TID WC  . insulin aspart  0-5 Units Subcutaneous QHS  . insulin aspart  4 Units Subcutaneous TID WC  . insulin glargine  30 Units Subcutaneous BID  . mouth rinse  15 mL Mouth Rinse BID  . methylPREDNISolone (SOLU-MEDROL) injection  60 mg Intravenous Q12H  . nicotine  21 mg Transdermal Daily  . pantoprazole  40 mg Oral Daily  . sodium chloride flush  10-40 mL Intracatheter Q12H  . zinc sulfate  220 mg Oral Daily   Continuous Infusions:  PRN Meds:.acetaminophen, chlorpheniramine-HYDROcodone, guaiFENesin-dextromethorphan, labetalol, ondansetron **OR** ondansetron (ZOFRAN) IV, phenol, sodium chloride flush    Anti-infectives (From admission, onward)   Start     Dose/Rate Route Frequency Ordered Stop   03/21/20 1000  remdesivir 100 mg in sodium chloride 0.9 % 100 mL IVPB  Status:  Discontinued       "Followed by" Linked Group Details    100 mg 200 mL/hr over 30 Minutes Intravenous Daily 04/10/2020 2013 2020-04-10 2020   03/21/20 1000  remdesivir 100 mg in sodium chloride 0.9 % 100 mL IVPB        100 mg 200 mL/hr over 30 Minutes Intravenous Daily 10-Apr-2020 2025 03/24/20 0857   2020/04/10 2030  remdesivir 100 mg in sodium chloride 0.9 % 100 mL IVPB  Status:  Discontinued       "And" Linked Group Details   100 mg 200 mL/hr over 30 Minutes Intravenous Daily 04-10-20 2016 10-Apr-2020 2019   Apr 10, 2020 2030  remdesivir 100 mg in sodium chloride 0.9 % 100 mL IVPB  Status:  Discontinued       "And" Linked Group Details   100 mg 200 mL/hr over 30 Minutes Intravenous  Once 10-Apr-2020 2016 2020/04/10 2019   04-10-2020 2030  remdesivir 100 mg in sodium chloride 0.9 % 100 mL IVPB  Status:  Discontinued        100 mg 200 mL/hr over 30 Minutes Intravenous Daily 04-10-20 2017 04-10-2020 2019   10-Apr-2020 2030  remdesivir 100 mg in sodium chloride 0.9 % 100 mL IVPB  Status:  Discontinued        100 mg 200 mL/hr over 30 Minutes Intravenous  Once 04/10/2020 2021 2020/04/10 2025   10-Apr-2020 2030  remdesivir 100 mg in sodium chloride 0.9 % 100 mL IVPB  Status:  Discontinued       "And" Linked Group Details   100 mg 200 mL/hr over 30 Minutes Intravenous Daily 2020/04/10 2023 03/21/20 1116   2020/04/10 2030  remdesivir 100 mg in sodium chloride 0.9 % 100 mL IVPB       "And" Linked Group Details   100 mg 200 mL/hr over 30 Minutes Intravenous  Once 10-Apr-2020 2023 April 10, 2020 2225   10-Apr-2020 2013  remdesivir 200 mg in sodium chloride 0.9% 250 mL IVPB  Status:  Discontinued       "Followed by" Linked Group Details   200 mg 580 mL/hr over 30 Minutes Intravenous Once 2020-04-10 2013 04-10-20 2020        Objective:   Vitals:   03/25/20 1000 03/25/20 1100 03/25/20 1300 03/25/20 1358  BP: 121/78 137/73    Pulse:      Resp: 20 17    Temp:   97.7 F (36.5 C)   TempSrc:   Oral   SpO2:    (!) 81%  Weight:      Height:        Wt Readings from Last 3 Encounters:  03/24/20  88 kg  09/14/19 94.1 kg  05/05/19 91.6 kg     Intake/Output Summary (Last 24 hours) at 03/25/2020 1800 Last data filed at 03/25/2020 0837 Gross per 24  hour  Intake 10 ml  Output 1200 ml  Net -1190 ml    Physical Exam General exam: Alert, awake, oriented x 3 Respiratory system: Coarse breath sounds bilaterally. Respiratory effort normal. Cardiovascular system:RRR. No murmurs, rubs, gallops. Gastrointestinal system: Abdomen is nondistended, soft and nontender. No organomegaly or masses felt. Normal bowel sounds heard. Central nervous system: Alert and oriented. No focal neurological deficits. Extremities: No C/C/E, +pedal pulses Skin: No rashes, lesions or ulcers Psychiatry: Judgement and insight appear normal. Mood & affect appropriate.      Data Review:   Micro Results Recent Results (from the past 240 hour(s))  Respiratory Panel by RT PCR (Flu A&B, Covid) - Nasopharyngeal Swab     Status: Abnormal   Collection Time: 03/19/2020  5:35 PM   Specimen: Nasopharyngeal Swab  Result Value Ref Range Status   SARS Coronavirus 2 by RT PCR POSITIVE (A) NEGATIVE Final    Comment: RESULT CALLED TO, READ BACK BY AND VERIFIED WITH: BETHEL,SANDRA @1844  ON 03/06/2020 BY JONES,T (NOTE) SARS-CoV-2 target nucleic acids are DETECTED.  SARS-CoV-2 RNA is generally detectable in upper respiratory specimens  during the acute phase of infection. Positive results are indicative of the presence of the identified virus, but do not rule out bacterial infection or co-infection with other pathogens not detected by the test. Clinical correlation with patient history and other diagnostic information is necessary to determine patient infection status. The expected result is Negative.  Fact Sheet for Patients:  https://www.moore.com/https://www.fda.gov/media/142436/download  Fact Sheet for Healthcare Providers: https://www.young.biz/https://www.fda.gov/media/142435/download  This test is not yet approved or cleared by the [place] FDA and   has been authorized for detection and/or diagnosis of SARS-CoV-2 by FDA under an Emergency Use Authorization (EUA).  This EUA will remain in effect (meaning this test  can be used) for the duration of  the COVID-19 declaration under Section 564(b)(1) of the Act, 21 U.S.C. section 360bbb-3(b)(1), unless the authorization is terminated or revoked sooner.      Influenza A by PCR NEGATIVE NEGATIVE Final   Influenza B by PCR NEGATIVE NEGATIVE Final    Comment: (NOTE) The Xpert Xpress SARS-CoV-2/FLU/RSV assay is intended as an aid in  the diagnosis of influenza from Nasopharyngeal swab specimens and  should not be used as a sole basis for treatment. Nasal washings and  aspirates are unacceptable for Xpert Xpress SARS-CoV-2/FLU/RSV  testing.  Fact Sheet for Patients: https://www.moore.com/https://www.fda.gov/media/142436/download  Fact Sheet for Healthcare Providers: https://www.young.biz/https://www.fda.gov/media/142435/download  This test is not yet approved or cleared by the [place] FDA and  has been authorized for detection and/or diagnosis of SARS-CoV-2 by  FDA under an Emergency Use Authorization (EUA). This EUA will remain  in effect (meaning this test can be used) for the duration of the  Covid-19 declaration under Section 564(b)(1) of the Act, 21  U.S.C. section 360bbb-3(b)(1), unless the authorization is  terminated or revoked. Performed at [facility], [address] [place], [place] [address]   Blood Culture (routine x 2)     Status: None   Collection Time: 03/11/2020  8:00 PM   Specimen: BLOOD LEFT ARM  Result Value Ref Range Status   Specimen Description BLOOD LEFT ARM  Final   Special Requests   Final    BOTTLES DRAWN AEROBIC AND ANAEROBIC Blood Culture adequate volume   Culture   Final    NO GROWTH 5 DAYS Performed at [facility], [address] [place], [place] [address]    Report Status 03/25/2020 FINAL  Final  Blood  Culture (routine x 2)     Status: None   Collection Time: 03/05/2020  8:11 PM    Specimen: BLOOD LEFT HAND  Result Value Ref Range Status   Specimen Description BLOOD LEFT HAND  Final   Special Requests   Final    BOTTLES DRAWN AEROBIC AND ANAEROBIC Blood Culture adequate volume   Culture   Final    NO GROWTH 5 DAYS Performed at [facility], [address] [place], [place] [address]    Report Status 03/25/2020 FINAL  Final  MRSA PCR Screening     Status: None   Collection Time: 03/22/20 10:30 PM   Specimen: Nasal Mucosa; Nasopharyngeal  Result Value Ref Range Status   MRSA by PCR NEGATIVE NEGATIVE Final    Comment:        The GeneXpert MRSA Assay (FDA approved for NASAL specimens only), is one component of a comprehensive MRSA colonization surveillance program. It is not intended to diagnose MRSA infection nor to guide or monitor treatment for MRSA infections. Performed at [facility], [address] [place], [place] [address]     Radiology Reports DG Chest Portable 1 View  Result Date: 03/01/2020 CLINICAL DATA:  Weakness, shortness of breath and cough. EXAM: PORTABLE CHEST 1 VIEW COMPARISON:  March 09, 2018 FINDINGS: Moderate severity bilateral multifocal infiltrates are seen. There is no evidence of a pleural effusion or pneumothorax. The heart size and mediastinal contours are within normal limits. The visualized skeletal structures are unremarkable. IMPRESSION: Moderate severity bilateral multifocal infiltrates. Electronically Signed   By: Aram Candela M.D.   On: 02/28/2020 18:33   [place] EKG SITE RITE  Result Date: 03/23/2020 If Site Rite image not attached, placement could not be confirmed due to current cardiac rhythm.    CBC Recent Labs  Lab 03/06/2020 1711 03/21/20 0719 03/22/20 0732 03/25/20 0436  WBC 3.9* 3.7* 6.1 9.3  HGB 14.6 14.2 14.0 13.2  HCT 44.1 45.0 42.0 39.1  PLT 141* 128* 186 186  MCV 85.8 90.4 85.2 84.4  MCH 28.4 28.5 28.4 28.5  MCHC 33.1 31.6 33.3 33.8  RDW 13.2 13.3 13.3 13.1  LYMPHSABS 0.8 0.9 1.0 1.0  MONOABS  0.4 0.3 0.3 0.6  EOSABS 0.0 0.0 0.0 0.0  BASOSABS 0.0 0.0 0.0 0.0    Chemistries  Recent Labs  Lab 03/11/2020 1711 03/21/20 0719 03/22/20 0732 03/23/20 0617 03/25/20 0436  NA 132* 134* 137 139 137  K 4.0 4.3 3.9 4.4 4.1  CL 97* 102 103 107 103  CO2 20* 17* 23 21* 25  GLUCOSE 271* 346* 382* 321* 121*  BUN 20 24* 33* 30* 25*  CREATININE 0.99 0.95 0.94 0.83 0.66  CALCIUM 8.4* 8.5* 8.9 8.5* 8.3*  MG  --  1.9 2.0 2.0 1.8  AST 50* 52* 64* 57* 40  ALT 27 27 30 26 24   ALKPHOS 70 67 106 130* 144*  BILITOT 0.7 0.7 0.3 0.4 0.3    No results for input(s): CHOL, HDL, LDLCALC, TRIG, CHOLHDL, LDLDIRECT in the last 72 hours.  Lab Results  Component Value Date   HGBA1C 10.3 (H) 03/13/2020   ------------------------------------------------------------------------------------------------------------------ No results for input(s): TSH, T4TOTAL, T3FREE, THYROIDAB in the last 72 hours.  Invalid input(s): FREET3 ------------------------------------------------------------------------------------------------------------------ Recent Labs    03/25/20 0436  FERRITIN 1,549*    Coagulation profile No results for input(s): INR, PROTIME in the last 168 hours.  Recent Labs    03/25/20 0436  DDIMER 0.86*    Cardiac Enzymes No results for  input(s): CKMB, TROPONINI, MYOGLOBIN in the last 168 hours.  Invalid input(s): CK ------------------------------------------------------------------------------------------------------------------ No results found for: BNP   Erick Blinks M.D on 03/25/2020 at 6:00 PM  Go to [email or web address] - for contact info  [facility] - Office  [phone]

## 2020-03-26 DIAGNOSIS — I10 Essential (primary) hypertension: Secondary | ICD-10-CM | POA: Diagnosis not present

## 2020-03-26 DIAGNOSIS — D72819 Decreased white blood cell count, unspecified: Secondary | ICD-10-CM | POA: Diagnosis not present

## 2020-03-26 DIAGNOSIS — E1165 Type 2 diabetes mellitus with hyperglycemia: Secondary | ICD-10-CM | POA: Diagnosis not present

## 2020-03-26 DIAGNOSIS — U071 COVID-19: Secondary | ICD-10-CM | POA: Diagnosis not present

## 2020-03-26 LAB — BLOOD GAS, ARTERIAL
Acid-Base Excess: 3.9 mmol/L — ABNORMAL HIGH (ref 0.0–2.0)
Bicarbonate: 27.7 mmol/L (ref 20.0–28.0)
FIO2: 100
O2 Saturation: 90.4 %
Patient temperature: 37
pCO2 arterial: 40.4 mmHg (ref 32.0–48.0)
pH, Arterial: 7.451 — ABNORMAL HIGH (ref 7.350–7.450)
pO2, Arterial: 61.1 mmHg — ABNORMAL LOW (ref 83.0–108.0)

## 2020-03-26 LAB — GLUCOSE, CAPILLARY
Glucose-Capillary: 240 mg/dL — ABNORMAL HIGH (ref 70–99)
Glucose-Capillary: 291 mg/dL — ABNORMAL HIGH (ref 70–99)
Glucose-Capillary: 198 mg/dL — ABNORMAL HIGH (ref 70–99)
Glucose-Capillary: 197 mg/dL — ABNORMAL HIGH (ref 70–99)

## 2020-03-26 MED ORDER — INSULIN ASPART 100 UNIT/ML ~~LOC~~ SOLN
6.0000 [IU] | Freq: Three times a day (TID) | SUBCUTANEOUS | Status: DC
  Administered 2020-03-27 – 2020-03-29 (×8): 6 [IU] via SUBCUTANEOUS

## 2020-03-26 MED ORDER — FUROSEMIDE 10 MG/ML IJ SOLN
40.0000 mg | Freq: Once | INTRAMUSCULAR | Status: AC
  Administered 2020-03-26: 40 mg via INTRAVENOUS
  Filled 2020-03-26: qty 4

## 2020-03-26 NOTE — Progress Notes (Signed)
RN called and stated that the patients SPO2 had started dropping. Increased FIO2 back to 100 and 40L.

## 2020-03-26 NOTE — Progress Notes (Signed)
Patient Demographics:    [name], is a 64 y.o. female, DOB - 1955/09/23, HMC:[id number]  Admit date - 03/14/2020   Admitting Physician Frankey Shown, DO  Outpatient Primary MD for the patient is Pearson Grippe, MD  LOS - 6  No chief complaint on file.       Subjective:    [name] continues to require heated high flow nasal cannula and nonrebreather.  Wants to get up in chair today.  Still having shortness of breath.   Assessment  & Plan :    Principal Problem:   Pneumonia due to COVID-19 virus Active Problems:   Acute respiratory failure with hypoxia (HCC)   Severe sepsis (HCC)   Leukopenia   Thrombocytopenia (HCC)   Hyperglycemia due to diabetes mellitus (HCC)   Essential hypertension   Type 2 diabetes mellitus with hyperlipidemia (HCC)   Obesity (BMI 30-39.9)   Tobacco abuse   Lactic acidosis  Brief Summary:-  64 y.o. female with medical history significant for hypertension, type 2 DM, hyperlipidemia, obesity and tobacco abuse admitted on 03/22/2020 with acute hypoxic respiratory failure secondary to Covid Pneumonia -Patient is Not vaccinated against COVID-19 infection --Severe Hypoxia Persist and still requiring HFNC and NRB-   A/p 1)Acute hypoxic respiratory failure secondary to COVID-19 infection/Pneumonia--- The treatment plan and use of medications  for treatment of COVID-19 infection and possible side effects were discussed with patient/family -----Patient/Family verbalizes understanding and agrees to treatment protocols --Patient currently on 40 L of heated high flow 100% oxygen along with nonrebreather bag at 15 L --Patient is positive for COVID-19 infection, chest x-ray with findings of infiltrates/opacities,  patient is tachypneic/hypoxic and requiring continuous supplemental oxygen---patient meets criteria for initiation of Remdesivir AND Steroid therapy per protocol   --Check and trend inflammatory markers including D-dimer, ferritin and  CRP---also follow CBC and CMP --Supplemental oxygen to keep O2 sats above 93% -Follow serial chest x-rays and ABGs as indicated --- Encourage prone positioning for More than 16 hours/day in increments of 2 to 3 hours at a time if able to tolerate --Attempt to maintain euvolemic state --Zinc and vitamin C as ordered -Albuterol inhaler as needed -Accu-Cheks/fingersticks while on high-dose steroids -PPI while on high-dose steroids -Enhanced dosage of anticoagulant for DVT prophylaxis given hypercoagulable state with COVID-19 infections -Continue IV Steroids started on 03/22/2020 -She has completed a course of remdesivir -Given persistent severe hypoxia and inflammatory markers-baricitinib started on 03/21/2020  COVID-19 Labs  Recent Labs    03/25/20 0436  DDIMER 0.86*  FERRITIN 1,549*  CRP 4.3*    Lab Results  Component Value Date   SARSCOV2NAA POSITIVE (A) 03/06/2020   SARSCOV2NAA NEGATIVE 02/15/2019   Actemra/Baricitinib:- The patient has hypoxia and is high-risk for intubation, but expected to survive >48 hours and has good baseline functional status.  She is not known to be on immunomodulators, anti-rejection medications, or cancer chemotherapy, has no history of TB or latent TB, and no history of diverticulitis or intestinal perforation.  Platelets are >50K, ANC is >500, and ALT/AST are below 5x ULN with no known hepatitis B infection. Baricitinib is being used under EUA by the FDA. The patient has no ESRD or AKI, known history of TB, severe neutropenia (ANC <500) or lymphopenia, or severe  LFT elevations. They are not on DMARDs or probenecid, and are not pregnant. The option to use/refuse baricitinib treatment under FDA authorization (not approval), the significant known and potential risks and benefits, the extent to which these are unknown, and information regarding all available alternatives were discussed in  detail. Specifically the risk of VTE and secondary infections were discussed in detail with the patient and/or HCPOA. They consent to proceed with treatment. - Continue Baricitinib (started 03/21/20), c/n 14 days or until hospital discharge. Monitor Cr, LFTs, differential. Keep on VTE ppx  2) COPD/tobacco abuse--- nicotine patch as ordered, steroids and bronchodilators as above #1  3)HLD-stopped Lipitor for now, watch LFTs closely with baricitinib use  4)HTN--currently on losartan, may use as needed IV labetalol for elevated BP  5) leukopenia and thrombocytopenia--- resolved on CBC from 9/30  6) class II obesity- -after resolution of acute hypoxic respiratory failure and Covid infection patient will be advised to adhere to low calorie diet, portion control and increase physical activity discussed with patient -Body mass index is 35.48 kg/m.  7) hyperglycemia--- anticipate worsening with steroids, Use Novolog/Humalog Sliding scale insulin with Accu-Cheks/Fingersticks as ordered -Insulin regimen adjusted due to persistent hyperglycemia  8)Social/Ethics----- RN [name] at bedside--- we had extensive conversation with patient regarding goals of care and advanced directives -Patient repeatedly states myself and the RN at bedside that she wants to be a DNR/DNI----she understands that given her worsening respiratory status she may die- --I called and updated patient's son (Mr. Lizzie An at [phone] on patient's condition and patient's decision for DNR/DNI status  Disposition/Need for in-Hospital Stay- patient unable to be discharged at this time due to --severe hypoxic respiratory failure in the setting of COVID-19 pneumonia requiring I IV steroids and supplemental oxygen  Status is: Inpatient  Remains inpatient appropriate because:severe hypoxic respiratory failure in the setting of COVID-19 pneumonia requiring  IV steroids and supplemental oxygen   Disposition: The patient  is from: Home              Anticipated d/c is to: Home              Anticipated d/c date is: > 3 days              Patient currently is not medically stable to d/c. Barriers: Not Clinically Stable- severe hypoxic respiratory failure in the setting of COVID-19 pneumonia requiring  IV steroids and supplemental oxygen  Code Status : full  Family Communication:    (patient is alert, awake and coherent)  Discussed with pt's son Mr. Takyia Sindt at [phone] 0n 10/3  Consults  :  na  DVT Prophylaxis  :  Lovenox -  - SCDs    Lab Results  Component Value Date   PLT 186 03/25/2020    Inpatient Medications  Scheduled Meds: . albuterol  2 puff Inhalation Q6H  . amLODipine  10 mg Oral Daily  . vitamin C  500 mg Oral Daily  . baricitinib  4 mg Oral Daily  . Chlorhexidine Gluconate Cloth  6 each Topical Daily  . enoxaparin (LOVENOX) injection  50 mg Subcutaneous Q24H  . gabapentin  300 mg Oral TID  . guaiFENesin  600 mg Oral BID  . insulin aspart  0-20 Units Subcutaneous TID WC  . insulin aspart  0-5 Units Subcutaneous QHS  . [START ON 03/27/2020] insulin aspart  6 Units Subcutaneous TID WC  . insulin glargine  30 Units Subcutaneous BID  . mouth rinse  15  mL Mouth Rinse BID  . methylPREDNISolone (SOLU-MEDROL) injection  60 mg Intravenous Q12H  . nicotine  21 mg Transdermal Daily  . pantoprazole  40 mg Oral Daily  . sodium chloride flush  10-40 mL Intracatheter Q12H  . zinc sulfate  220 mg Oral Daily   Continuous Infusions:  PRN Meds:.acetaminophen, chlorpheniramine-HYDROcodone, guaiFENesin-dextromethorphan, labetalol, ondansetron **OR** ondansetron (ZOFRAN) IV, phenol, sodium chloride flush    Anti-infectives (From admission, onward)   Start     Dose/Rate Route Frequency Ordered Stop   03/21/20 1000  remdesivir 100 mg in sodium chloride 0.9 % 100 mL IVPB  Status:  Discontinued       "Followed by" Linked Group Details   100 mg 200 mL/hr over 30 Minutes Intravenous Daily  04/17/20 2013 04-17-2020 2020   03/21/20 1000  remdesivir 100 mg in sodium chloride 0.9 % 100 mL IVPB        100 mg 200 mL/hr over 30 Minutes Intravenous Daily 04-17-2020 2025 03/24/20 0857   2020/04/17 2030  remdesivir 100 mg in sodium chloride 0.9 % 100 mL IVPB  Status:  Discontinued       "And" Linked Group Details   100 mg 200 mL/hr over 30 Minutes Intravenous Daily April 17, 2020 2016 April 17, 2020 2019   2020/04/17 2030  remdesivir 100 mg in sodium chloride 0.9 % 100 mL IVPB  Status:  Discontinued       "And" Linked Group Details   100 mg 200 mL/hr over 30 Minutes Intravenous  Once 04/17/2020 2016 2020-04-17 2019   04/17/2020 2030  remdesivir 100 mg in sodium chloride 0.9 % 100 mL IVPB  Status:  Discontinued        100 mg 200 mL/hr over 30 Minutes Intravenous Daily April 17, 2020 2017 Apr 17, 2020 2019   04/17/2020 2030  remdesivir 100 mg in sodium chloride 0.9 % 100 mL IVPB  Status:  Discontinued        100 mg 200 mL/hr over 30 Minutes Intravenous  Once 04/17/20 2021 2020/04/17 2025   04-17-20 2030  remdesivir 100 mg in sodium chloride 0.9 % 100 mL IVPB  Status:  Discontinued       "And" Linked Group Details   100 mg 200 mL/hr over 30 Minutes Intravenous Daily 2020/04/17 2023 03/21/20 1116   04/17/2020 2030  remdesivir 100 mg in sodium chloride 0.9 % 100 mL IVPB       "And" Linked Group Details   100 mg 200 mL/hr over 30 Minutes Intravenous  Once 2020-04-17 2023 04/17/2020 2225   Apr 17, 2020 2013  remdesivir 200 mg in sodium chloride 0.9% 250 mL IVPB  Status:  Discontinued       "Followed by" Linked Group Details   200 mg 580 mL/hr over 30 Minutes Intravenous Once 04/17/2020 2013 04-17-20 2020        Objective:   Vitals:   03/26/20 1100 03/26/20 1200 03/26/20 1300 03/26/20 1400  BP: 126/78 132/79 132/70 123/79  Pulse: 77 77 87 81  Resp: 18 18 (!) 21 (!) 22  Temp:      TempSrc:      SpO2: (!) 85% (!) 87% 92% (!) 89%  Weight:      Height:        Wt Readings from Last 3 Encounters:  03/24/20 88 kg  09/14/19 94.1 kg   05/05/19 91.6 kg     Intake/Output Summary (Last 24 hours) at 03/26/2020 1837 Last data filed at 03/26/2020 0500 Gross per 24 hour  Intake --  Output 800 ml  Net -800 ml    Physical Exam General exam: Alert, awake, oriented x 3 Respiratory system: fine crackles bilaterally. Respiratory effort normal. Cardiovascular system:RRR. No murmurs, rubs, gallops. Gastrointestinal system: Abdomen is nondistended, soft and nontender. No organomegaly or masses felt. Normal bowel sounds heard. Central nervous system: Alert and oriented. No focal neurological deficits. Extremities: No C/C/E, +pedal pulses Skin: No rashes, lesions or ulcers Psychiatry: Judgement and insight appear normal. Mood & affect appropriate.      Data Review:   Micro Results Recent Results (from the past 240 hour(s))  Respiratory Panel by RT PCR (Flu A&B, Covid) - Nasopharyngeal Swab     Status: Abnormal   Collection Time: 02/24/2020  5:35 PM   Specimen: Nasopharyngeal Swab  Result Value Ref Range Status   SARS Coronavirus 2 by RT PCR POSITIVE (A) NEGATIVE Final    Comment: RESULT CALLED TO, READ BACK BY AND VERIFIED WITH: BETHEL,SANDRA @1844  ON 03/19/2020 BY JONES,T (NOTE) SARS-CoV-2 target nucleic acids are DETECTED.  SARS-CoV-2 RNA is generally detectable in upper respiratory specimens  during the acute phase of infection. Positive results are indicative of the presence of the identified virus, but do not rule out bacterial infection or co-infection with other pathogens not detected by the test. Clinical correlation with patient history and other diagnostic information is necessary to determine patient infection status. The expected result is Negative.  Fact Sheet for Patients:  03/22/20  Fact Sheet for Healthcare Providers: https://www.moore.com/  This test is not yet approved or cleared by the https://www.young.biz/ FDA and  has been authorized for detection  and/or diagnosis of SARS-CoV-2 by FDA under an Emergency Use Authorization (EUA).  This EUA will remain in effect (meaning this test  can be used) for the duration of  the COVID-19 declaration under Section 564(b)(1) of the Act, 21 U.S.C. section 360bbb-3(b)(1), unless the authorization is terminated or revoked sooner.      Influenza A by PCR NEGATIVE NEGATIVE Final   Influenza B by PCR NEGATIVE NEGATIVE Final    Comment: (NOTE) The Xpert Xpress SARS-CoV-2/FLU/RSV assay is intended as an aid in  the diagnosis of influenza from Nasopharyngeal swab specimens and  should not be used as a sole basis for treatment. Nasal washings and  aspirates are unacceptable for Xpert Xpress SARS-CoV-2/FLU/RSV  testing.  Fact Sheet for Patients: [place]  Fact Sheet for Healthcare Providers: https://www.moore.com/  This test is not yet approved or cleared by the https://www.young.biz/ FDA and  has been authorized for detection and/or diagnosis of SARS-CoV-2 by  FDA under an Emergency Use Authorization (EUA). This EUA will remain  in effect (meaning this test can be used) for the duration of the  Covid-19 declaration under Section 564(b)(1) of the Act, 21  U.S.C. section 360bbb-3(b)(1), unless the authorization is  terminated or revoked. Performed at [facility], [address], [place], [place]   Blood Culture (routine x 2)     Status: None   Collection Time: 02/24/2020  8:00 PM   Specimen: BLOOD LEFT ARM  Result Value Ref Range Status   Specimen Description BLOOD LEFT ARM  Final   Special Requests   Final    BOTTLES DRAWN AEROBIC AND ANAEROBIC Blood Culture adequate volume   Culture   Final    NO GROWTH 5 DAYS Performed at [facility], [address] [place], [place]    Report Status 03/25/2020 FINAL  Final  Blood Culture (routine x 2)     Status: None  Collection Time: 03/13/2020  8:11 PM   Specimen: BLOOD LEFT HAND  Result  Value Ref Range Status   Specimen Description BLOOD LEFT HAND  Final   Special Requests   Final    BOTTLES DRAWN AEROBIC AND ANAEROBIC Blood Culture adequate volume   Culture   Final    NO GROWTH 5 DAYS Performed at [facility], [address] [place], [place] [address]    Report Status 03/25/2020 FINAL  Final  MRSA PCR Screening     Status: None   Collection Time: 03/22/20 10:30 PM   Specimen: Nasal Mucosa; Nasopharyngeal  Result Value Ref Range Status   MRSA by PCR NEGATIVE NEGATIVE Final    Comment:        The GeneXpert MRSA Assay (FDA approved for NASAL specimens only), is one component of a comprehensive MRSA colonization surveillance program. It is not intended to diagnose MRSA infection nor to guide or monitor treatment for MRSA infections. Performed at [facility], [address], [place], [place] [address]     Radiology Reports DG Chest Portable 1 View  Result Date: 03/07/2020 CLINICAL DATA:  Weakness, shortness of breath and cough. EXAM: PORTABLE CHEST 1 VIEW COMPARISON:  March 09, 2018 FINDINGS: Moderate severity bilateral multifocal infiltrates are seen. There is no evidence of a pleural effusion or pneumothorax. The heart size and mediastinal contours are within normal limits. The visualized skeletal structures are unremarkable. IMPRESSION: Moderate severity bilateral multifocal infiltrates. Electronically Signed   By: Aram Candela M.D.   On: 03/18/2020 18:33   [place] EKG SITE RITE  Result Date: 03/23/2020 If Site Rite image not attached, placement could not be confirmed due to current cardiac rhythm.    CBC Recent Labs  Lab 03/11/2020 1711 03/21/20 0719 03/22/20 0732 03/25/20 0436  WBC 3.9* 3.7* 6.1 9.3  HGB 14.6 14.2 14.0 13.2  HCT 44.1 45.0 42.0 39.1  PLT 141* 128* 186 186  MCV 85.8 90.4 85.2 84.4  MCH 28.4 28.5 28.4 28.5  MCHC 33.1 31.6 33.3 33.8  RDW 13.2 13.3 13.3 13.1  LYMPHSABS 0.8 0.9 1.0 1.0  MONOABS 0.4 0.3 0.3 0.6  EOSABS 0.0 0.0 0.0  0.0  BASOSABS 0.0 0.0 0.0 0.0    Chemistries  Recent Labs  Lab 02/25/2020 1711 03/21/20 0719 03/22/20 0732 03/23/20 0617 03/25/20 0436  NA 132* 134* 137 139 137  K 4.0 4.3 3.9 4.4 4.1  CL 97* 102 103 107 103  CO2 20* 17* 23 21* 25  GLUCOSE 271* 346* 382* 321* 121*  BUN 20 24* 33* 30* 25*  CREATININE 0.99 0.95 0.94 0.83 0.66  CALCIUM 8.4* 8.5* 8.9 8.5* 8.3*  MG  --  1.9 2.0 2.0 1.8  AST 50* 52* 64* 57* 40  ALT ALKPHOS 70 67 106 130* 144*  BILITOT 0.7 0.7 0.3 0.4 0.3    No results for input(s): CHOL, HDL, LDLCALC, TRIG, CHOLHDL, LDLDIRECT in the last 72 hours.  Lab Results  Component Value Date   HGBA1C 10.3 (H) 03/04/2020   ------------------------------------------------------------------------------------------------------------------ No results for input(s): TSH, T4TOTAL, T3FREE, THYROIDAB in the last 72 hours.  Invalid input(s): FREET3 ------------------------------------------------------------------------------------------------------------------ Recent Labs    03/25/20 0436  FERRITIN 1,549*    Coagulation profile No results for input(s): INR, PROTIME in the last 168 hours.  Recent Labs    03/25/20 0436  DDIMER 0.86*    Cardiac Enzymes No results for input(s): CKMB, TROPONINI, MYOGLOBIN in the last 168 hours.  Invalid input(s):  CK ------------------------------------------------------------------------------------------------------------------ No results found for: BNP   Erick BlinksJehanzeb Jakaleb Payer M.D on 03/26/2020 at 6:37 PM  Go to [email or web address] - for contact info  [facility] - Office  [phone]

## 2020-03-26 NOTE — Progress Notes (Signed)
Inpatient Diabetes Program Recommendations  AACE/ADA: New Consensus Statement on Inpatient Glycemic Control (2015)  Target Ranges:  Prepandial:   less than 140 mg/dL      Peak postprandial:   less than 180 mg/dL (1-2 hours)      Critically ill patients:  140 - 180 mg/dL   Lab Results  Component Value Date   GLUCAP 240 (H) 03/26/2020   HGBA1C 10.3 (H) 03/08/2020    Review of Glycemic Control Results for [name], [name] (MRN [id number]) as of 03/26/2020 11:13  Ref. Range 03/25/2020 08:30 03/25/2020 11:14 03/25/2020 16:55 03/25/2020 21:50 03/26/2020 08:11  Glucose-Capillary Latest Ref Range: 70 - 99 mg/dL 034 (H) 742 (H) 595 (H) 246 (H) 240 (H)   Diabetes history: DM2 Outpatient Diabetes medications: Lantus 40 units QHS, Novolog 8 units TID with meals, Metformin 500 mg BID Current orders for Inpatient glycemic control: Lantus 16 units QHS, Novolog 4 units tid meal coverage, Novolog 0-20 units TID with meals, Novolog 0-5 units QHS; Solumedrol 60 mg Q12H  Inpatient Diabetes Program Recommendations:   -Increase Novolog 6 units TID with meals for meal coverage if patient eats at least 50% of meals.  Thank you, Billy Fischer. Shep Porter, RN, MSN, CDE  Diabetes Coordinator Inpatient Glycemic Control Team Team Pager [phone] (8am-5pm) 03/26/2020 11:15 AM

## 2020-03-27 ENCOUNTER — Inpatient Hospital Stay (HOSPITAL_COMMUNITY): Payer: Medicare Other

## 2020-03-27 DIAGNOSIS — E669 Obesity, unspecified: Secondary | ICD-10-CM | POA: Diagnosis not present

## 2020-03-27 DIAGNOSIS — D696 Thrombocytopenia, unspecified: Secondary | ICD-10-CM | POA: Diagnosis not present

## 2020-03-27 DIAGNOSIS — U071 COVID-19: Secondary | ICD-10-CM | POA: Diagnosis not present

## 2020-03-27 DIAGNOSIS — I10 Essential (primary) hypertension: Secondary | ICD-10-CM | POA: Diagnosis not present

## 2020-03-27 LAB — GLUCOSE, CAPILLARY
Glucose-Capillary: 105 mg/dL — ABNORMAL HIGH (ref 70–99)
Glucose-Capillary: 236 mg/dL — ABNORMAL HIGH (ref 70–99)
Glucose-Capillary: 357 mg/dL — ABNORMAL HIGH (ref 70–99)
Glucose-Capillary: 403 mg/dL — ABNORMAL HIGH (ref 70–99)

## 2020-03-27 LAB — COMPREHENSIVE METABOLIC PANEL
ALT: 34 U/L (ref 0–44)
AST: 58 U/L — ABNORMAL HIGH (ref 15–41)
Albumin: 2.6 g/dL — ABNORMAL LOW (ref 3.5–5.0)
Alkaline Phosphatase: 150 U/L — ABNORMAL HIGH (ref 38–126)
Anion gap: 10 (ref 5–15)
BUN: 30 mg/dL — ABNORMAL HIGH (ref 8–23)
CO2: 28 mmol/L (ref 22–32)
Calcium: 8.2 mg/dL — ABNORMAL LOW (ref 8.9–10.3)
Chloride: 96 mmol/L — ABNORMAL LOW (ref 98–111)
Creatinine, Ser: 0.79 mg/dL (ref 0.44–1.00)
GFR calc Af Amer: >60 mL/min (ref >60)
GFR calc non Af Amer: >60 mL/min (ref >60)
Glucose, Bld: 71 mg/dL (ref 70–99)
Potassium: 3.5 mmol/L (ref 3.5–5.1)
Sodium: 134 mmol/L — ABNORMAL LOW (ref 135–145)
Total Bilirubin: 0.7 mg/dL (ref 0.3–1.2)
Total Protein: 6.4 g/dL — ABNORMAL LOW (ref 6.5–8.1)

## 2020-03-27 LAB — D-DIMER, QUANTITATIVE: D-Dimer, Quant: 2.12 ug/mL-FEU — ABNORMAL HIGH (ref 0.00–0.50)

## 2020-03-27 LAB — FERRITIN: Ferritin: 1613 ng/mL — ABNORMAL HIGH (ref 11–307)

## 2020-03-27 LAB — C-REACTIVE PROTEIN: CRP: 9.6 mg/dL — ABNORMAL HIGH (ref <1.0)

## 2020-03-27 NOTE — Progress Notes (Signed)
Patient Demographics:    [name], is a 64 y.o. female, DOB - 08/15/1955, WUJ:[id number]  Admit date - 03/03/2020   Admitting Physician Frankey Shown, DO  Outpatient Primary MD for the patient is Pearson Grippe, MD  LOS - 7  No chief complaint on file.       Subjective:    [name] continues to feel short of breath overall.  Still requiring heated high flow and nonrebreather mask.  Cough is nonproductive.   Assessment  & Plan :    Principal Problem:   Pneumonia due to COVID-19 virus Active Problems:   Acute respiratory failure with hypoxia (HCC)   Severe sepsis (HCC)   Leukopenia   Thrombocytopenia (HCC)   Hyperglycemia due to diabetes mellitus (HCC)   Essential hypertension   Type 2 diabetes mellitus with hyperlipidemia (HCC)   Obesity (BMI 30-39.9)   Tobacco abuse   Lactic acidosis  Brief Summary:-  64 y.o. female with medical history significant for hypertension, type 2 DM, hyperlipidemia, obesity and tobacco abuse admitted on 03/07/2020 with acute hypoxic respiratory failure secondary to Covid Pneumonia -Patient is Not vaccinated against COVID-19 infection --Severe Hypoxia Persist and still requiring HFNC and NRB-   A/p 1)Acute hypoxic respiratory failure secondary to COVID-19 infection/Pneumonia--- The treatment plan and use of medications  for treatment of COVID-19 infection and possible side effects were discussed with patient/family -----Patient/Family verbalizes understanding and agrees to treatment protocols --Patient still requiring high flow oxygen at 40 L of heated high flow 100% oxygen along with nonrebreather mask.  At 15 L --Patient is positive for COVID-19 infection, chest x-ray with findings of infiltrates/opacities,  patient is tachypneic/hypoxic and requiring continuous supplemental oxygen---patient meets criteria for initiation of Remdesivir AND Steroid therapy per  protocol  --Check and trend inflammatory markers including D-dimer, ferritin and  CRP---also follow CBC and CMP --Supplemental oxygen to keep O2 sats above 93% -Follow serial chest x-rays and ABGs as indicated --- Encourage prone positioning for More than 16 hours/day in increments of 2 to 3 hours at a time if able to tolerate --Attempt to maintain euvolemic state --Zinc and vitamin C as ordered -Albuterol inhaler as needed -Accu-Cheks/fingersticks while on high-dose steroids -PPI while on high-dose steroids -Enhanced dosage of anticoagulant for DVT prophylaxis given hypercoagulable state with COVID-19 infections -Continue IV Steroids started on 03/09/2020 -She has completed a course of remdesivir -Given persistent severe hypoxia and inflammatory markers-baricitinib started on 03/21/2020  -repeat chest xray in AM  COVID-19 Labs  Recent Labs    03/25/20 0436 03/27/20 0533  DDIMER 0.86* 2.12*  FERRITIN 1,549* 1,613*  CRP 4.3* 9.6*    Lab Results  Component Value Date   SARSCOV2NAA POSITIVE (A) 03/15/2020   SARSCOV2NAA NEGATIVE 02/15/2019   Actemra/Baricitinib:- The patient has hypoxia and is high-risk for intubation, but expected to survive >48 hours and has good baseline functional status.  She is not known to be on immunomodulators, anti-rejection medications, or cancer chemotherapy, has no history of TB or latent TB, and no history of diverticulitis or intestinal perforation.  Platelets are >50K, ANC is >500, and ALT/AST are below 5x ULN with no known hepatitis B infection. Baricitinib is being used under EUA by the FDA. The patient has no ESRD or AKI,  known history of TB, severe neutropenia (ANC <500) or lymphopenia, or severe LFT elevations. They are not on DMARDs or probenecid, and are not pregnant. The option to use/refuse baricitinib treatment under FDA authorization (not approval), the significant known and potential risks and benefits, the extent to which these are unknown, and  information regarding all available alternatives were discussed in detail. Specifically the risk of VTE and secondary infections were discussed in detail with the patient and/or HCPOA. They consent to proceed with treatment. - Continue Baricitinib (started 03/21/20), c/n 14 days or until hospital discharge. Monitor Cr, LFTs, differential. Keep on VTE ppx  2) COPD/tobacco abuse--- nicotine patch as ordered, steroids and bronchodilators as above #1  3)HLD-stopped Lipitor for now, watch LFTs closely with baricitinib use  4)HTN--currently on losartan, may use as needed IV labetalol for elevated BP  5) leukopenia and thrombocytopenia--- resolved on CBC from 9/30  6) class II obesity- -after resolution of acute hypoxic respiratory failure and Covid infection patient will be advised to adhere to low calorie diet, portion control and increase physical activity discussed with patient -Body mass index is 35.48 kg/m.  7) hyperglycemia--- anticipate worsening with steroids, Use Novolog/Humalog Sliding scale insulin with Accu-Cheks/Fingersticks as ordered -Insulin regimen adjusted due to persistent hyperglycemia  8)Social/Ethics----- RN [name] at bedside--- we had extensive conversation with patient regarding goals of care and advanced directives -Patient repeatedly states myself and the RN at bedside that she wants to be a DNR/DNI----she understands that given her worsening respiratory status she may die- --I called and updated patient's son (Mr. Arie Gable at [phone] on patient's condition and patient's decision for DNR/DNI status  Disposition/Need for in-Hospital Stay- patient unable to be discharged at this time due to --severe hypoxic respiratory failure in the setting of COVID-19 pneumonia requiring I IV steroids and supplemental oxygen  Status is: Inpatient  Remains inpatient appropriate because:severe hypoxic respiratory failure in the setting of COVID-19 pneumonia requiring   IV steroids and supplemental oxygen   Disposition: The patient is from: Home              Anticipated d/c is to: Home              Anticipated d/c date is: > 3 days              Patient currently is not medically stable to d/c. Barriers: Not Clinically Stable- severe hypoxic respiratory failure in the setting of COVID-19 pneumonia requiring  IV steroids and supplemental oxygen  Code Status : full  Family Communication:    (patient is alert, awake and coherent)  Discussed with pt's son Mr. Grazia Taffe at [phone] 0n 10/3  Consults  :  na  DVT Prophylaxis  :  Lovenox -  - SCDs    Lab Results  Component Value Date   PLT 186 03/25/2020    Inpatient Medications  Scheduled Meds: . albuterol  2 puff Inhalation Q6H  . amLODipine  10 mg Oral Daily  . vitamin C  500 mg Oral Daily  . baricitinib  4 mg Oral Daily  . Chlorhexidine Gluconate Cloth  6 each Topical Daily  . enoxaparin (LOVENOX) injection  50 mg Subcutaneous Q24H  . gabapentin  300 mg Oral TID  . guaiFENesin  600 mg Oral BID  . insulin aspart  0-20 Units Subcutaneous TID WC  . insulin aspart  0-5 Units Subcutaneous QHS  . insulin aspart  6 Units Subcutaneous TID WC  . insulin glargine  30  Units Subcutaneous BID  . mouth rinse  15 mL Mouth Rinse BID  . methylPREDNISolone (SOLU-MEDROL) injection  60 mg Intravenous Q12H  . nicotine  21 mg Transdermal Daily  . pantoprazole  40 mg Oral Daily  . sodium chloride flush  10-40 mL Intracatheter Q12H  . zinc sulfate  220 mg Oral Daily   Continuous Infusions:  PRN Meds:.acetaminophen, chlorpheniramine-HYDROcodone, guaiFENesin-dextromethorphan, labetalol, ondansetron **OR** ondansetron (ZOFRAN) IV, phenol, sodium chloride flush    Anti-infectives (From admission, onward)   Start     Dose/Rate Route Frequency Ordered Stop   03/21/20 1000  remdesivir 100 mg in sodium chloride 0.9 % 100 mL IVPB  Status:  Discontinued       "Followed by" Linked Group Details   100  mg 200 mL/hr over 30 Minutes Intravenous Daily July 04, 2019 2013 July 04, 2019 2020   03/21/20 1000  remdesivir 100 mg in sodium chloride 0.9 % 100 mL IVPB        100 mg 200 mL/hr over 30 Minutes Intravenous Daily July 04, 2019 2025 03/24/20 0857   July 04, 2019 2030  remdesivir 100 mg in sodium chloride 0.9 % 100 mL IVPB  Status:  Discontinued       "And" Linked Group Details   100 mg 200 mL/hr over 30 Minutes Intravenous Daily July 04, 2019 2016 July 04, 2019 2019   July 04, 2019 2030  remdesivir 100 mg in sodium chloride 0.9 % 100 mL IVPB  Status:  Discontinued       "And" Linked Group Details   100 mg 200 mL/hr over 30 Minutes Intravenous  Once July 04, 2019 2016 July 04, 2019 2019   July 04, 2019 2030  remdesivir 100 mg in sodium chloride 0.9 % 100 mL IVPB  Status:  Discontinued        100 mg 200 mL/hr over 30 Minutes Intravenous Daily July 04, 2019 2017 July 04, 2019 2019   July 04, 2019 2030  remdesivir 100 mg in sodium chloride 0.9 % 100 mL IVPB  Status:  Discontinued        100 mg 200 mL/hr over 30 Minutes Intravenous  Once July 04, 2019 2021 July 04, 2019 2025   July 04, 2019 2030  remdesivir 100 mg in sodium chloride 0.9 % 100 mL IVPB  Status:  Discontinued       "And" Linked Group Details   100 mg 200 mL/hr over 30 Minutes Intravenous Daily July 04, 2019 2023 03/21/20 1116   July 04, 2019 2030  remdesivir 100 mg in sodium chloride 0.9 % 100 mL IVPB       "And" Linked Group Details   100 mg 200 mL/hr over 30 Minutes Intravenous  Once July 04, 2019 2023 July 04, 2019 2225   July 04, 2019 2013  remdesivir 200 mg in sodium chloride 0.9% 250 mL IVPB  Status:  Discontinued       "Followed by" Linked Group Details   200 mg 580 mL/hr over 30 Minutes Intravenous Once July 04, 2019 2013 July 04, 2019 2020        Objective:   Vitals:   03/27/20 1600 03/27/20 1626 03/27/20 1700 03/27/20 1800  BP: 130/71  126/75 119/72  Pulse: 82  70 77  Resp: 20  18 18   Temp: 98.6 F (37 C)     TempSrc: Axillary     SpO2: 100% 98% (!) 88% (!) 85%  Weight:      Height:        Wt Readings from Last  3 Encounters:  03/24/20 88 kg  09/14/19 94.1 kg  05/05/19 91.6 kg     Intake/Output Summary (Last 24 hours) at 03/27/2020 1950 Last data filed at 03/27/2020 1800 Gross per 24  hour  Intake 720 ml  Output 900 ml  Net -180 ml    Physical Exam General exam: Alert, awake, oriented x 3 Respiratory system: Clear to auscultation. Respiratory effort normal. Cardiovascular system:RRR. No murmurs, rubs, gallops. Gastrointestinal system: Abdomen is nondistended, soft and nontender. No organomegaly or masses felt. Normal bowel sounds heard. Central nervous system: Alert and oriented. No focal neurological deficits. Extremities: No C/C/E, +pedal pulses Skin: No rashes, lesions or ulcers Psychiatry: Judgement and insight appear normal. Mood & affect appropriate.      Data Review:   Micro Results Recent Results (from the past 240 hour(s))  Respiratory Panel by RT PCR (Flu A&B, Covid) - Nasopharyngeal Swab     Status: Abnormal   Collection Time: 03/15/2020  5:35 PM   Specimen: Nasopharyngeal Swab  Result Value Ref Range Status   SARS Coronavirus 2 by RT PCR POSITIVE (A) NEGATIVE Final    Comment: RESULT CALLED TO, READ BACK BY AND VERIFIED WITH: BETHEL,SANDRA @1844  ON 03/05/2020 BY JONES,T (NOTE) SARS-CoV-2 target nucleic acids are DETECTED.  SARS-CoV-2 RNA is generally detectable in upper respiratory specimens  during the acute phase of infection. Positive results are indicative of the presence of the identified virus, but do not rule out bacterial infection or co-infection with other pathogens not detected by the test. Clinical correlation with patient history and other diagnostic information is necessary to determine patient infection status. The expected result is Negative.  Fact Sheet for Patients:  03/22/20  Fact Sheet for Healthcare Providers: https://www.moore.com/  This test is not yet approved or cleared by the https://www.young.biz/  FDA and  has been authorized for detection and/or diagnosis of SARS-CoV-2 by FDA under an Emergency Use Authorization (EUA).  This EUA will remain in effect (meaning this test  can be used) for the duration of  the COVID-19 declaration under Section 564(b)(1) of the Act, 21 U.S.C. section 360bbb-3(b)(1), unless the authorization is terminated or revoked sooner.      Influenza A by PCR NEGATIVE NEGATIVE Final   Influenza B by PCR NEGATIVE NEGATIVE Final    Comment: (NOTE) The Xpert Xpress SARS-CoV-2/FLU/RSV assay is intended as an aid in  the diagnosis of influenza from Nasopharyngeal swab specimens and  should not be used as a sole basis for treatment. Nasal washings and  aspirates are unacceptable for Xpert Xpress SARS-CoV-2/FLU/RSV  testing.  Fact Sheet for Patients: [place]  Fact Sheet for Healthcare Providers: https://www.moore.com/  This test is not yet approved or cleared by the https://www.young.biz/ FDA and  has been authorized for detection and/or diagnosis of SARS-CoV-2 by  FDA under an Emergency Use Authorization (EUA). This EUA will remain  in effect (meaning this test can be used) for the duration of the  Covid-19 declaration under Section 564(b)(1) of the Act, 21  U.S.C. section 360bbb-3(b)(1), unless the authorization is  terminated or revoked. Performed at [facility], [address] [place], [place]   Blood Culture (routine x 2)     Status: None   Collection Time: 03/17/2020  8:00 PM   Specimen: BLOOD LEFT ARM  Result Value Ref Range Status   Specimen Description BLOOD LEFT ARM  Final   Special Requests   Final    BOTTLES DRAWN AEROBIC AND ANAEROBIC Blood Culture adequate volume   Culture   Final    NO GROWTH 5 DAYS Performed at [facility], [address] [place], [place]    Report Status 03/25/2020 FINAL  Final  Blood Culture (  routine x 2)     Status: None   Collection Time: 03/10/2020   8:11 PM   Specimen: BLOOD LEFT HAND  Result Value Ref Range Status   Specimen Description BLOOD LEFT HAND  Final   Special Requests   Final    BOTTLES DRAWN AEROBIC AND ANAEROBIC Blood Culture adequate volume   Culture   Final    NO GROWTH 5 DAYS Performed at [facility], [address], [place], [place] [address]    Report Status 03/25/2020 FINAL  Final  MRSA PCR Screening     Status: None   Collection Time: 03/22/20 10:30 PM   Specimen: Nasal Mucosa; Nasopharyngeal  Result Value Ref Range Status   MRSA by PCR NEGATIVE NEGATIVE Final    Comment:        The GeneXpert MRSA Assay (FDA approved for NASAL specimens only), is one component of a comprehensive MRSA colonization surveillance program. It is not intended to diagnose MRSA infection nor to guide or monitor treatment for MRSA infections. Performed at [facility], [address], [place], [place] [address]     Radiology Reports DG Chest Portable 1 View  Result Date: 03/03/2020 CLINICAL DATA:  Weakness, shortness of breath and cough. EXAM: PORTABLE CHEST 1 VIEW COMPARISON:  March 09, 2018 FINDINGS: Moderate severity bilateral multifocal infiltrates are seen. There is no evidence of a pleural effusion or pneumothorax. The heart size and mediastinal contours are within normal limits. The visualized skeletal structures are unremarkable. IMPRESSION: Moderate severity bilateral multifocal infiltrates. Electronically Signed   By: Aram Candela M.D.   On: 02/27/2020 18:33   [place] EKG SITE RITE  Result Date: 03/23/2020 If Site Rite image not attached, placement could not be confirmed due to current cardiac rhythm.    CBC Recent Labs  Lab 03/21/20 0719 03/22/20 0732 03/25/20 0436  WBC 3.7* 6.1 9.3  HGB 14.2 14.0 13.2  HCT 45.0 42.0 39.1  PLT 128* 186 186  MCV 90.4 85.2 84.4  MCH 28.5 28.4 28.5  MCHC 31.6 33.3 33.8  RDW 13.3 13.3 13.1  LYMPHSABS 0.9 1.0 1.0  MONOABS 0.3 0.3 0.6  EOSABS 0.0 0.0 0.0  BASOSABS 0.0  0.0 0.0    Chemistries  Recent Labs  Lab 03/21/20 0719 03/22/20 0732 03/23/20 0617 03/25/20 0436 03/27/20 0533  NA 134* 137 139 137 134*  K 4.3 3.9 4.4 4.1 3.5  CL 102 103 107 103 96*  CO2 17* 23 21* 25 28  GLUCOSE 346* 382* 321* 121* 71  BUN 24* 33* 30* 25* 30*  CREATININE 0.95 0.94 0.83 0.66 0.79  CALCIUM 8.5* 8.9 8.5* 8.3* 8.2*  MG 1.9 2.0 2.0 1.8  --   AST 52* 64* 57* 40 58*  ALT 27 30 26 24  34  ALKPHOS 67 106 130* 144* 150*  BILITOT 0.7 0.3 0.4 0.3 0.7    No results for input(s): CHOL, HDL, LDLCALC, TRIG, CHOLHDL, LDLDIRECT in the last 72 hours.  Lab Results  Component Value Date   HGBA1C 10.3 (H) 03/21/2020   ------------------------------------------------------------------------------------------------------------------ No results for input(s): TSH, T4TOTAL, T3FREE, THYROIDAB in the last 72 hours.  Invalid input(s): FREET3 ------------------------------------------------------------------------------------------------------------------ Recent Labs    03/25/20 0436 03/27/20 0533  FERRITIN 1,549* 1,613*    Coagulation profile No results for input(s): INR, PROTIME in the last 168 hours.  Recent Labs    03/25/20 0436 03/27/20 0533  DDIMER 0.86* 2.12*    Cardiac Enzymes No results for input(s): CKMB, TROPONINI, MYOGLOBIN in the last 168 hours.  Invalid input(s): CK ------------------------------------------------------------------------------------------------------------------ No results found for: BNP   Erick Blinks M.D on 03/27/2020 at 7:50 PM  Go to [email or web address] - for contact info  [facility] - Office  [phone]

## 2020-03-27 NOTE — Progress Notes (Signed)
Inpatient Diabetes Program Recommendations  AACE/ADA: New Consensus Statement on Inpatient Glycemic Control (2015)  Target Ranges:  Prepandial:   less than 140 mg/dL      Peak postprandial:   less than 180 mg/dL (1-2 hours)      Critically ill patients:  140 - 180 mg/dL   Lab Results  Component Value Date   GLUCAP 105 (H) 03/27/2020   HGBA1C 10.3 (H) 02/23/2020    Review of Glycemic Control Results for Tina, [name] (MRN [id number]) as of 03/27/2020 10:01  Ref. Range 03/26/2020 08:11 03/26/2020 12:03 03/26/2020 16:58 03/26/2020 20:48 03/27/2020 08:05  Glucose-Capillary Latest Ref Range: 70 - 99 mg/dL 832 (H) 919 (H) 166 (H) 197 (H) 105 (H)   Diabetes history:DM2 Outpatient Diabetes medications:Lantus 40 units QHS, Novolog 8 units TID with meals, Metformin 500 mg BID Current orders for Inpatient glycemic control:Lantus 30 units bid, Novolog 6 units tid meal coverage, Novolog 0-20 units TID with meals, Novolog 0-5 units QHS; Solumedrol 60 mg Q12H  Inpatient Diabetes Program Recommendations:   Blood glucose was 71 @ 0530. Please consider: -Decrease Lantus to 25 units bid -Decrease Novolog correction to moderate 0-15 tid + hs 0-5 units Secure chat sent to Dr. Kerry Hough.  Thank you, [name]. Tina Sima, RN, MSN, CDE  Diabetes Coordinator Inpatient Glycemic Control Team Team Pager [phone] (8am-5pm) 03/27/2020 10:03 AM

## 2020-03-28 DIAGNOSIS — U071 COVID-19: Secondary | ICD-10-CM | POA: Diagnosis not present

## 2020-03-28 DIAGNOSIS — J1282 Pneumonia due to Coronavirus disease 2019: Secondary | ICD-10-CM | POA: Diagnosis not present

## 2020-03-28 LAB — GLUCOSE, CAPILLARY
Glucose-Capillary: 165 mg/dL — ABNORMAL HIGH (ref 70–99)
Glucose-Capillary: 322 mg/dL — ABNORMAL HIGH (ref 70–99)
Glucose-Capillary: 277 mg/dL — ABNORMAL HIGH (ref 70–99)
Glucose-Capillary: 281 mg/dL — ABNORMAL HIGH (ref 70–99)

## 2020-03-28 LAB — COMPREHENSIVE METABOLIC PANEL
ALT: 36 U/L (ref 0–44)
AST: 41 U/L (ref 15–41)
Albumin: 2.5 g/dL — ABNORMAL LOW (ref 3.5–5.0)
Alkaline Phosphatase: 168 U/L — ABNORMAL HIGH (ref 38–126)
Anion gap: 10 (ref 5–15)
BUN: 34 mg/dL — ABNORMAL HIGH (ref 8–23)
CO2: 28 mmol/L (ref 22–32)
Calcium: 8.3 mg/dL — ABNORMAL LOW (ref 8.9–10.3)
Chloride: 96 mmol/L — ABNORMAL LOW (ref 98–111)
Creatinine, Ser: 0.85 mg/dL (ref 0.44–1.00)
GFR calc non Af Amer: >60 mL/min (ref >60)
Glucose, Bld: 162 mg/dL — ABNORMAL HIGH (ref 70–99)
Potassium: 4 mmol/L (ref 3.5–5.1)
Sodium: 134 mmol/L — ABNORMAL LOW (ref 135–145)
Total Bilirubin: 0.7 mg/dL (ref 0.3–1.2)
Total Protein: 6.9 g/dL (ref 6.5–8.1)

## 2020-03-28 LAB — FERRITIN: Ferritin: 2066 ng/mL — ABNORMAL HIGH (ref 11–307)

## 2020-03-28 LAB — C-REACTIVE PROTEIN: CRP: 16.9 mg/dL — ABNORMAL HIGH (ref <1.0)

## 2020-03-28 LAB — D-DIMER, QUANTITATIVE: D-Dimer, Quant: 2.46 ug/mL-FEU — ABNORMAL HIGH (ref 0.00–0.50)

## 2020-03-28 MED ORDER — SENNOSIDES-DOCUSATE SODIUM 8.6-50 MG PO TABS
2.0000 | ORAL_TABLET | Freq: Every day | ORAL | Status: DC
  Administered 2020-03-28 – 2020-03-31 (×3): 2 via ORAL
  Filled 2020-03-28 (×5): qty 2

## 2020-03-28 MED ORDER — POLYETHYLENE GLYCOL 3350 17 G PO PACK
17.0000 g | PACK | Freq: Every day | ORAL | Status: DC
  Administered 2020-03-28 – 2020-04-01 (×5): 17 g via ORAL
  Filled 2020-03-28 (×6): qty 1

## 2020-03-28 MED ORDER — HYDROCOD POLST-CPM POLST ER 10-8 MG/5ML PO SUER
5.0000 mL | Freq: Once | ORAL | Status: AC
  Administered 2020-03-28: 5 mL via ORAL
  Filled 2020-03-28: qty 5

## 2020-03-28 MED ORDER — MELATONIN 3 MG PO TABS
6.0000 mg | ORAL_TABLET | Freq: Every evening | ORAL | Status: DC | PRN
  Administered 2020-03-28 – 2020-04-01 (×5): 6 mg via ORAL
  Filled 2020-03-28 (×6): qty 2

## 2020-03-28 MED ORDER — ENSURE ENLIVE PO LIQD
237.0000 mL | Freq: Two times a day (BID) | ORAL | Status: DC
  Administered 2020-03-28 – 2020-03-29 (×2): 237 mL via ORAL

## 2020-03-28 MED ORDER — HYDROCOD POLST-CPM POLST ER 10-8 MG/5ML PO SUER
5.0000 mL | Freq: Three times a day (TID) | ORAL | Status: DC | PRN
  Administered 2020-03-28 – 2020-04-03 (×11): 5 mL via ORAL
  Filled 2020-03-28 (×11): qty 5

## 2020-03-28 MED ORDER — ALBUTEROL SULFATE HFA 108 (90 BASE) MCG/ACT IN AERS
2.0000 | INHALATION_SPRAY | Freq: Three times a day (TID) | RESPIRATORY_TRACT | Status: DC
  Administered 2020-03-29 – 2020-04-03 (×15): 2 via RESPIRATORY_TRACT

## 2020-03-28 MED ORDER — ADULT MULTIVITAMIN W/MINERALS CH
1.0000 | ORAL_TABLET | Freq: Every day | ORAL | Status: DC
  Administered 2020-03-28 – 2020-04-01 (×5): 1 via ORAL
  Filled 2020-03-28 (×6): qty 1

## 2020-03-28 NOTE — Progress Notes (Signed)
Patient Demographics:    [name], is a 64 y.o. female, DOB - Aug 09, 1955, UXN:[id number]  Admit date - 03/19/2020   Admitting Physician Tina Shown, DO  Outpatient Primary MD for the patient is Tina Grippe, MD  LOS - 8  No chief complaint on file.       Subjective:    [name] continues to feel short of breath overall.  Still requiring heated high flow AND nonrebreather mask.  -Patient with significant desaturations with coughing spells -Not doing well with attempt to wean down oxygen   Assessment  & Plan :    Principal Problem:   Pneumonia due to COVID-19 virus Active Problems:   Acute respiratory failure with hypoxia (HCC)   Severe sepsis (HCC)   Leukopenia   Thrombocytopenia (HCC)   Hyperglycemia due to diabetes mellitus (HCC)   Essential hypertension   Type 2 diabetes mellitus with hyperlipidemia (HCC)   Obesity (BMI 30-39.9)   Tobacco abuse   Lactic acidosis  Brief Summary:-  64 y.o. female with medical history significant for hypertension, type 2 DM, hyperlipidemia, obesity and tobacco abuse admitted on 03/16/2020 with acute hypoxic respiratory failure secondary to Covid Pneumonia -Patient is Not vaccinated against COVID-19 infection --Severe Hypoxia Persist and still requiring HFNC AND NRB-   A/p 1)Acute hypoxic respiratory failure secondary to COVID-19 infection/Pneumonia--- The treatment plan and use of medications  for treatment of COVID-19 infection and possible side effects were discussed with patient/family -----Patient/Family verbalizes understanding and agrees to treatment protocols --Patient still requiring high flow oxygen at 40 L of heated high flow 100% oxygen along with nonrebreather mask.   -Patient with significant desaturations with coughing spells -Not doing well with attempt to wean down oxygen --Supplemental oxygen to keep O2 sats above 93% -Follow  serial chest x-rays and ABGs as indicated --- Encourage prone positioning for More than 16 hours/day in increments of 2 to 3 hours at a time if able to tolerate --Attempt to maintain euvolemic state --Zinc and vitamin C as ordered -Albuterol inhaler as needed -Accu-Cheks/fingersticks while on high-dose steroids -PPI while on high-dose steroids -Enhanced dosage of anticoagulant for DVT prophylaxis given hypercoagulable state with COVID-19 infections -Continue IV Steroids started on 03/15/2020 -She has completed a course of remdesivir -Given persistent severe hypoxia and inflammatory markers-baricitinib started on 03/21/2020  -Repeat chest x-ray from 03/27/2020 consistent with Covid pneumonia which appears to have improved radiologically  COVID-19 Labs  Recent Labs    03/27/20 0533 03/28/20 0420  DDIMER 2.12* 2.46*  FERRITIN 1,613* 2,066*  CRP 9.6* 16.9*    Lab Results  Component Value Date   SARSCOV2NAA POSITIVE (A) 02/26/2020   SARSCOV2NAA NEGATIVE 02/15/2019   Actemra/Baricitinib:- The patient has hypoxia and is high-risk for intubation, but expected to survive >48 hours and has good baseline functional status.  She is not known to be on immunomodulators, anti-rejection medications, or cancer chemotherapy, has no history of TB or latent TB, and no history of diverticulitis or intestinal perforation.  Platelets are >50K, ANC is >500, and ALT/AST are below 5x ULN with no known hepatitis B infection. Baricitinib is being used under EUA by the FDA. The patient has no ESRD or AKI, known history of TB, severe neutropenia (ANC <500) or lymphopenia, or  severe LFT elevations. They are not on DMARDs or probenecid, and are not pregnant. The option to use/refuse baricitinib treatment under FDA authorization (not approval), the significant known and potential risks and benefits, the extent to which these are unknown, and information regarding all available alternatives were discussed in detail.  Specifically the risk of VTE and secondary infections were discussed in detail with the patient and/or HCPOA. They consent to proceed with treatment. - Continue Baricitinib (started 03/21/20), c/n 14 days or until hospital discharge. Monitor Cr, LFTs, differential. Keep on VTE ppx  2) COPD/tobacco abuse--- nicotine patch as ordered, steroids and bronchodilators as above #1  3)HLD-stopped Lipitor for now, watch LFTs closely with baricitinib use  4)HTN--currently on losartan, may use as needed IV labetalol for elevated BP  5)Leukopenia and Thrombocytopenia--- resolved on CBC from 03/22/20  6)Class II Obesity-- this increases overall morbidity and mortality -after resolution of acute hypoxic respiratory failure and Covid infection patient will be advised to adhere to low calorie diet, portion control and increase physical activity discussed with patient -Body mass index is 36.53 kg/m.  7) hyperglycemia--- anticipate worsening with steroids, Use Novolog/Humalog Sliding scale insulin with Accu-Cheks/Fingersticks as ordered -Insulin regimen adjusted due to persistent hyperglycemia  8)Social/Ethics----- RN Ms Welton FlakesHala [name] at bedside--- we had extensive conversation with patient regarding goals of care and advanced directives -Patient repeatedly states myself and the RN at bedside that she wants to be a DNR/DNI----she understands that given her worsening respiratory status she may die- --I called and updated patient's son ([name] at [phone] on patient's condition and patient's decision for DNR/DNI status  Disposition/Need for in-Hospital Stay- patient unable to be discharged at this time due to --severe hypoxic respiratory failure in the setting of COVID-19 pneumonia requiring IV steroids and supplemental oxygen  Status is: Inpatient  Remains inpatient appropriate because:severe hypoxic respiratory failure in the setting of COVID-19 pneumonia requiring  IV steroids and  supplemental oxygen  Disposition: The patient is from: Home              Anticipated d/c is to: Home              Anticipated d/c date is: > 3 days              Patient currently is not medically stable to d/c. Barriers: Not Clinically Stable- severe hypoxic respiratory failure in the setting of COVID-19 pneumonia requiring  IV steroids and supplemental oxygen  Code Status :  DNR  Family Communication:    (patient is alert, awake and coherent)  Discussed with pt's son Mr. Tina BathDennis Covey at [phone] 0n 03/28/20  Consults  :  na  DVT Prophylaxis  :  Lovenox -  - SCDs    Lab Results  Component Value Date   PLT 186 03/25/2020    Inpatient Medications  Scheduled Meds: . albuterol  2 puff Inhalation Q6H  . amLODipine  10 mg Oral Daily  . vitamin C  500 mg Oral Daily  . baricitinib  4 mg Oral Daily  . Chlorhexidine Gluconate Cloth  6 each Topical Daily  . chlorpheniramine-HYDROcodone  5 mL Oral Once  . enoxaparin (LOVENOX) injection  50 mg Subcutaneous Q24H  . gabapentin  300 mg Oral TID  . guaiFENesin  600 mg Oral BID  . insulin aspart  0-20 Units Subcutaneous TID WC  . insulin aspart  0-5 Units Subcutaneous QHS  . insulin aspart  6 Units Subcutaneous TID WC  . insulin glargine  30  Units Subcutaneous BID  . mouth rinse  15 mL Mouth Rinse BID  . methylPREDNISolone (SOLU-MEDROL) injection  60 mg Intravenous Q12H  . nicotine  21 mg Transdermal Daily  . pantoprazole  40 mg Oral Daily  . sodium chloride flush  10-40 mL Intracatheter Q12H  . zinc sulfate  220 mg Oral Daily   Continuous Infusions:  PRN Meds:.acetaminophen, chlorpheniramine-HYDROcodone, guaiFENesin-dextromethorphan, labetalol, melatonin, ondansetron **OR** ondansetron (ZOFRAN) IV, phenol, sodium chloride flush    Anti-infectives (From admission, onward)   Start     Dose/Rate Route Frequency Ordered Stop   03/21/20 1000  remdesivir 100 mg in sodium chloride 0.9 % 100 mL IVPB  Status:  Discontinued        "Followed by" Linked Group Details   100 mg 200 mL/hr over 30 Minutes Intravenous Daily 04/13/20 2013 2020/04/13 2020   03/21/20 1000  remdesivir 100 mg in sodium chloride 0.9 % 100 mL IVPB        100 mg 200 mL/hr over 30 Minutes Intravenous Daily 13-Apr-2020 2025 03/24/20 0857   April 13, 2020 2030  remdesivir 100 mg in sodium chloride 0.9 % 100 mL IVPB  Status:  Discontinued       "And" Linked Group Details   100 mg 200 mL/hr over 30 Minutes Intravenous Daily 04-13-20 2016 Apr 13, 2020 2019   04-13-2020 2030  remdesivir 100 mg in sodium chloride 0.9 % 100 mL IVPB  Status:  Discontinued       "And" Linked Group Details   100 mg 200 mL/hr over 30 Minutes Intravenous  Once 04-13-20 2016 2020-04-13 2019   April 13, 2020 2030  remdesivir 100 mg in sodium chloride 0.9 % 100 mL IVPB  Status:  Discontinued        100 mg 200 mL/hr over 30 Minutes Intravenous Daily Apr 13, 2020 2017 04-13-2020 2019   Apr 13, 2020 2030  remdesivir 100 mg in sodium chloride 0.9 % 100 mL IVPB  Status:  Discontinued        100 mg 200 mL/hr over 30 Minutes Intravenous  Once 04-13-20 2021 04/13/2020 2025   04-13-20 2030  remdesivir 100 mg in sodium chloride 0.9 % 100 mL IVPB  Status:  Discontinued       "And" Linked Group Details   100 mg 200 mL/hr over 30 Minutes Intravenous Daily 04/13/2020 2023 03/21/20 1116   2020/04/13 2030  remdesivir 100 mg in sodium chloride 0.9 % 100 mL IVPB       "And" Linked Group Details   100 mg 200 mL/hr over 30 Minutes Intravenous  Once 04/13/2020 2023 04/13/2020 2225   April 13, 2020 2013  remdesivir 200 mg in sodium chloride 0.9% 250 mL IVPB  Status:  Discontinued       "Followed by" Linked Group Details   200 mg 580 mL/hr over 30 Minutes Intravenous Once 04/13/20 2013 04/13/20 2020        Objective:   Vitals:   03/28/20 0700 03/28/20 1000 03/28/20 1100 03/28/20 1138  BP: (!) 142/74 135/79 (!) 145/80   Pulse: 74 79 87 72  Resp: (!) 21 19 (!) 25 20  Temp:      TempSrc:      SpO2: (!) 87%   95%  Weight:      Height:         Wt Readings from Last 3 Encounters:  03/28/20 90.6 kg  09/14/19 94.1 kg  05/05/19 91.6 kg     Intake/Output Summary (Last 24 hours) at 03/28/2020 1139 Last data filed at 03/28/2020 0500 Gross per  24 hour  Intake 480 ml  Output 1350 ml  Net -870 ml    Physical Exam General exam: Alert, awake, oriented x 3 Nose- NRB + HFNC  Respiratory system: Diminished breath sounds, occasional wheeze especially with coughing spells  cardiovascular system:RRR. No murmurs, rubs, gallops. Gastrointestinal system: Abdomen is soft, NT, ND, +BS   Central nervous system: Alert and oriented. No focal neurological deficits. Extremities: No C/C/E, +pedal pulses Skin: No rashes, lesions or ulcers Psychiatry: Judgement and insight appear normal. Mood & affect appropriate.     Data Review:   Micro Results Recent Results (from the past 240 hour(s))  Respiratory Panel by RT PCR (Flu A&B, Covid) - Nasopharyngeal Swab     Status: Abnormal   Collection Time: 03/17/2020  5:35 PM   Specimen: Nasopharyngeal Swab  Result Value Ref Range Status   SARS Coronavirus 2 by RT PCR POSITIVE (A) NEGATIVE Final    Comment: RESULT CALLED TO, READ BACK BY AND VERIFIED WITH: BETHEL,SANDRA  ON 03/09/2020 BY JONES,T (NOTE) SARS-CoV-2 target nucleic acids are DETECTED.  SARS-CoV-2 RNA is generally detectable in upper respiratory specimens  during the acute phase of infection. Positive results are indicative of the presence of the identified virus, but do not rule out bacterial infection or co-infection with other pathogens not detected by the test. Clinical correlation with patient history and other diagnostic information is necessary to determine patient infection status. The expected result is Negative.  Fact Sheet for Patients:  https://www.moore.com/  Fact Sheet for Healthcare Providers: https://www.young.biz/  This test is not yet approved or cleared by the [place] FDA and  has been authorized for detection and/or diagnosis of SARS-CoV-2 by FDA under an Emergency Use Authorization (EUA).  This EUA will remain in effect (meaning this test  can be used) for the duration of  the COVID-19 declaration under Section 564(b)(1) of the Act, 21 U.S.C. section 360bbb-3(b)(1), unless the authorization is terminated or revoked sooner.      Influenza A by PCR NEGATIVE NEGATIVE Final   Influenza B by PCR NEGATIVE NEGATIVE Final    Comment: (NOTE) The Xpert Xpress SARS-CoV-2/FLU/RSV assay is intended as an aid in  the diagnosis of influenza from Nasopharyngeal swab specimens and  should not be used as a sole basis for treatment. Nasal washings and  aspirates are unacceptable for Xpert Xpress SARS-CoV-2/FLU/RSV  testing.  Fact Sheet for Patients: https://www.moore.com/  Fact Sheet for Healthcare Providers: https://www.young.biz/  This test is not yet approved or cleared by the [place] FDA and  has been authorized for detection and/or diagnosis of SARS-CoV-2 by  FDA under an Emergency Use Authorization (EUA). This EUA will remain  in effect (meaning this test can be used) for the duration of the  Covid-19 declaration under Section 564(b)(1) of the Act, 21  U.S.C. section 360bbb-3(b)(1), unless the authorization is  terminated or revoked. Performed at [facility], [address] [place], [place] [address]   Blood Culture (routine x 2)     Status: None   Collection Time: 03/03/2020  8:00 PM   Specimen: BLOOD LEFT ARM  Result Value Ref Range Status   Specimen Description BLOOD LEFT ARM  Final   Special Requests   Final    BOTTLES DRAWN AEROBIC AND ANAEROBIC Blood Culture adequate volume   Culture   Final    NO GROWTH 5 DAYS Performed at [facility], [address] [place], [place] [address]    Report Status 03/25/2020 FINAL  Final  Blood Culture (routine x 2)     Status: None   Collection Time:  03/04/2020  8:11 PM   Specimen: BLOOD LEFT HAND  Result Value Ref Range Status   Specimen Description BLOOD LEFT HAND  Final   Special Requests   Final    BOTTLES DRAWN AEROBIC AND ANAEROBIC Blood Culture adequate volume   Culture   Final    NO GROWTH 5 DAYS Performed at [facility], [address], [place], [place] [address]    Report Status 03/25/2020 FINAL  Final  MRSA PCR Screening     Status: None   Collection Time: 03/22/20 10:30 PM   Specimen: Nasal Mucosa; Nasopharyngeal  Result Value Ref Range Status   MRSA by PCR NEGATIVE NEGATIVE Final    Comment:        The GeneXpert MRSA Assay (FDA approved for NASAL specimens only), is one component of a comprehensive MRSA colonization surveillance program. It is not intended to diagnose MRSA infection nor to guide or monitor treatment for MRSA infections. Performed at [facility], [address] [place], [place] [address]     Radiology Reports DG CHEST PORT 1 VIEW  Result Date: 03/27/2020 CLINICAL DATA:  Acute respiratory failure, COVID-19 positivity EXAM: PORTABLE CHEST 1 VIEW COMPARISON:  02/24/2020 FINDINGS: Cardiac shadow is stable. Mild airspace opacities are noted bilaterally consistent with the given clinical history but improved when compared with the prior exam. No sizable effusion is seen. No bony abnormality is noted. IMPRESSION: Improved bilateral infiltrates consistent with the given clinical history. Electronically Signed   By: Alcide Clever M.D.   On: 03/27/2020 20:53   DG Chest Portable 1 View  Result Date: 03/14/2020 CLINICAL DATA:  Weakness, shortness of breath and cough. EXAM: PORTABLE CHEST 1 VIEW COMPARISON:  March 09, 2018 FINDINGS: Moderate severity bilateral multifocal infiltrates are seen. There is no evidence of a pleural effusion or pneumothorax. The heart size and mediastinal contours are within normal limits. The visualized skeletal structures are unremarkable. IMPRESSION: Moderate severity bilateral  multifocal infiltrates. Electronically Signed   By: Aram Candela M.D.   On: 03/19/2020 18:33   [place] EKG SITE RITE  Result Date: 03/23/2020 If Site Rite image not attached, placement could not be confirmed due to current cardiac rhythm.    CBC Recent Labs  Lab 03/22/20 0732 03/25/20 0436  WBC 6.1 9.3  HGB 14.0 13.2  HCT 42.0 39.1  PLT 186 186  MCV 85.2 84.4  MCH 28.4 28.5  MCHC 33.3 33.8  RDW 13.3 13.1  LYMPHSABS 1.0 1.0  MONOABS 0.3 0.6  EOSABS 0.0 0.0  BASOSABS 0.0 0.0    Chemistries  Recent Labs  Lab 03/22/20 0732 03/23/20 0617 03/25/20 0436 03/27/20 0533 03/28/20 0420  NA 137 139 137 134* 134*  K 3.9 4.4 4.1 3.5 4.0  CL 103 107 103 96* 96*  CO2 23 21* 25 28 28   GLUCOSE 382* 321* 121* 71 162*  BUN 33* 30* 25* 30* 34*  CREATININE 0.94 0.83 0.66 0.79 0.85  CALCIUM 8.9 8.5* 8.3* 8.2* 8.3*  MG 2.0 2.0 1.8  --   --   AST 64* 57* 40 58* 41  ALT 30 26 24  34 36  ALKPHOS 106 130* 144* 150* 168*  BILITOT 0.3 0.4 0.3 0.7 0.7    No results for input(s): CHOL, HDL, LDLCALC, TRIG, CHOLHDL, LDLDIRECT in the last 72 hours.  Lab Results  Component Value Date   HGBA1C 10.3 (H) 03/17/2020   ------------------------------------------------------------------------------------------------------------------ No results  for input(s): TSH, T4TOTAL, T3FREE, THYROIDAB in the last 72 hours.  Invalid input(s): FREET3 ------------------------------------------------------------------------------------------------------------------ Recent Labs    03/27/20 0533 03/28/20 0420  FERRITIN 1,613* 2,066*    Coagulation profile No results for input(s): INR, PROTIME in the last 168 hours.  Recent Labs    03/27/20 0533 03/28/20 0420  DDIMER 2.12* 2.46*    Cardiac Enzymes No results for input(s): CKMB, TROPONINI, MYOGLOBIN in the last 168 hours.  Invalid input(s):  CK ------------------------------------------------------------------------------------------------------------------ No results found for: BNP   Shon Hale M.D on 03/28/2020 at 11:39 AM  Go to [email or web address] - for contact info  [facility] - Office  [phone]

## 2020-03-28 NOTE — Progress Notes (Signed)
Inpatient Diabetes Program Recommendations  AACE/ADA: New Consensus Statement on Inpatient Glycemic Control (2015)  Target Ranges:  Prepandial:   less than 140 mg/dL      Peak postprandial:   less than 180 mg/dL (1-2 hours)      Critically ill patients:  140 - 180 mg/dL   Lab Results  Component Value Date   GLUCAP 165 (H) 03/28/2020   HGBA1C 10.3 (H) 02/27/2020    Review of Glycemic Control Results for [name], [name] (MRN [id number]) as of 03/28/2020 09:42  Ref. Range 03/27/2020 08:05 03/27/2020 11:46 03/27/2020 17:40 03/27/2020 21:26 03/28/2020 08:48  Glucose-Capillary Latest Ref Range: 70 - 99 mg/dL 353 (H) 299 (H) 242 (H) 403 (H) 165 (H)   Diabetes history:DM2 Outpatient Diabetes medications:Lantus 40 units QHS, Novolog 8 units TID with meals, Metformin 500 mg BID Current orders for Inpatient glycemic control:Lantus 30 units bid,Novolog 6 units tid meal coverage,Novolog 0-20units TID with meals, Novolog 0-5 units QHS; Solumedrol 60mg  Q12H  Inpatient Diabetes Program Recommendations:   Noted postprandial CBGs elevated. -Increase Novolog meal coverage to 8-10 units tid if eats 50% Secure chat sent to Dr. .  Thank you, Shon Hale. Magie Ciampa, RN, MSN, CDE  Diabetes Coordinator Inpatient Glycemic Control Team Team Pager [phone] (8am-5pm) 03/28/2020 9:45 AM

## 2020-03-28 NOTE — Progress Notes (Signed)
Initial Nutrition Assessment  DOCUMENTATION CODES:   Obesity unspecified  INTERVENTION:  Ensure Enlive po BID, each supplement provides 350 kcal and 20 grams of protein  MVI with minerals daily     NUTRITION DIAGNOSIS:   Increased nutrient needs related to catabolic illness (pneumonia due to COVID-19 virus infection) as evidenced by estimated needs.    GOAL:   Patient will meet greater than or equal to 90% of their needs    MONITOR:   PO intake, Weight trends, Labs, I & O's, Supplement acceptance  REASON FOR ASSESSMENT:   Rounds    ASSESSMENT:  64 year old female with history of HTN, IDDM HLD, COPD, GERD, presented with 2 week history of worsening generalized weakness, decreased appetite, cough, and increasing shortness of breath. Pt admitted for pneumonia due to COVID-19 virus infection.  Pt admitted on 04/17/2020 with hypoxic respiratory failure requiring NRB at 15 L in ED. Severe hypoxia persists, pt currently requiring 40 L of heated high flow 100% with NRB mask, noted significant desaturations with coughing spells. Pt reported decreased appetite 2 weeks prior to admit and has had poor po intake during admit secondary to respiratory status. It appears po intake is improving, per flowsheets she consumed 100% x 3 meals yesterday. Pt with high nutrition needs due to catabolic nature virus and is at risk for malnutrition would greatly benefit from nutrient dense supplement. One Ensure Enlive supplement provides 350 kcals, 20 grams protein, and 44-45 grams of carbohydrate vs one Glucerna shake supplement, which provides 220 kcals, 10 grams of protein, and 26 grams of carbohydrate.  Pt has poorly controlled DM, last A1c of 10.3. Elevated CBGs noted, pt receiving IV steroids, 60mg  every 12 hours, hyperglycemia expected. RD will reassess adequacy of PO intake, CBGS, and adjust supplement regimen as appropriate at follow-up.   Weights have remained stable this admission.   No bowel  movement in 7 days per flowsheets, on bowel regimen.  Medications reviewed and include: Vit C, Gabapentin, SSI Lantus 30 units 2x daily, Methylprednisolone, Miralax, Senokot  Labs: CBGs 322,165,403, 357,236 x 24 hrs, Na 134 (L) 9/28 A1c 10.3  NUTRITION - FOCUSED PHYSICAL EXAM: Unable to complete at this time, pt on airborne/contact precautions. RD working remotely    Diet Order:   Diet Order            Diet heart healthy/carb modified Room service appropriate? Yes; Fluid consistency: Thin  Diet effective now                 EDUCATION NEEDS:   Not appropriate for education at this time  Skin:  Skin Assessment: Reviewed RN Assessment  Last BM:  9/30  Height:   Ht Readings from Last 1 Encounters:  03/22/20 5\' 2"  (1.575 m)    Weight:   Wt Readings from Last 1 Encounters:  03/28/20 90.6 kg    Ideal Body Weight:  50 kg  BMI:  Body mass index is 36.53 kg/m.  Estimated Nutritional Needs: Based on AdjBW 66.24 kg  Kcal:  2153-2252  Protein:  100-115  Fluid:  >/= 2.1 L   05/28/20, RD, LDN Clinical Nutrition After Hours/Weekend Pager # in Amion

## 2020-03-29 DIAGNOSIS — U071 COVID-19: Secondary | ICD-10-CM | POA: Diagnosis not present

## 2020-03-29 DIAGNOSIS — J1282 Pneumonia due to Coronavirus disease 2019: Secondary | ICD-10-CM | POA: Diagnosis not present

## 2020-03-29 LAB — COMPREHENSIVE METABOLIC PANEL
ALT: 41 U/L (ref 0–44)
AST: 46 U/L — ABNORMAL HIGH (ref 15–41)
Albumin: 2.5 g/dL — ABNORMAL LOW (ref 3.5–5.0)
Alkaline Phosphatase: 193 U/L — ABNORMAL HIGH (ref 38–126)
Anion gap: 12 (ref 5–15)
BUN: 29 mg/dL — ABNORMAL HIGH (ref 8–23)
CO2: 26 mmol/L (ref 22–32)
Calcium: 8.3 mg/dL — ABNORMAL LOW (ref 8.9–10.3)
Chloride: 93 mmol/L — ABNORMAL LOW (ref 98–111)
Creatinine, Ser: 0.88 mg/dL (ref 0.44–1.00)
GFR calc non Af Amer: >60 mL/min (ref >60)
Glucose, Bld: 208 mg/dL — ABNORMAL HIGH (ref 70–99)
Potassium: 4.3 mmol/L (ref 3.5–5.1)
Sodium: 131 mmol/L — ABNORMAL LOW (ref 135–145)
Total Bilirubin: 0.6 mg/dL (ref 0.3–1.2)
Total Protein: 6.8 g/dL (ref 6.5–8.1)

## 2020-03-29 LAB — BASIC METABOLIC PANEL
Anion gap: 11 (ref 5–15)
BUN: 30 mg/dL — ABNORMAL HIGH (ref 8–23)
CO2: 25 mmol/L (ref 22–32)
Calcium: 7.9 mg/dL — ABNORMAL LOW (ref 8.9–10.3)
Chloride: 93 mmol/L — ABNORMAL LOW (ref 98–111)
Creatinine, Ser: 0.91 mg/dL (ref 0.44–1.00)
GFR calc non Af Amer: >60 mL/min (ref >60)
Glucose, Bld: 420 mg/dL — ABNORMAL HIGH (ref 70–99)
Potassium: 4.7 mmol/L (ref 3.5–5.1)
Sodium: 129 mmol/L — ABNORMAL LOW (ref 135–145)

## 2020-03-29 LAB — D-DIMER, QUANTITATIVE: D-Dimer, Quant: 2.07 ug/mL-FEU — ABNORMAL HIGH (ref 0.00–0.50)

## 2020-03-29 LAB — GLUCOSE, CAPILLARY
Glucose-Capillary: 233 mg/dL — ABNORMAL HIGH (ref 70–99)
Glucose-Capillary: 392 mg/dL — ABNORMAL HIGH (ref 70–99)
Glucose-Capillary: 108 mg/dL — ABNORMAL HIGH (ref 70–99)
Glucose-Capillary: 415 mg/dL — ABNORMAL HIGH (ref 70–99)

## 2020-03-29 LAB — C-REACTIVE PROTEIN: CRP: 8.6 mg/dL — ABNORMAL HIGH (ref <1.0)

## 2020-03-29 LAB — FERRITIN: Ferritin: 2143 ng/mL — ABNORMAL HIGH (ref 11–307)

## 2020-03-29 MED ORDER — GLUCERNA SHAKE PO LIQD
237.0000 mL | Freq: Two times a day (BID) | ORAL | Status: DC
  Administered 2020-03-29 – 2020-04-01 (×7): 237 mL via ORAL

## 2020-03-29 MED ORDER — SALINE SPRAY 0.65 % NA SOLN
1.0000 | NASAL | Status: DC | PRN
  Administered 2020-03-29 (×2): 1 via NASAL

## 2020-03-29 MED ORDER — INSULIN ASPART 100 UNIT/ML ~~LOC~~ SOLN
8.0000 [IU] | Freq: Three times a day (TID) | SUBCUTANEOUS | Status: DC
  Administered 2020-03-30 – 2020-03-31 (×5): 8 [IU] via SUBCUTANEOUS

## 2020-03-29 MED ORDER — INSULIN ASPART 100 UNIT/ML ~~LOC~~ SOLN
7.0000 [IU] | Freq: Once | SUBCUTANEOUS | Status: AC
  Administered 2020-03-29: 7 [IU] via SUBCUTANEOUS

## 2020-03-29 NOTE — TOC Progression Note (Deleted)
Transition of Care [facility]) - Progression Note    Patient Details  Name: [name] MRN: [id number] Date of Birth: 10/04/1955  Transition of Care [facility]) CM/SW Contact  Annice Needy, LCSW Phone Number: 03/29/2020, 11:18 AM  Clinical Narrative:       Expected Discharge Plan: Long Term Acute Care (LTAC) Barriers to Discharge: Continued Medical Work up  Expected Discharge Plan and Services Expected Discharge Plan: Long Term Acute Care (LTAC)       Living arrangements for the past 2 months: Single Family Home                                       Social Determinants of Health (SDOH) Interventions    Readmission Risk Interventions No flowsheet data found.

## 2020-03-29 NOTE — Progress Notes (Signed)
Patient Demographics:    [name], is a 64 y.o. female, DOB - 1955-09-22, UJW:[id number]  Admit date - 03-23-2020   Admitting Physician Frankey Shown, DO  Outpatient Primary MD for the patient is Pearson Grippe, MD  LOS - 9  No chief complaint on file.       Subjective:    [name] continues to feel short of breath overall.   03/29/20-- Still requiring heated high flow AND nonrebreather mask.  -Patient with significant desaturations with coughing spells -Currently requiring 70% FiO2 with 30 L high flow in addition to a nonrebreather bag  Oral intake is fair    Assessment  & Plan :    Principal Problem:   Pneumonia due to COVID-19 virus Active Problems:   Acute respiratory failure with hypoxia (HCC)   Severe sepsis (HCC)   Leukopenia   Thrombocytopenia (HCC)   Hyperglycemia due to diabetes mellitus (HCC)   Essential hypertension   Type 2 diabetes mellitus with hyperlipidemia (HCC)   Obesity (BMI 30-39.9)   Tobacco abuse   Lactic acidosis  Brief Summary:-  64 y.o. female with medical history significant for hypertension, type 2 DM, hyperlipidemia, obesity and tobacco abuse admitted on Mar 23, 2020 with acute hypoxic respiratory failure secondary to Covid Pneumonia -Patient is Not vaccinated against COVID-19 infection --Severe Hypoxia Persist and still requiring HFNC AND NRB-   A/p 1)Acute hypoxic respiratory failure secondary to COVID-19 infection/Pneumonia--- The treatment plan and use of medications  for treatment of COVID-19 infection and possible side effects were discussed with patient/family -----Patient/Family verbalizes understanding and agrees to treatment protocols --Patient still requiring high flow oxygen  03/29/20--Currently requiring 70% FiO2 with 30 L high flow in addition to a nonrebreather bag --Supplemental oxygen to keep O2 sats above 93% -Follow serial chest x-rays  and ABGs as indicated --- Encourage prone positioning for More than 16 hours/day in increments of 2 to 3 hours at a time if able to tolerate --Attempt to maintain euvolemic state --Zinc and vitamin C as ordered -Albuterol inhaler as needed -Accu-Cheks/fingersticks while on high-dose steroids -PPI while on high-dose steroids -Enhanced dosage of anticoagulant for DVT prophylaxis given hypercoagulable state with COVID-19 infections -Continue IV Steroids started on 03-23-20 -She has completed a course of remdesivir -Given persistent severe hypoxia and inflammatory markers-baricitinib started on 03/21/2020  -Repeat chest x-ray from 03/27/2020 consistent with Covid pneumonia which appears to have improved radiologically  COVID-19 Labs  Recent Labs    03/27/20 0533 03/28/20 0420 03/29/20 0506  DDIMER 2.12* 2.46* 2.07*  FERRITIN 1,613* 2,066* 2,143*  CRP 9.6* 16.9* 8.6*    Lab Results  Component Value Date   SARSCOV2NAA POSITIVE (A) Mar 23, 2020   SARSCOV2NAA NEGATIVE 02/15/2019   Actemra/Baricitinib:- The patient has hypoxia and is high-risk for intubation, but expected to survive >48 hours and has good baseline functional status.  She is not known to be on immunomodulators, anti-rejection medications, or cancer chemotherapy, has no history of TB or latent TB, and no history of diverticulitis or intestinal perforation.  Platelets are >50K, ANC is >500, and ALT/AST are below 5x ULN with no known hepatitis B infection. Baricitinib is being used under EUA by the FDA. The patient has no ESRD or AKI, known history of TB, severe neutropenia (ANC <  500) or lymphopenia, or severe LFT elevations. They are not on DMARDs or probenecid, and are not pregnant. The option to use/refuse baricitinib treatment under FDA authorization (not approval), the significant known and potential risks and benefits, the extent to which these are unknown, and information regarding all available alternatives were discussed in  detail. Specifically the risk of VTE and secondary infections were discussed in detail with the patient and/or HCPOA. They consent to proceed with treatment. - Continue Baricitinib (started 03/21/20), c/n 14 days or until hospital discharge. Monitor Cr, LFTs, differential. Keep on VTE ppx  2) COPD/tobacco abuse--- nicotine patch as ordered, steroids and bronchodilators as above #1  3)HLD-stopped Lipitor for now, watch LFTs closely with baricitinib use  4)HTN--currently on losartan, may use as needed IV labetalol for elevated BP  5)Leukopenia and Thrombocytopenia--- resolved on CBC from 03/22/20  6)Class II Obesity-- this increases overall morbidity and mortality -after resolution of acute hypoxic respiratory failure and Covid infection patient will be advised to adhere to low calorie diet, portion control and increase physical activity discussed with patient -Body mass index is 36.53 kg/m.  7) hyperglycemia--- anticipate worsening with steroids, Use Novolog/Humalog Sliding scale insulin with Accu-Cheks/Fingersticks as ordered -Insulin regimen adjusted due to persistent hyperglycemia  8)Social/Ethics----- RN [name] at bedside--- we had extensive conversation with patient regarding goals of care and advanced directives -Patient repeatedly states myself and the RN at bedside that she wants to be a DNR/DNI----she understands that given her worsening respiratory status she may die- --I called and updated patient's son ([name] at [phone] on patient's condition and patient's decision for DNR/DNI status  Disposition/Need for in-Hospital Stay- patient unable to be discharged at this time due to --severe hypoxic respiratory failure in the setting of COVID-19 pneumonia requiring IV steroids and supplemental oxygen  Status is: Inpatient  Remains inpatient appropriate because:severe hypoxic respiratory failure in the setting of COVID-19 pneumonia requiring  IV steroids and  supplemental oxygen  Disposition: The patient is from: Home              Anticipated d/c is to: Home              Anticipated d/c date is: > 3 days              Patient currently is not medically stable to d/c. Barriers: Not Clinically Stable- severe hypoxic respiratory failure in the setting of COVID-19 pneumonia requiring  IV steroids and supplemental oxygen  Code Status :  DNR  Family Communication:    (patient is alert, awake and coherent)  Discussed with pt's son Mr. Mariko Nowakowski at [phone]  Consults  :  na  DVT Prophylaxis  :  Lovenox -  - SCDs    Lab Results  Component Value Date   PLT 186 03/25/2020    Inpatient Medications  Scheduled Meds: . albuterol  2 puff Inhalation TID  . amLODipine  10 mg Oral Daily  . vitamin C  500 mg Oral Daily  . baricitinib  4 mg Oral Daily  . Chlorhexidine Gluconate Cloth  6 each Topical Daily  . enoxaparin (LOVENOX) injection  50 mg Subcutaneous Q24H  . feeding supplement (GLUCERNA SHAKE)  237 mL Oral BID BM  . gabapentin  300 mg Oral TID  . guaiFENesin  600 mg Oral BID  . insulin aspart  0-20 Units Subcutaneous TID WC  . insulin aspart  0-5 Units Subcutaneous QHS  . [START ON 03/30/2020] insulin aspart  8 Units  Subcutaneous TID WC  . insulin glargine  30 Units Subcutaneous BID  . mouth rinse  15 mL Mouth Rinse BID  . methylPREDNISolone (SOLU-MEDROL) injection  60 mg Intravenous Q12H  . multivitamin with minerals  1 tablet Oral Daily  . nicotine  21 mg Transdermal Daily  . pantoprazole  40 mg Oral Daily  . polyethylene glycol  17 g Oral Daily  . senna-docusate  2 tablet Oral QHS  . sodium chloride flush  10-40 mL Intracatheter Q12H  . zinc sulfate  220 mg Oral Daily   Continuous Infusions:  PRN Meds:.acetaminophen, chlorpheniramine-HYDROcodone, guaiFENesin-dextromethorphan, labetalol, melatonin, ondansetron **OR** ondansetron (ZOFRAN) IV, phenol, sodium chloride, sodium chloride flush    Anti-infectives (From admission,  onward)   Start     Dose/Rate Route Frequency Ordered Stop   03/21/20 1000  remdesivir 100 mg in sodium chloride 0.9 % 100 mL IVPB  Status:  Discontinued       "Followed by" Linked Group Details   100 mg 200 mL/hr over 30 Minutes Intravenous Daily 02/29/2020 2013 03/11/2020 2020   03/21/20 1000  remdesivir 100 mg in sodium chloride 0.9 % 100 mL IVPB        100 mg 200 mL/hr over 30 Minutes Intravenous Daily 03/13/2020 2025 03/24/20 0857   02/22/2020 2030  remdesivir 100 mg in sodium chloride 0.9 % 100 mL IVPB  Status:  Discontinued       "And" Linked Group Details   100 mg 200 mL/hr over 30 Minutes Intravenous Daily 03/02/2020 2016 03/16/2020 2019   03/10/2020 2030  remdesivir 100 mg in sodium chloride 0.9 % 100 mL IVPB  Status:  Discontinued       "And" Linked Group Details   100 mg 200 mL/hr over 30 Minutes Intravenous  Once 03/18/2020 2016 03/06/2020 2019   03/13/2020 2030  remdesivir 100 mg in sodium chloride 0.9 % 100 mL IVPB  Status:  Discontinued        100 mg 200 mL/hr over 30 Minutes Intravenous Daily 03/22/2020 2017 03/09/2020 2019   03/19/2020 2030  remdesivir 100 mg in sodium chloride 0.9 % 100 mL IVPB  Status:  Discontinued        100 mg 200 mL/hr over 30 Minutes Intravenous  Once 02/27/2020 2021 03/10/2020 2025   03/08/2020 2030  remdesivir 100 mg in sodium chloride 0.9 % 100 mL IVPB  Status:  Discontinued       "And" Linked Group Details   100 mg 200 mL/hr over 30 Minutes Intravenous Daily 02/29/2020 2023 03/21/20 1116   02/28/2020 2030  remdesivir 100 mg in sodium chloride 0.9 % 100 mL IVPB       "And" Linked Group Details   100 mg 200 mL/hr over 30 Minutes Intravenous  Once 03/18/2020 2023 03/10/2020 2225   02/29/2020 2013  remdesivir 200 mg in sodium chloride 0.9% 250 mL IVPB  Status:  Discontinued       "Followed by" Linked Group Details   200 mg 580 mL/hr over 30 Minutes Intravenous Once 02/28/2020 2013 02/27/2020 2020        Objective:   Vitals:   03/29/20 1331 03/29/20 1400 03/29/20 1500 03/29/20  1600  BP:  (!) 150/87 (!) 146/75 125/70  Pulse:  92 85 85  Resp:  19 (!) 22 19  Temp:    97.6 F (36.4 C)  TempSrc:    Oral  SpO2: 94% 95% 94% (!) 86%  Weight:      Height:  Wt Readings from Last 3 Encounters:  03/28/20 90.6 kg  09/14/19 94.1 kg  05/05/19 91.6 kg     Intake/Output Summary (Last 24 hours) at 03/29/2020 1715 Last data filed at 03/29/2020 0800 Gross per 24 hour  Intake 240 ml  Output 850 ml  Net -610 ml    Physical Exam General exam: Alert, awake, oriented x 3 Nose- NRB + HFNC  Respiratory system: Diminished breath sounds, occasional wheeze especially with coughing spells  cardiovascular system:RRR. No murmurs, rubs, gallops. Gastrointestinal system: Abdomen is soft, NT, ND, +BS   Central nervous system: Alert and oriented. No focal neurological deficits. Extremities: No C/C/E, +pedal pulses Skin: No rashes, lesions or ulcers Psychiatry: Judgement and insight appear normal. Mood & affect appropriate.     Data Review:   Micro Results Recent Results (from the past 240 hour(s))  Respiratory Panel by RT PCR (Flu A&B, Covid) - Nasopharyngeal Swab     Status: Abnormal   Collection Time: 03/02/2020  5:35 PM   Specimen: Nasopharyngeal Swab  Result Value Ref Range Status   SARS Coronavirus 2 by RT PCR POSITIVE (A) NEGATIVE Final    Comment: RESULT CALLED TO, READ BACK BY AND VERIFIED WITH: BETHEL,SANDRA @1844  ON 02/29/2020 BY JONES,T (NOTE) SARS-CoV-2 target nucleic acids are DETECTED.  SARS-CoV-2 RNA is generally detectable in upper respiratory specimens  during the acute phase of infection. Positive results are indicative of the presence of the identified virus, but do not rule out bacterial infection or co-infection with other pathogens not detected by the test. Clinical correlation with patient history and other diagnostic information is necessary to determine patient infection status. The expected result is Negative.  Fact Sheet for Patients:   https://www.moore.com/https://www.fda.gov/media/142436/download  Fact Sheet for Healthcare Providers: https://www.young.biz/https://www.fda.gov/media/142435/download  This test is not yet approved or cleared by the [place] FDA and  has been authorized for detection and/or diagnosis of SARS-CoV-2 by FDA under an Emergency Use Authorization (EUA).  This EUA will remain in effect (meaning this test  can be used) for the duration of  the COVID-19 declaration under Section 564(b)(1) of the Act, 21 U.S.C. section 360bbb-3(b)(1), unless the authorization is terminated or revoked sooner.      Influenza A by PCR NEGATIVE NEGATIVE Final   Influenza B by PCR NEGATIVE NEGATIVE Final    Comment: (NOTE) The Xpert Xpress SARS-CoV-2/FLU/RSV assay is intended as an aid in  the diagnosis of influenza from Nasopharyngeal swab specimens and  should not be used as a sole basis for treatment. Nasal washings and  aspirates are unacceptable for Xpert Xpress SARS-CoV-2/FLU/RSV  testing.  Fact Sheet for Patients: https://www.moore.com/https://www.fda.gov/media/142436/download  Fact Sheet for Healthcare Providers: https://www.young.biz/https://www.fda.gov/media/142435/download  This test is not yet approved or cleared by the [place] FDA and  has been authorized for detection and/or diagnosis of SARS-CoV-2 by  FDA under an Emergency Use Authorization (EUA). This EUA will remain  in effect (meaning this test can be used) for the duration of the  Covid-19 declaration under Section 564(b)(1) of the Act, 21  U.S.C. section 360bbb-3(b)(1), unless the authorization is  terminated or revoked. Performed at [facility], [address] [place], [place] [address]   Blood Culture (routine x 2)     Status: None   Collection Time: 03/06/2020  8:00 PM   Specimen: BLOOD LEFT ARM  Result Value Ref Range Status   Specimen Description BLOOD LEFT ARM  Final   Special Requests   Final    BOTTLES DRAWN AEROBIC AND ANAEROBIC  Blood Culture adequate volume   Culture   Final    NO GROWTH 5  DAYS Performed at [facility], [address] [place], [place] [address]    Report Status 03/25/2020 FINAL  Final  Blood Culture (routine x 2)     Status: None   Collection Time: 04-06-20  8:11 PM   Specimen: BLOOD LEFT HAND  Result Value Ref Range Status   Specimen Description BLOOD LEFT HAND  Final   Special Requests   Final    BOTTLES DRAWN AEROBIC AND ANAEROBIC Blood Culture adequate volume   Culture   Final    NO GROWTH 5 DAYS Performed at [facility], [address], [place], [place] [address]    Report Status 03/25/2020 FINAL  Final  MRSA PCR Screening     Status: None   Collection Time: 03/22/20 10:30 PM   Specimen: Nasal Mucosa; Nasopharyngeal  Result Value Ref Range Status   MRSA by PCR NEGATIVE NEGATIVE Final    Comment:        The GeneXpert MRSA Assay (FDA approved for NASAL specimens only), is one component of a comprehensive MRSA colonization surveillance program. It is not intended to diagnose MRSA infection nor to guide or monitor treatment for MRSA infections. Performed at [facility], [address] [place], [place] [address]     Radiology Reports DG CHEST PORT 1 VIEW  Result Date: 03/27/2020 CLINICAL DATA:  Acute respiratory failure, COVID-19 positivity EXAM: PORTABLE CHEST 1 VIEW COMPARISON:  04/06/2020 FINDINGS: Cardiac shadow is stable. Mild airspace opacities are noted bilaterally consistent with the given clinical history but improved when compared with the prior exam. No sizable effusion is seen. No bony abnormality is noted. IMPRESSION: Improved bilateral infiltrates consistent with the given clinical history. Electronically Signed   By: Alcide Clever M.D.   On: 03/27/2020 20:53   DG Chest Portable 1 View  Result Date: 04-06-20 CLINICAL DATA:  Weakness, shortness of breath and cough. EXAM: PORTABLE CHEST 1 VIEW COMPARISON:  March 09, 2018 FINDINGS: Moderate severity bilateral multifocal infiltrates are seen. There is no evidence of a pleural  effusion or pneumothorax. The heart size and mediastinal contours are within normal limits. The visualized skeletal structures are unremarkable. IMPRESSION: Moderate severity bilateral multifocal infiltrates. Electronically Signed   By: Aram Candela M.D.   On: 04-06-20 18:33   [place] EKG SITE RITE  Result Date: 03/23/2020 If Site Rite image not attached, placement could not be confirmed due to current cardiac rhythm.    CBC Recent Labs  Lab 03/25/20 0436  WBC 9.3  HGB 13.2  HCT 39.1  PLT 186  MCV 84.4  MCH 28.5  MCHC 33.8  RDW 13.1  LYMPHSABS 1.0  MONOABS 0.6  EOSABS 0.0  BASOSABS 0.0    Chemistries  Recent Labs  Lab 03/23/20 0617 03/25/20 0436 03/27/20 0533 03/28/20 0420 03/29/20 0506  NA 139 137 134* 134* 131*  K 4.4 4.1 3.5 4.0 4.3  CL 107 103 96* 96* 93*  CO2 21* 25 28 28 26   GLUCOSE 321* 121* 71 162* 208*  BUN 30* 25* 30* 34* 29*  CREATININE 0.83 0.66 0.79 0.85 0.88  CALCIUM 8.5* 8.3* 8.2* 8.3* 8.3*  MG 2.0 1.8  --   --   --   AST 57* 40 58* 41 46*  ALT 26 24 34 36 41  ALKPHOS 130* 144* 150* 168* 193*  BILITOT 0.4 0.3 0.7 0.7 0.6    No results for input(s): CHOL, HDL, LDLCALC,  TRIG, CHOLHDL, LDLDIRECT in the last 72 hours.  Lab Results  Component Value Date   HGBA1C 10.3 (H) 03/06/2020   ------------------------------------------------------------------------------------------------------------------ No results for input(s): TSH, T4TOTAL, T3FREE, THYROIDAB in the last 72 hours.  Invalid input(s): FREET3 ------------------------------------------------------------------------------------------------------------------ Recent Labs    03/28/20 0420 03/29/20 0506  FERRITIN 2,066* 2,143*    Coagulation profile No results for input(s): INR, PROTIME in the last 168 hours.  Recent Labs    03/28/20 0420 03/29/20 0506  DDIMER 2.46* 2.07*    Cardiac Enzymes No results for input(s): CKMB, TROPONINI, MYOGLOBIN in the last 168 hours.  Invalid  input(s): CK ------------------------------------------------------------------------------------------------------------------ No results found for: BNP   Shon Hale M.D on 03/29/2020 at 5:15 PM  Go to [email or web address] - for contact info  [facility] - Office  [phone]

## 2020-03-29 NOTE — TOC Initial Note (Signed)
Transition of Care [facility]) - Initial/Assessment Note    Patient Details  Name: [name] MRN: [id number] Date of Birth: 22-Dec-1955  [facility] CM/SW Contact:    Annice Needy, LCSW Phone Number: 03/29/2020, 11:17 AM  Clinical Narrative:                 Patient from home. Admitted COVID+. Per attending referral made to LTAC. Referral provided to [facility] with [facility].   Expected Discharge Plan: Long Term Acute Care (LTAC) Barriers to Discharge: Continued Medical Work up   Patient Goals and CMS Choice        Expected Discharge Plan and Services Expected Discharge Plan: Long Term Acute Care (LTAC)       Living arrangements for the past 2 months: Single Family Home                                      Prior Living Arrangements/Services Living arrangements for the past 2 months: Single Family Home   Patient language and need for interpreter reviewed:: Yes        Need for Family Participation in Patient Care: Yes (Comment) Care giver support system in place?: Yes (comment)   Criminal Activity/Legal Involvement Pertinent to Current Situation/Hospitalization: No - Comment as needed  Activities of Daily Living Home Assistive Devices/Equipment: None ADL Screening (condition at time of admission) Patient's cognitive ability adequate to safely complete daily activities?: Yes Is the patient deaf or have difficulty hearing?: No Does the patient have difficulty seeing, even when wearing glasses/contacts?: No Does the patient have difficulty concentrating, remembering, or making decisions?: No Patient able to express need for assistance with ADLs?: Yes Does the patient have difficulty dressing or bathing?: No Independently performs ADLs?: Yes (appropriate for developmental age) Does the patient have difficulty walking or climbing stairs?: No Weakness of Legs: None Weakness of Arms/Hands: None  Permission Sought/Granted Permission sought to share  information with : Other (comment)    Share Information with NAME: Irving Burton  Permission granted to share info w AGENCY: [facility]        Emotional Assessment         Alcohol / Substance Use: Not Applicable Psych Involvement: No (comment)  Admission diagnosis:  Acute hypoxemic respiratory failure due to COVID-19 (HCC) [U07.1, J96.01] Pneumonia due to COVID-19 virus [U07.1, J12.82] Patient Active Problem List   Diagnosis Date Noted  . Pneumonia due to COVID-19 virus 03/11/2020  . Acute respiratory failure with hypoxia (HCC) 03/01/2020  . Severe sepsis (HCC) 03/22/2020  . Leukopenia 03/21/2020  . Thrombocytopenia (HCC) 02/24/2020  . Hyperglycemia due to diabetes mellitus (HCC) 02/22/2020  . Essential hypertension 02/29/2020  . Type 2 diabetes mellitus with hyperlipidemia (HCC) 02/25/2020  . Obesity (BMI 30-39.9) 02/27/2020  . Tobacco abuse 03/14/2020  . Lactic acidosis 03/22/2020  . Ventral hernia 02/13/2019  . Encounter for screening colonoscopy 12/22/2018  . Abdominal mass 12/22/2018  . Neuralgia involving scalp 02/09/2016  . Cerebral aneurysm, nonruptured 02/15/2015  . Cerebral aneurysm 09/07/2014   PCP:  Pearson Grippe, MD Pharmacy:   [facility] [place], [place] - [address] [place] [address] Phone: [phone] Fax: [phone]     Social Determinants of Health (SDOH) Interventions    Readmission Risk Interventions No flowsheet data found.

## 2020-03-29 NOTE — Plan of Care (Signed)
  Problem: Education: Goal: Knowledge of General Education information will improve Description Including pain rating scale, medication(s)/side effects and non-pharmacologic comfort measures Outcome: Progressing   Problem: Elimination: Goal: Will not experience complications related to urinary retention Outcome: Progressing   Problem: Safety: Goal: Ability to remain free from injury will improve Outcome: Progressing   

## 2020-03-29 NOTE — Progress Notes (Signed)
Inpatient Diabetes Program Recommendations  AACE/ADA: New Consensus Statement on Inpatient Glycemic Control (2015)  Target Ranges:  Prepandial:   less than 140 mg/dL      Peak postprandial:   less than 180 mg/dL (1-2 hours)      Critically ill patients:  140 - 180 mg/dL   Lab Results  Component Value Date   GLUCAP 233 (H) 03/29/2020   HGBA1C 10.3 (H) 03/18/2020    Review of Glycemic Control Results for [name], [name] (MRN [id number]) as of 03/29/2020 11:04  Ref. Range 03/28/2020 08:48 03/28/2020 11:22 03/28/2020 16:35 03/28/2020 20:13 03/29/2020 09:36  Glucose-Capillary Latest Ref Range: 70 - 99 mg/dL 811 (H) 572 (H) 620 (H) 281 (H) 233 (H)   Diabetes history:DM2 Outpatient Diabetes medications:Lantus 40 units QHS, Novolog 8 units TID with meals, Metformin 500 mg BID Current orders for Inpatient glycemic control:Lantus30units bid,Novolog6units tid meal coverage,Novolog 0-20units TID with meals, Novolog 0-5 units QHS; Solumedrol 60mg  Q12H  Inpatient Diabetes Program Recommendations:   Noted postprandial CBGs continue elevated. -Increase Novolog meal coverage to 8-10 units tid if eats 50% Secure chat sent to Dr. .  Thank you, [name]. Tina Gawthrop, RN, MSN, CDE  Diabetes Coordinator Inpatient Glycemic Control Team Team Pager [phone] (8am-5pm) 03/29/2020 11:05 AM

## 2020-03-29 NOTE — TOC Progression Note (Signed)
Transition of Care [facility]) - Progression Note    Patient Details  Name: [name] MRN: [id number] Date of Birth: 04/22/1956  [facility]) CM/SW Contact  Annice Needy, LCSW Phone Number: 03/29/2020, 11:22 AM  Clinical Narrative:    Alcario Drought at [facility] advises that patient will have to be 65% FI02 and showing signs of improvement before she could be considered for [facility].    Expected Discharge Plan: Long Term Acute Care (LTAC) Barriers to Discharge: Continued Medical Work up  Expected Discharge Plan and Services Expected Discharge Plan: Long Term Acute Care (LTAC)       Living arrangements for the past 2 months: Single Family Home                                       Social Determinants of Health (SDOH) Interventions    Readmission Risk Interventions No flowsheet data found.

## 2020-03-29 NOTE — Progress Notes (Signed)
[place], NP notified that pt's CBG 415.  Lantus given.  BMP order placed and collected.

## 2020-03-30 ENCOUNTER — Inpatient Hospital Stay (HOSPITAL_COMMUNITY): Payer: Medicare Other

## 2020-03-30 DIAGNOSIS — U071 COVID-19: Secondary | ICD-10-CM | POA: Diagnosis not present

## 2020-03-30 DIAGNOSIS — J1282 Pneumonia due to Coronavirus disease 2019: Secondary | ICD-10-CM | POA: Diagnosis not present

## 2020-03-30 LAB — GLUCOSE, CAPILLARY
Glucose-Capillary: 208 mg/dL — ABNORMAL HIGH (ref 70–99)
Glucose-Capillary: 348 mg/dL — ABNORMAL HIGH (ref 70–99)
Glucose-Capillary: 138 mg/dL — ABNORMAL HIGH (ref 70–99)
Glucose-Capillary: 37 mg/dL — CL (ref 70–99)
Glucose-Capillary: 177 mg/dL — ABNORMAL HIGH (ref 70–99)

## 2020-03-30 LAB — COMPREHENSIVE METABOLIC PANEL
ALT: 44 U/L (ref 0–44)
AST: 44 U/L — ABNORMAL HIGH (ref 15–41)
Albumin: 2.3 g/dL — ABNORMAL LOW (ref 3.5–5.0)
Alkaline Phosphatase: 191 U/L — ABNORMAL HIGH (ref 38–126)
Anion gap: 9 (ref 5–15)
BUN: 24 mg/dL — ABNORMAL HIGH (ref 8–23)
CO2: 27 mmol/L (ref 22–32)
Calcium: 8 mg/dL — ABNORMAL LOW (ref 8.9–10.3)
Chloride: 96 mmol/L — ABNORMAL LOW (ref 98–111)
Creatinine, Ser: 0.75 mg/dL (ref 0.44–1.00)
GFR calc non Af Amer: >60 mL/min (ref >60)
Glucose, Bld: 266 mg/dL — ABNORMAL HIGH (ref 70–99)
Potassium: 4.3 mmol/L (ref 3.5–5.1)
Sodium: 132 mmol/L — ABNORMAL LOW (ref 135–145)
Total Bilirubin: 0.5 mg/dL (ref 0.3–1.2)
Total Protein: 6.4 g/dL — ABNORMAL LOW (ref 6.5–8.1)

## 2020-03-30 LAB — D-DIMER, QUANTITATIVE: D-Dimer, Quant: 2.3 ug/mL-FEU — ABNORMAL HIGH (ref 0.00–0.50)

## 2020-03-30 LAB — FERRITIN: Ferritin: 1789 ng/mL — ABNORMAL HIGH (ref 11–307)

## 2020-03-30 LAB — C-REACTIVE PROTEIN: CRP: 8.3 mg/dL — ABNORMAL HIGH (ref <1.0)

## 2020-03-30 MED ORDER — DEXTROSE 50 % IV SOLN
25.0000 g | INTRAVENOUS | Status: AC
  Administered 2020-03-30: 25 g via INTRAVENOUS

## 2020-03-30 MED ORDER — IOHEXOL 350 MG/ML SOLN: 100.0000 mL | Freq: Once | INTRAVENOUS | Status: DC | PRN

## 2020-03-30 MED ORDER — DEXTROSE 50 % IV SOLN
INTRAVENOUS | Status: AC
  Administered 2020-03-30: 50 mL
  Filled 2020-03-30: qty 50

## 2020-03-30 NOTE — TOC Progression Note (Signed)
Transition of Care [facility]) - Progression Note   Patient Details  Name: [name] MRN: [id number] Date of Birth: 1956/04/19  Transition of Care [facility]) CM/SW Contact  Ewing Schlein, LCSW Phone Number: 03/30/2020, 12:36 PM  Clinical Narrative: TOC received voicemail from [place] with [facility] stating the patient is too unstable at this time and will need to come off the non-rebreather to be considered for placement. [facility] will continue to monitor the patient. TOC to follow.  Expected Discharge Plan: Long Term Acute Care (LTAC) Barriers to Discharge: Continued Medical Work up  Expected Discharge Plan and Services Expected Discharge Plan: Long Term Acute Care (LTAC) Living arrangements for the past 2 months: Single Family Home  Readmission Risk Interventions No flowsheet data found.

## 2020-03-30 NOTE — Progress Notes (Signed)
Decreased HFNC to 20 lpm / increased oxygen to 90 ----- saturation 94.   Patient also wear nrb mask and is asleep.

## 2020-03-30 NOTE — Progress Notes (Signed)
Patient Demographics:    [name], is a 64 y.o. female, DOB - Aug 25, 1955, BSJ:[id number]  Admit date - 03/11/2020   Admitting Physician Frankey Shown, DO  Outpatient Primary MD for the patient is Pearson Grippe, MD  LOS - 10  No chief complaint on file.       Subjective:    Kenya Kook continues to feel short of breath overall.   03/30/20-- Still requiring heated high flow AND nonrebreather mask.  -Patient with significant desaturations with coughing spells -Hypoxia and dyspnea worsened overnight oxygen had to be turned up -Currently requiring 80% FiO2 with 45 L high flow in addition to a nonrebreather bag  Oral intake is fair  -Called and left voicemail for patient's son   Assessment  & Plan :    Principal Problem:   Pneumonia due to COVID-19 virus Active Problems:   Acute respiratory failure with hypoxia (HCC)   Severe sepsis (HCC)   Leukopenia   Thrombocytopenia (HCC)   Hyperglycemia due to diabetes mellitus (HCC)   Essential hypertension   Type 2 diabetes mellitus with hyperlipidemia (HCC)   Obesity (BMI 30-39.9)   Tobacco abuse   Lactic acidosis  Brief Summary:-  64 y.o. female with medical history significant for hypertension, type 2 DM, hyperlipidemia, obesity and tobacco abuse admitted on 03/09/2020 with acute hypoxic respiratory failure secondary to Covid Pneumonia -Patient is Not vaccinated against COVID-19 infection --Severe Hypoxia Persist and still requiring HFNC AND NRB-   A/p 1)Acute hypoxic respiratory failure secondary to COVID-19 infection/Pneumonia--- The treatment plan and use of medications  for treatment of COVID-19 infection and possible side effects were discussed with patient/family -----Patient/Family verbalizes understanding and agrees to treatment protocols --Patient still requiring high flow oxygen  03/30/20---Hypoxia and dyspnea worsened overnight oxygen  had to be turned up -Currently requiring 80% FiO2 with 45 L high flow in addition to a nonrebreather bag --Supplemental oxygen to keep O2 sats above 93% -Follow serial chest x-rays and ABGs as indicated --- Encourage prone positioning for More than 16 hours/day in increments of 2 to 3 hours at a time if able to tolerate --Attempt to maintain euvolemic state --Zinc and vitamin C as ordered -Albuterol inhaler as needed -Accu-Cheks/fingersticks while on high-dose steroids -PPI while on high-dose steroids -Enhanced dosage of anticoagulant for DVT prophylaxis given hypercoagulable state with COVID-19 infections -Continue IV Steroids started on 03/01/2020 -She has completed a course of remdesivir -Given persistent severe hypoxia and inflammatory markers-baricitinib started on 03/21/2020  -Repeat chest x-ray from 03/27/2020 consistent with Covid pneumonia which appears to have improved radiologically -Given persistent severe hypoxemia will check CTA chest to rule out PE  COVID-19 Labs  Recent Labs    03/28/20 0420 03/29/20 0506 03/30/20 0459  DDIMER 2.46* 2.07* 2.30*  FERRITIN 2,066* 2,143* 1,789*  CRP 16.9* 8.6* 8.3*    Lab Results  Component Value Date   SARSCOV2NAA POSITIVE (A) 03/08/2020   SARSCOV2NAA NEGATIVE 02/15/2019   Actemra/Baricitinib:- The patient has hypoxia and is high-risk for intubation, but expected to survive >48 hours and has good baseline functional status.  She is not known to be on immunomodulators, anti-rejection medications, or cancer chemotherapy, has no history of TB or latent TB, and no history of diverticulitis or intestinal perforation.  Platelets are >50K, ANC is >500, and ALT/AST are below 5x ULN with no known hepatitis B infection. Baricitinib is being used under EUA by the FDA. The patient has no ESRD or AKI, known history of TB, severe neutropenia (ANC <500) or lymphopenia, or severe LFT elevations. They are not on DMARDs or probenecid, and are not  pregnant. The option to use/refuse baricitinib treatment under FDA authorization (not approval), the significant known and potential risks and benefits, the extent to which these are unknown, and information regarding all available alternatives were discussed in detail. Specifically the risk of VTE and secondary infections were discussed in detail with the patient and/or HCPOA. They consent to proceed with treatment. - Continue Baricitinib (started 03/21/20), c/n 14 days or until hospital discharge. Monitor Cr, LFTs, differential. Keep on VTE ppx  2) COPD/tobacco abuse--- nicotine patch as ordered, steroids and bronchodilators as above #1  3)HLD-stopped Lipitor for now, watch LFTs closely with baricitinib use  4)HTN--currently on losartan, may use as needed IV labetalol for elevated BP  5)Leukopenia and Thrombocytopenia--- resolved on CBC from 03/22/20  6)Class II Obesity-- this increases overall morbidity and mortality -after resolution of acute hypoxic respiratory failure and Covid infection patient will be advised to adhere to low calorie diet, portion control and increase physical activity discussed with patient -Body mass index is 36.53 kg/m.  7) hyperglycemia--- anticipate worsening with steroids, Use Novolog/Humalog Sliding scale insulin with Accu-Cheks/Fingersticks as ordered -Insulin regimen adjusted due to persistent hyperglycemia  8)Social/Ethics----- RN Ms Welton FlakesHala Crabtree at bedside--- we had extensive conversation with patient regarding goals of care and advanced directives -Patient repeatedly states myself and the RN at bedside that she wants to be a DNR/DNI----she understands that given her worsening respiratory status she may die- --I called and updated patient's son (Mr. Jacqulyn BathDennis Corney at [phone] on patient's condition and patient's decision for DNR/DNI status  Disposition/Need for in-Hospital Stay- patient unable to be discharged at this time due to --severe hypoxic  respiratory failure in the setting of COVID-19 pneumonia requiring IV steroids and supplemental oxygen  Status is: Inpatient  Remains inpatient appropriate because:severe hypoxic respiratory failure in the setting of COVID-19 pneumonia requiring  IV steroids and supplemental oxygen  Disposition: The patient is from: Home              Anticipated d/c is to: Home              Anticipated d/c date is: > 3 days              Patient currently is not medically stable to d/c. Barriers: Not Clinically Stable- severe hypoxic respiratory failure in the setting of COVID-19 pneumonia requiring  IV steroids and supplemental oxygen  Code Status :  DNR  Family Communication:    (patient is alert, awake and coherent)  Discussed with pt's son Mr. Jacqulyn BathDennis Chism at [phone] --left voicemail for son on 03/30/2020 Consults  :  na  DVT Prophylaxis  :  Lovenox -  - SCDs    Lab Results  Component Value Date   PLT 186 03/25/2020    Inpatient Medications  Scheduled Meds: . albuterol  2 puff Inhalation TID  . amLODipine  10 mg Oral Daily  . vitamin C  500 mg Oral Daily  . baricitinib  4 mg Oral Daily  . Chlorhexidine Gluconate Cloth  6 each Topical Daily  . enoxaparin (LOVENOX) injection  50 mg Subcutaneous Q24H  . feeding supplement (GLUCERNA SHAKE)  237 mL Oral BID  BM  . gabapentin  300 mg Oral TID  . guaiFENesin  600 mg Oral BID  . insulin aspart  0-20 Units Subcutaneous TID WC  . insulin aspart  0-5 Units Subcutaneous QHS  . insulin aspart  8 Units Subcutaneous TID WC  . insulin glargine  30 Units Subcutaneous BID  . mouth rinse  15 mL Mouth Rinse BID  . methylPREDNISolone (SOLU-MEDROL) injection  60 mg Intravenous Q12H  . multivitamin with minerals  1 tablet Oral Daily  . nicotine  21 mg Transdermal Daily  . pantoprazole  40 mg Oral Daily  . polyethylene glycol  17 g Oral Daily  . senna-docusate  2 tablet Oral QHS  . sodium chloride flush  10-40 mL Intracatheter Q12H  . zinc sulfate   220 mg Oral Daily   Continuous Infusions:  PRN Meds:.acetaminophen, chlorpheniramine-HYDROcodone, guaiFENesin-dextromethorphan, labetalol, melatonin, ondansetron **OR** ondansetron (ZOFRAN) IV, phenol, sodium chloride, sodium chloride flush    Anti-infectives (From admission, onward)   Start     Dose/Rate Route Frequency Ordered Stop   03/21/20 1000  remdesivir 100 mg in sodium chloride 0.9 % 100 mL IVPB  Status:  Discontinued       "Followed by" Linked Group Details   100 mg 200 mL/hr over 30 Minutes Intravenous Daily 21-Mar-2020 2013 Mar 21, 2020 2020   03/21/20 1000  remdesivir 100 mg in sodium chloride 0.9 % 100 mL IVPB        100 mg 200 mL/hr over 30 Minutes Intravenous Daily Mar 21, 2020 2025 03/24/20 0857   2020-03-21 2030  remdesivir 100 mg in sodium chloride 0.9 % 100 mL IVPB  Status:  Discontinued       "And" Linked Group Details   100 mg 200 mL/hr over 30 Minutes Intravenous Daily Mar 21, 2020 2016 03-21-20 2019   03/21/20 2030  remdesivir 100 mg in sodium chloride 0.9 % 100 mL IVPB  Status:  Discontinued       "And" Linked Group Details   100 mg 200 mL/hr over 30 Minutes Intravenous  Once 03-21-2020 2016 21-Mar-2020 2019   2020-03-21 2030  remdesivir 100 mg in sodium chloride 0.9 % 100 mL IVPB  Status:  Discontinued        100 mg 200 mL/hr over 30 Minutes Intravenous Daily 03/21/20 2017 2020/03/21 2019   March 21, 2020 2030  remdesivir 100 mg in sodium chloride 0.9 % 100 mL IVPB  Status:  Discontinued        100 mg 200 mL/hr over 30 Minutes Intravenous  Once 03/21/2020 2021 2020-03-21 2025   2020-03-21 2030  remdesivir 100 mg in sodium chloride 0.9 % 100 mL IVPB  Status:  Discontinued       "And" Linked Group Details   100 mg 200 mL/hr over 30 Minutes Intravenous Daily March 21, 2020 2023 03/21/20 1116   March 21, 2020 2030  remdesivir 100 mg in sodium chloride 0.9 % 100 mL IVPB       "And" Linked Group Details   100 mg 200 mL/hr over 30 Minutes Intravenous  Once Mar 21, 2020 2023 March 21, 2020 2225   2020-03-21 2013   remdesivir 200 mg in sodium chloride 0.9% 250 mL IVPB  Status:  Discontinued       "Followed by" Linked Group Details   200 mg 580 mL/hr over 30 Minutes Intravenous Once 2020-03-21 2013 2020/03/21 2020        Objective:   Vitals:   03/30/20 0500 03/30/20 0600 03/30/20 0912 03/30/20 1407  BP: (!) 145/86 (!) 146/85    Pulse: 80 72  Resp: 15 15    Temp:      TempSrc:      SpO2: 100% 100% (!) 88% 98%  Weight:      Height:        Wt Readings from Last 3 Encounters:  03/28/20 90.6 kg  09/14/19 94.1 kg  05/05/19 91.6 kg     Intake/Output Summary (Last 24 hours) at 03/30/2020 1810 Last data filed at 03/30/2020 0930 Gross per 24 hour  Intake 400 ml  Output 850 ml  Net -450 ml    Physical Exam General exam: Alert, awake, oriented x 3 Nose- NRB + HFNC  Respiratory system: Diminished breath sounds, occasional wheeze especially with coughing spells  cardiovascular system:RRR. No murmurs, rubs, gallops. Gastrointestinal system: Abdomen is soft, NT, ND, +BS   Central nervous system: Alert and oriented. No focal neurological deficits. Extremities: No C/C/E, +pedal pulses Skin: No rashes, lesions or ulcers Psychiatry: Judgement and insight appear normal. Mood & affect appropriate.     Data Review:   Micro Results Recent Results (from the past 240 hour(s))  Blood Culture (routine x 2)     Status: None   Collection Time: 03/21/2020  8:00 PM   Specimen: BLOOD LEFT ARM  Result Value Ref Range Status   Specimen Description BLOOD LEFT ARM  Final   Special Requests   Final    BOTTLES DRAWN AEROBIC AND ANAEROBIC Blood Culture adequate volume   Culture   Final    NO GROWTH 5 DAYS Performed at [facility], [address] [place], [place] [address]    Report Status 03/25/2020 FINAL  Final  Blood Culture (routine x 2)     Status: None   Collection Time: 03/06/2020  8:11 PM   Specimen: BLOOD LEFT HAND  Result Value Ref Range Status   Specimen Description BLOOD LEFT HAND  Final    Special Requests   Final    BOTTLES DRAWN AEROBIC AND ANAEROBIC Blood Culture adequate volume   Culture   Final    NO GROWTH 5 DAYS Performed at [facility], [address] [place], [place] [address]    Report Status 03/25/2020 FINAL  Final  MRSA PCR Screening     Status: None   Collection Time: 03/22/20 10:30 PM   Specimen: Nasal Mucosa; Nasopharyngeal  Result Value Ref Range Status   MRSA by PCR NEGATIVE NEGATIVE Final    Comment:        The GeneXpert MRSA Assay (FDA approved for NASAL specimens only), is one component of a comprehensive MRSA colonization surveillance program. It is not intended to diagnose MRSA infection nor to guide or monitor treatment for MRSA infections. Performed at [facility], [address], [place], [place] [address]     Radiology Reports DG CHEST PORT 1 VIEW  Result Date: 03/27/2020 CLINICAL DATA:  Acute respiratory failure, COVID-19 positivity EXAM: PORTABLE CHEST 1 VIEW COMPARISON:  02/25/2020 FINDINGS: Cardiac shadow is stable. Mild airspace opacities are noted bilaterally consistent with the given clinical history but improved when compared with the prior exam. No sizable effusion is seen. No bony abnormality is noted. IMPRESSION: Improved bilateral infiltrates consistent with the given clinical history. Electronically Signed   By: Alcide Clever M.D.   On: 03/27/2020 20:53   DG Chest Portable 1 View  Result Date: 03/12/2020 CLINICAL DATA:  Weakness, shortness of breath and cough. EXAM: PORTABLE CHEST 1 VIEW COMPARISON:  March 09, 2018 FINDINGS: Moderate severity bilateral multifocal infiltrates are seen. There is no evidence of  a pleural effusion or pneumothorax. The heart size and mediastinal contours are within normal limits. The visualized skeletal structures are unremarkable. IMPRESSION: Moderate severity bilateral multifocal infiltrates. Electronically Signed   By: Aram Candela M.D.   On: 02/29/2020 18:33   [place] EKG SITE RITE  Result  Date: 03/23/2020 If Site Rite image not attached, placement could not be confirmed due to current cardiac rhythm.    CBC Recent Labs  Lab 03/25/20 0436  WBC 9.3  HGB 13.2  HCT 39.1  PLT 186  MCV 84.4  MCH 28.5  MCHC 33.8  RDW 13.1  LYMPHSABS 1.0  MONOABS 0.6  EOSABS 0.0  BASOSABS 0.0    Chemistries  Recent Labs  Lab 03/25/20 0436 03/25/20 0436 03/27/20 0533 03/28/20 0420 03/29/20 0506 03/29/20 2128 03/30/20 0459  NA 137   < > 134* 134* 131* 129* 132*  K 4.1   < > 3.5 4.0 4.3 4.7 4.3  CL 103   < > 96* 96* 93* 93* 96*  CO2 25   < > GLUCOSE 121*   < > 71 162* 208* 420* 266*  BUN 25*   < > 30* 34* 29* 30* 24*  CREATININE 0.66   < > 0.79 0.85 0.88 0.91 0.75  CALCIUM 8.3*   < > 8.2* 8.3* 8.3* 7.9* 8.0*  MG 1.8  --   --   --   --   --   --   AST 40  --  58* 41 46*  --  44*  ALT 24  --  34 36 41  --  44  ALKPHOS 144*  --  150* 168* 193*  --  191*  BILITOT 0.3  --  0.7 0.7 0.6  --  0.5   < > = values in this interval not displayed.    No results for input(s): CHOL, HDL, LDLCALC, TRIG, CHOLHDL, LDLDIRECT in the last 72 hours.  Lab Results  Component Value Date   HGBA1C 10.3 (H) 03/01/2020   ------------------------------------------------------------------------------------------------------------------ No results for input(s): TSH, T4TOTAL, T3FREE, THYROIDAB in the last 72 hours.  Invalid input(s): FREET3 ------------------------------------------------------------------------------------------------------------------ Recent Labs    03/29/20 0506 03/30/20 0459  FERRITIN 2,143* 1,789*    Coagulation profile No results for input(s): INR, PROTIME in the last 168 hours.  Recent Labs    03/29/20 0506 03/30/20 0459  DDIMER 2.07* 2.30*    Cardiac Enzymes No results for input(s): CKMB, TROPONINI, MYOGLOBIN in the last 168 hours.  Invalid input(s):  CK ------------------------------------------------------------------------------------------------------------------ No results found for: BNP   Shon Hale M.D on 03/30/2020 at 6:10 PM  Go to [email or web address] - for contact info  [facility] - Office  [phone]

## 2020-03-31 ENCOUNTER — Inpatient Hospital Stay (HOSPITAL_COMMUNITY): Payer: Medicare Other

## 2020-03-31 DIAGNOSIS — J1282 Pneumonia due to Coronavirus disease 2019: Secondary | ICD-10-CM | POA: Diagnosis not present

## 2020-03-31 DIAGNOSIS — U071 COVID-19: Secondary | ICD-10-CM | POA: Diagnosis not present

## 2020-03-31 LAB — C-REACTIVE PROTEIN: CRP: 11.3 mg/dL — ABNORMAL HIGH (ref <1.0)

## 2020-03-31 LAB — FERRITIN: Ferritin: 1883 ng/mL — ABNORMAL HIGH (ref 11–307)

## 2020-03-31 LAB — D-DIMER, QUANTITATIVE: D-Dimer, Quant: 2.73 ug/mL-FEU — ABNORMAL HIGH (ref 0.00–0.50)

## 2020-03-31 LAB — GLUCOSE, CAPILLARY
Glucose-Capillary: 106 mg/dL — ABNORMAL HIGH (ref 70–99)
Glucose-Capillary: 172 mg/dL — ABNORMAL HIGH (ref 70–99)
Glucose-Capillary: 195 mg/dL — ABNORMAL HIGH (ref 70–99)
Glucose-Capillary: 319 mg/dL — ABNORMAL HIGH (ref 70–99)
Glucose-Capillary: 90 mg/dL (ref 70–99)

## 2020-03-31 MED ORDER — ALPRAZOLAM 0.5 MG PO TABS
0.5000 mg | ORAL_TABLET | Freq: Two times a day (BID) | ORAL | Status: DC | PRN
  Administered 2020-03-31 – 2020-04-02 (×3): 0.5 mg via ORAL
  Filled 2020-03-31 (×3): qty 1

## 2020-03-31 MED ORDER — INSULIN ASPART 100 UNIT/ML ~~LOC~~ SOLN
4.0000 [IU] | Freq: Three times a day (TID) | SUBCUTANEOUS | Status: DC
  Administered 2020-03-31 – 2020-04-02 (×5): 4 [IU] via SUBCUTANEOUS

## 2020-03-31 MED ORDER — INSULIN GLARGINE 100 UNIT/ML ~~LOC~~ SOLN
28.0000 [IU] | Freq: Two times a day (BID) | SUBCUTANEOUS | Status: DC
  Administered 2020-03-31 – 2020-04-02 (×4): 28 [IU] via SUBCUTANEOUS
  Filled 2020-03-31 (×6): qty 0.28

## 2020-03-31 NOTE — Progress Notes (Signed)
Pt has had low CBG's lowest being 37. Pt given 1 amp of D50 CBG came up to 117. Pt hard to arouse. Pt desating to 40's and 30's at times. Oxygen amount turned up. Pt unstable for transport at this time.

## 2020-03-31 NOTE — Progress Notes (Signed)
Patient Demographics:    [name], is a 64 y.o. female, DOB - April 21, 1956, ZOX:[id number]  Admit date - 03/18/2020   Admitting Physician Tina Shown, DO  Outpatient Primary MD for the patient is Tina Grippe, MD  LOS - 11  No chief complaint on file.       Subjective:    [name] continues to feel short of breath overall.   03/31/20-- Still requiring heated high flow AND nonrebreather mask.  -Patient with significant desaturations with coughing spells -Hypoxia and dyspnea persist-Currently requiring 80% FiO2 with 40 L high flow in addition to a nonrebreather bag  Oral intake is fair --had episodes of hypoglycemia -spoke with patient's son---    Assessment  & Plan :    Principal Problem:   Pneumonia due to COVID-19 virus Active Problems:   Acute respiratory failure with hypoxia (HCC)   Severe sepsis (HCC)   Leukopenia   Thrombocytopenia (HCC)   Hyperglycemia due to diabetes mellitus (HCC)   Essential hypertension   Type 2 diabetes mellitus with hyperlipidemia (HCC)   Obesity (BMI 30-39.9)   Tobacco abuse   Lactic acidosis  Brief Summary:-  64 y.o. female with medical history significant for hypertension, type 2 DM, hyperlipidemia, obesity and tobacco abuse admitted on 03/04/2020 with acute hypoxic respiratory failure secondary to Covid Pneumonia -Patient is Not vaccinated against COVID-19 infection --Severe Hypoxia Persist and still requiring HFNC AND NRB-   A/p 1)Acute hypoxic respiratory failure secondary to COVID-19 infection/Pneumonia--- The treatment plan and use of medications  for treatment of COVID-19 infection and possible side effects were discussed with patient/family -----Patient/Family verbalizes understanding and agrees to treatment protocols --Patient still requiring high flow oxygen  03/31/20---Hypoxia and dyspnea persist -Currently requiring 80% FiO2 with 40 L high  flow in addition to a nonrebreather bag --Supplemental oxygen to keep O2 sats above 93% -Follow serial chest x-rays and ABGs as indicated --- Encourage prone positioning for More than 16 hours/day in increments of 2 to 3 hours at a time if able to tolerate --Attempt to maintain euvolemic state --Zinc and vitamin C as ordered -Albuterol inhaler as needed -Accu-Cheks/fingersticks while on high-dose steroids -PPI while on high-dose steroids -Enhanced dosage of anticoagulant for DVT prophylaxis given hypercoagulable state with COVID-19 infections -Continue IV Steroids started on 02/26/2020 -She has completed a course of remdesivir -Given persistent severe hypoxia and inflammatory markers-baricitinib started on 03/21/2020  -Repeat chest x-ray from 03/27/2020 consistent with Covid pneumonia which appears to have improved radiologically -Given persistent severe hypoxemia will check CTA chest to rule out PE--- patient too unstable to go to CT scanner at this time due to persistent hypoxemia -Lower extremity venous Dopplers requested  COVID-19 Labs  Recent Labs    03/29/20 0506 03/30/20 0459 03/31/20 0330  DDIMER 2.07* 2.30* 2.73*  FERRITIN 2,143* 1,789* 1,883*  CRP 8.6* 8.3* 11.3*    Lab Results  Component Value Date   SARSCOV2NAA POSITIVE (A) 03/14/2020   SARSCOV2NAA NEGATIVE 02/15/2019   Actemra/Baricitinib:- The patient has hypoxia and is high-risk for intubation, but expected to survive >48 hours and has good baseline functional status.  She is not known to be on immunomodulators, anti-rejection medications, or cancer chemotherapy, has no history of TB or latent TB, and no history  of diverticulitis or intestinal perforation.  Platelets are >50K, ANC is >500, and ALT/AST are below 5x ULN with no known hepatitis B infection. Baricitinib is being used under EUA by the FDA. The patient has no ESRD or AKI, known history of TB, severe neutropenia (ANC <500) or lymphopenia, or severe LFT  elevations. They are not on DMARDs or probenecid, and are not pregnant. The option to use/refuse baricitinib treatment under FDA authorization (not approval), the significant known and potential risks and benefits, the extent to which these are unknown, and information regarding all available alternatives were discussed in detail. Specifically the risk of VTE and secondary infections were discussed in detail with the patient and/or HCPOA. They consent to proceed with treatment. - Continue Baricitinib (started 03/21/20), c/n 14 days or until hospital discharge. Monitor Cr, LFTs, differential. Keep on VTE ppx  2) COPD/tobacco abuse--- nicotine patch as ordered, steroids and bronchodilators as above #1  3)HLD-stopped Lipitor for now, watch LFTs closely with baricitinib use  4)HTN--currently on losartan, may use as needed IV labetalol for elevated BP  5)Leukopenia and Thrombocytopenia--- resolved on CBC from 03/22/20  6)Class II Obesity-- this increases overall morbidity and mortality -after resolution of acute hypoxic respiratory failure and Covid infection patient will be advised to adhere to low calorie diet, portion control and increase physical activity discussed with patient -Body mass index is 36.25 kg/m.  7) hyperglycemia--- patient had episodes of hypoglycemia,  --Insulin dosages have been adjusted  -- Use Novolog/Humalog Sliding scale insulin with Accu-Cheks/Fingersticks as ordered -Insulin regimen adjusted due to persistent hyperglycemia  8)Social/Ethics----- RN [name] at bedside--- we had extensive conversation with patient regarding goals of care and advanced directives -Patient repeatedly states myself and the RN at bedside that she wants to be a DNR/DNI----she understands that given her worsening respiratory status she may die- --I called and updated patient's son ([name] at [phone] on patient's condition and patient's decision for DNR/DNI  status  Disposition/Need for in-Hospital Stay- patient unable to be discharged at this time due to --severe hypoxic respiratory failure in the setting of COVID-19 pneumonia requiring IV steroids and supplemental oxygen  Status is: Inpatient  Remains inpatient appropriate because:severe hypoxic respiratory failure in the setting of COVID-19 pneumonia requiring  IV steroids and supplemental oxygen  Disposition: The patient is from: Home              Anticipated d/c is to: Home              Anticipated d/c date is: > 3 days              Patient currently is not medically stable to d/c. Barriers: Not Clinically Stable- severe hypoxic respiratory failure in the setting of COVID-19 pneumonia requiring  IV steroids and supplemental oxygen  Code Status :  DNR  Family Communication:    (patient is alert, awake and coherent)  Discussed with pt's son Mr. Vale Peraza at [phone] --spoke with  son on 03/31/2020 Consults  :  na  DVT Prophylaxis  :  Lovenox -  - SCDs    Lab Results  Component Value Date   PLT 186 03/25/2020    Inpatient Medications  Scheduled Meds: . albuterol  2 puff Inhalation TID  . amLODipine  10 mg Oral Daily  . vitamin C  500 mg Oral Daily  . baricitinib  4 mg Oral Daily  . Chlorhexidine Gluconate Cloth  6 each Topical Daily  . enoxaparin (LOVENOX) injection  50 mg Subcutaneous Q24H  . feeding supplement (GLUCERNA SHAKE)  237 mL Oral BID BM  . gabapentin  300 mg Oral TID  . guaiFENesin  600 mg Oral BID  . insulin aspart  0-20 Units Subcutaneous TID WC  . insulin aspart  0-5 Units Subcutaneous QHS  . insulin aspart  4 Units Subcutaneous TID WC  . insulin glargine  28 Units Subcutaneous BID  . mouth rinse  15 mL Mouth Rinse BID  . methylPREDNISolone (SOLU-MEDROL) injection  60 mg Intravenous Q12H  . multivitamin with minerals  1 tablet Oral Daily  . nicotine  21 mg Transdermal Daily  . pantoprazole  40 mg Oral Daily  . polyethylene glycol  17 g Oral Daily   . senna-docusate  2 tablet Oral QHS  . sodium chloride flush  10-40 mL Intracatheter Q12H  . zinc sulfate  220 mg Oral Daily   Continuous Infusions:  PRN Meds:.acetaminophen, chlorpheniramine-HYDROcodone, guaiFENesin-dextromethorphan, iohexol, labetalol, melatonin, ondansetron **OR** ondansetron (ZOFRAN) IV, phenol, sodium chloride, sodium chloride flush    Anti-infectives (From admission, onward)   Start     Dose/Rate Route Frequency Ordered Stop   03/21/20 1000  remdesivir 100 mg in sodium chloride 0.9 % 100 mL IVPB  Status:  Discontinued       "Followed by" Linked Group Details   100 mg 200 mL/hr over 30 Minutes Intravenous Daily 03/03/2020 2013 02/25/2020 2020   03/21/20 1000  remdesivir 100 mg in sodium chloride 0.9 % 100 mL IVPB        100 mg 200 mL/hr over 30 Minutes Intravenous Daily 03/03/2020 2025 03/24/20 0857   03/02/2020 2030  remdesivir 100 mg in sodium chloride 0.9 % 100 mL IVPB  Status:  Discontinued       "And" Linked Group Details   100 mg 200 mL/hr over 30 Minutes Intravenous Daily 03/06/2020 2016 03/19/2020 2019   03/07/2020 2030  remdesivir 100 mg in sodium chloride 0.9 % 100 mL IVPB  Status:  Discontinued       "And" Linked Group Details   100 mg 200 mL/hr over 30 Minutes Intravenous  Once 03/06/2020 2016 03/12/2020 2019   03/03/2020 2030  remdesivir 100 mg in sodium chloride 0.9 % 100 mL IVPB  Status:  Discontinued        100 mg 200 mL/hr over 30 Minutes Intravenous Daily 03/16/2020 2017 03/09/2020 2019   03/22/2020 2030  remdesivir 100 mg in sodium chloride 0.9 % 100 mL IVPB  Status:  Discontinued        100 mg 200 mL/hr over 30 Minutes Intravenous  Once 03/19/2020 2021 03/16/2020 2025   03/19/2020 2030  remdesivir 100 mg in sodium chloride 0.9 % 100 mL IVPB  Status:  Discontinued       "And" Linked Group Details   100 mg 200 mL/hr over 30 Minutes Intravenous Daily 03/22/2020 2023 03/21/20 1116   03/13/2020 2030  remdesivir 100 mg in sodium chloride 0.9 % 100 mL IVPB       "And" Linked  Group Details   100 mg 200 mL/hr over 30 Minutes Intravenous  Once 03/04/2020 2023 02/26/2020 2225   03/10/2020 2013  remdesivir 200 mg in sodium chloride 0.9% 250 mL IVPB  Status:  Discontinued       "Followed by" Linked Group Details   200 mg 580 mL/hr over 30 Minutes Intravenous Once 03/03/2020 2013 02/22/2020 2020        Objective:   Vitals:   03/31/20 1300 03/31/20 1400 03/31/20  1500 03/31/20 1606  BP:  140/71 (!) 151/79   Pulse: (!) 101 97 98 83  Resp: (!) 21 (!) 28 (!) 24   Temp:    98 F (36.7 C)  TempSrc:    Oral  SpO2: 100% 96% (!) 88% 94%  Weight:      Height:        Wt Readings from Last 3 Encounters:  03/31/20 89.9 kg  09/14/19 94.1 kg  05/05/19 91.6 kg     Intake/Output Summary (Last 24 hours) at 03/31/2020 1609 Last data filed at 03/31/2020 1606 Gross per 24 hour  Intake --  Output 1000 ml  Net -1000 ml    Physical Exam General exam: Alert, awake, oriented x 3 Nose- NRB + HFNC  Respiratory system: Diminished breath sounds, few scattered rhonchi cardiovascular system:RRR. No murmurs, rubs, gallops. Gastrointestinal system: Abdomen is soft, NT, ND, +BS   Central nervous system: Alert and oriented. No focal neurological deficits. Extremities: No C/C/E, +pedal pulses Skin: No rashes, lesions or ulcers Psychiatry: Judgement and insight appear normal. Mood & affect appropriate.     Data Review:   Micro Results Recent Results (from the past 240 hour(s))  MRSA PCR Screening     Status: None   Collection Time: 03/22/20 10:30 PM   Specimen: Nasal Mucosa; Nasopharyngeal  Result Value Ref Range Status   MRSA by PCR NEGATIVE NEGATIVE Final    Comment:        The GeneXpert MRSA Assay (FDA approved for NASAL specimens only), is one component of a comprehensive MRSA colonization surveillance program. It is not intended to diagnose MRSA infection nor to guide or monitor treatment for MRSA infections. Performed at [facility], [address] [place],  [place] [address]     Radiology Reports US Venous Img Lower Bilateral (DVT)  Result Date: 03/31/2020 CLINICAL DATA:  History of COVID-19 infection now with hypoxemia. Evaluate for DVT. EXAM: BILATERAL LOWER EXTREMITY VENOUS DOPPLER ULTRASOUND TECHNIQUE: Gray-scale sonography with graded compression, as well as color Doppler and duplex ultrasound were performed to evaluate the lower extremity deep venous systems from the level of the common femoral vein and including the common femoral, femoral, profunda femoral, popliteal and calf veins including the posterior tibial, peroneal and gastrocnemius veins when visible. The superficial great saphenous vein was also interrogated. Spectral Doppler was utilized to evaluate flow at rest and with distal augmentation maneuvers in the common femoral, femoral and popliteal veins. COMPARISON:  None. FINDINGS: RIGHT LOWER EXTREMITY Common Femoral Vein: No evidence of thrombus. Normal compressibility, respiratory phasicity and response to augmentation. Saphenofemoral Junction: No evidence of thrombus. Normal compressibility and flow on color Doppler imaging. Profunda Femoral Vein: No evidence of thrombus. Normal compressibility and flow on color Doppler imaging. Femoral Vein: No evidence of thrombus. Normal compressibility, respiratory phasicity and response to augmentation. Popliteal Vein: No evidence of thrombus. Normal compressibility, respiratory phasicity and response to augmentation. Calf Veins: No evidence of thrombus. Normal compressibility and flow on color Doppler imaging. Superficial Great Saphenous Vein: No evidence of thrombus. Normal compressibility. Venous Reflux:  None. Other Findings:  None. LEFT LOWER EXTREMITY Common Femoral Vein: No evidence of thrombus. Normal compressibility, respiratory phasicity and response to augmentation. Saphenofemoral Junction: No evidence of thrombus. Normal compressibility and flow on color Doppler imaging. Profunda Femoral Vein: No  evidence of thrombus. Normal compressibility and flow on color Doppler imaging. Femoral Vein: No evidence of thrombus. Normal compressibility, respiratory phasicity and response to augmentation. Popliteal Vein: No evidence of thrombus. Normal  compressibility, respiratory phasicity and response to augmentation. Calf Veins: No evidence of thrombus. Normal compressibility and flow on color Doppler imaging. Superficial Great Saphenous Vein: No evidence of thrombus. Normal compressibility. Venous Reflux:  None. Other Findings: Note is made of an approximately 3.9 x 1.4 x 3.1 cm fluid collection with the left popliteal fossa compatible with a Baker's cyst. IMPRESSION: 1. No evidence of DVT within either lower extremity. 2. Incidental note made of approximately 3.9 cm left-sided Baker's cyst. Electronically Signed   By: Simonne ComeJohn  Watts M.D.   On: 03/31/2020 13:53   DG CHEST PORT 1 VIEW  Result Date: 03/27/2020 CLINICAL DATA:  Acute respiratory failure, COVID-19 positivity EXAM: PORTABLE CHEST 1 VIEW COMPARISON:  03/06/2020 FINDINGS: Cardiac shadow is stable. Mild airspace opacities are noted bilaterally consistent with the given clinical history but improved when compared with the prior exam. No sizable effusion is seen. No bony abnormality is noted. IMPRESSION: Improved bilateral infiltrates consistent with the given clinical history. Electronically Signed   By: Alcide CleverMark  Lukens M.D.   On: 03/27/2020 20:53   DG Chest Portable 1 View  Result Date: 03/21/2020 CLINICAL DATA:  Weakness, shortness of breath and cough. EXAM: PORTABLE CHEST 1 VIEW COMPARISON:  March 09, 2018 FINDINGS: Moderate severity bilateral multifocal infiltrates are seen. There is no evidence of a pleural effusion or pneumothorax. The heart size and mediastinal contours are within normal limits. The visualized skeletal structures are unremarkable. IMPRESSION: Moderate severity bilateral multifocal infiltrates. Electronically Signed   By: Aram Candelahaddeus   Houston M.D.   On: 03/13/2020 18:33   US EKG SITE RITE  Result Date: 03/23/2020 If Site Rite image not attached, placement could not be confirmed due to current cardiac rhythm.    CBC Recent Labs  Lab 03/25/20 0436  WBC 9.3  HGB 13.2  HCT 39.1  PLT 186  MCV 84.4  MCH 28.5  MCHC 33.8  RDW 13.1  LYMPHSABS 1.0  MONOABS 0.6  EOSABS 0.0  BASOSABS 0.0    Chemistries  Recent Labs  Lab 03/25/20 0436 03/25/20 0436 03/27/20 0533 03/28/20 0420 03/29/20 0506 03/29/20 2128 03/30/20 0459  NA 137   < > 134* 134* 131* 129* 132*  K 4.1   < > 3.5 4.0 4.3 4.7 4.3  CL 103   < > 96* 96* 93* 93* 96*  CO2 25   < > 28 28 26 25 27   GLUCOSE 121*   < > 71 162* 208* 420* 266*  BUN 25*   < > 30* 34* 29* 30* 24*  CREATININE 0.66   < > 0.79 0.85 0.88 0.91 0.75  CALCIUM 8.3*   < > 8.2* 8.3* 8.3* 7.9* 8.0*  MG 1.8  --   --   --   --   --   --   AST 40  --  58* 41 46*  --  44*  ALT 24  --  34 36 41  --  44  ALKPHOS 144*  --  150* 168* 193*  --  191*  BILITOT 0.3  --  0.7 0.7 0.6  --  0.5   < > = values in this interval not displayed.    No results for input(s): CHOL, HDL, LDLCALC, TRIG, CHOLHDL, LDLDIRECT in the last 72 hours.  Lab Results  Component Value Date   HGBA1C 10.3 (H) 03/12/2020   ------------------------------------------------------------------------------------------------------------------ No results for input(s): TSH, T4TOTAL, T3FREE, THYROIDAB in the last 72 hours.  Invalid input(s): FREET3 ------------------------------------------------------------------------------------------------------------------ Recent Labs    03/30/20  2633 03/31/20 0330  FERRITIN 1,789* 1,883*    Coagulation profile No results for input(s): INR, PROTIME in the last 168 hours.  Recent Labs    03/30/20 0459 03/31/20 0330  DDIMER 2.30* 2.73*    Cardiac Enzymes No results for input(s): CKMB, TROPONINI, MYOGLOBIN in the last 168 hours.  Invalid input(s):  CK ------------------------------------------------------------------------------------------------------------------ No results found for: BNP   Shon Hale M.D on 03/31/2020 at 4:09 PM  Go to [email or web address] - for contact info  [facility] - Office  [phone]

## 2020-04-01 DIAGNOSIS — J1282 Pneumonia due to Coronavirus disease 2019: Secondary | ICD-10-CM | POA: Diagnosis not present

## 2020-04-01 DIAGNOSIS — U071 COVID-19: Secondary | ICD-10-CM | POA: Diagnosis not present

## 2020-04-01 LAB — GLUCOSE, CAPILLARY
Glucose-Capillary: 126 mg/dL — ABNORMAL HIGH (ref 70–99)
Glucose-Capillary: 189 mg/dL — ABNORMAL HIGH (ref 70–99)
Glucose-Capillary: 309 mg/dL — ABNORMAL HIGH (ref 70–99)
Glucose-Capillary: 415 mg/dL — ABNORMAL HIGH (ref 70–99)
Glucose-Capillary: 349 mg/dL — ABNORMAL HIGH (ref 70–99)

## 2020-04-01 LAB — FERRITIN: Ferritin: 1747 ng/mL — ABNORMAL HIGH (ref 11–307)

## 2020-04-01 LAB — C-REACTIVE PROTEIN: CRP: 17.2 mg/dL — ABNORMAL HIGH (ref <1.0)

## 2020-04-01 LAB — D-DIMER, QUANTITATIVE: D-Dimer, Quant: 5.73 ug/mL-FEU — ABNORMAL HIGH (ref 0.00–0.50)

## 2020-04-01 NOTE — Progress Notes (Signed)
Will recheck Blood sugar in 1 hour . Removed food from patient as she was found to be constantly eating high carb foods that were near her.

## 2020-04-01 NOTE — Progress Notes (Signed)
Recheck of CBG was within treatment scale. Insulin administered as ordered Snacks/Carbs are away from patient Patient re-educated on risks of high carb amounts

## 2020-04-01 NOTE — Progress Notes (Signed)
Patient Demographics:    [name], is a 64 y.o. female, DOB - [birth year], ZDG:[id number]  Admit date - 03/04/2020   Admitting Physician Frankey Shown, DO  Outpatient Primary MD for the patient is Tina Grippe, MD  LOS - 12  No chief complaint on file.       Subjective:    [name] continues to feel short of breath overall.   04/01/20-- Still requiring heated high flow AND nonrebreather mask.  -Patient with significant desaturations with coughing spells -Hypoxia and dyspnea persist-Currently requiring 80% FiO2 with 35 L high flow in addition to a nonrebreather bag  -I called patient's son from the phone in patient's room - spoke with patient's son--- and patient's son was able to speak with patient as well  - unfortunately patient continues to pull the mask off from time to time and she would desat very rapidly when she pulls her mask off--- patient's son tried to encourage her to keep the mask    Assessment  & Plan :    Principal Problem:   Pneumonia due to COVID-19 virus Active Problems:   Acute respiratory failure with hypoxia (HCC)   Severe sepsis (HCC)   Leukopenia   Thrombocytopenia (HCC)   Hyperglycemia due to diabetes mellitus (HCC)   Essential hypertension   Type 2 diabetes mellitus with hyperlipidemia (HCC)   Obesity (BMI 30-39.9)   Tobacco abuse   Lactic acidosis  Brief Summary:-  64 y.o. female with medical history significant for hypertension, type 2 DM, hyperlipidemia, obesity and tobacco abuse admitted on 03/14/2020 with acute hypoxic respiratory failure secondary to Covid Pneumonia -Patient is Not vaccinated against COVID-19 infection --Severe Hypoxia Persist and still requiring HFNC AND NRB-   A/p 1)Acute hypoxic respiratory failure secondary to COVID-19 infection/Pneumonia--- The treatment plan and use of medications  for treatment of COVID-19 infection and  possible side effects were discussed with patient/family -----Patient/Family verbalizes understanding and agrees to treatment protocols --Patient still requiring high flow oxygen  04/01/20---Hypoxia and dyspnea persist -Currently requiring 80% FiO2 with 35 L high flow in addition to a nonrebreather bag - unfortunately patient continues to pull the mask off from time to time and she would desat very rapidly when she pulls her mask off --Supplemental oxygen to keep O2 sats above 93% -Follow serial chest x-rays and ABGs as indicated --- Encourage prone positioning for More than 16 hours/day in increments of 2 to 3 hours at a time if able to tolerate --Attempt to maintain euvolemic state --Zinc and vitamin C as ordered -Albuterol inhaler as needed -Accu-Cheks/fingersticks while on high-dose steroids -PPI while on high-dose steroids -Enhanced dosage of anticoagulant for DVT prophylaxis given hypercoagulable state with COVID-19 infections -Continue IV Steroids started on 03/14/2020 -She has completed a course of remdesivir -Given persistent severe hypoxia and inflammatory markers-baricitinib started on 03/21/2020  -Repeat chest x-ray from 03/27/2020 consistent with Covid pneumonia which appears to have improved radiologically -Given persistent severe hypoxemia will check CTA chest to rule out PE--- patient too unstable to go to CT scanner at this time due to persistent hypoxemia -Lower extremity venous Dopplers negative for DVT  COVID-19 Labs  Recent Labs    03/30/20 0459 03/31/20 0330 04/01/20 0607  DDIMER 2.30* 2.73* 5.73*  FERRITIN  1,789* 1,883* 1,747*  CRP 8.3* 11.3* 17.2*    Lab Results  Component Value Date   SARSCOV2NAA POSITIVE (A) 02/28/2020   SARSCOV2NAA NEGATIVE 02/15/2019   Actemra/Baricitinib:- The patient has hypoxia and is high-risk for intubation, but expected to survive >48 hours and has good baseline functional status.  She is not known to be on immunomodulators,  anti-rejection medications, or cancer chemotherapy, has no history of TB or latent TB, and no history of diverticulitis or intestinal perforation.  Platelets are >50K, ANC is >500, and ALT/AST are below 5x ULN with no known hepatitis B infection. Baricitinib is being used under EUA by the FDA. The patient has no ESRD or AKI, known history of TB, severe neutropenia (ANC <500) or lymphopenia, or severe LFT elevations. They are not on DMARDs or probenecid, and are not pregnant. The option to use/refuse baricitinib treatment under FDA authorization (not approval), the significant known and potential risks and benefits, the extent to which these are unknown, and information regarding all available alternatives were discussed in detail. Specifically the risk of VTE and secondary infections were discussed in detail with the patient and/or HCPOA. They consent to proceed with treatment. - Continue Baricitinib (started 03/21/20), c/n 14 days or until hospital discharge. Monitor Cr, LFTs, differential. Keep on VTE ppx  2) COPD/tobacco abuse--- nicotine patch as ordered, steroids and bronchodilators as above #1  3)HLD-stopped Lipitor for now, watch LFTs closely with baricitinib use  4)HTN--currently on losartan, may use as needed IV labetalol for elevated BP  5)Leukopenia and Thrombocytopenia--- resolved on CBC from 03/22/20  6)Class II Obesity-- this increases overall morbidity and mortality -after resolution of acute hypoxic respiratory failure and Covid infection patient will be advised to adhere to low calorie diet, portion control and increase physical activity discussed with patient -Body mass index is 36.25 kg/m.  7) hyperglycemia--- patient had episodes of hypoglycemia,  --Insulin dosages have been adjusted  -- Use Novolog/Humalog Sliding scale insulin with Accu-Cheks/Fingersticks as ordered -Insulin regimen adjusted due to persistent hyperglycemia  8)Social/Ethics----- RN [name] at  bedside--- we had extensive conversation with patient regarding goals of care and advanced directives -Patient repeatedly states myself and the RN at bedside that she wants to be a DNR/DNI----she understands that given her worsening respiratory status she may die- --I called and updated patient's son ([name] at [phone] on patient's condition and patient's decision for DNR/DNI status  Disposition/Need for in-Hospital Stay- patient unable to be discharged at this time due to --severe hypoxic respiratory failure in the setting of COVID-19 pneumonia requiring IV steroids and supplemental oxygen  Status is: Inpatient  Remains inpatient appropriate because:severe hypoxic respiratory failure in the setting of COVID-19 pneumonia requiring  IV steroids and supplemental oxygen  Disposition: The patient is from: Home              Anticipated d/c is to: Home              Anticipated d/c date is: > 3 days              Patient currently is not medically stable to d/c. Barriers: Not Clinically Stable- severe hypoxic respiratory failure in the setting of COVID-19 pneumonia requiring  IV steroids and supplemental oxygen  Code Status :  DNR  Family Communication:    (patient is alert, awake and coherent)  Discussed with pt's son [name] at [phone] --spoke with  son on 04/01/2020 Consults  :  na  DVT Prophylaxis  :  Lovenox -  - SCDs    Lab Results  Component Value Date   PLT 186 03/25/2020    Inpatient Medications  Scheduled Meds: . albuterol  2 puff Inhalation TID  . amLODipine  10 mg Oral Daily  . vitamin C  500 mg Oral Daily  . baricitinib  4 mg Oral Daily  . Chlorhexidine Gluconate Cloth  6 each Topical Daily  . enoxaparin (LOVENOX) injection  50 mg Subcutaneous Q24H  . feeding supplement (GLUCERNA SHAKE)  237 mL Oral BID BM  . gabapentin  300 mg Oral TID  . guaiFENesin  600 mg Oral BID  . insulin aspart  0-20 Units Subcutaneous TID WC  . insulin aspart   0-5 Units Subcutaneous QHS  . insulin aspart  4 Units Subcutaneous TID WC  . insulin glargine  28 Units Subcutaneous BID  . mouth rinse  15 mL Mouth Rinse BID  . methylPREDNISolone (SOLU-MEDROL) injection  60 mg Intravenous Q12H  . multivitamin with minerals  1 tablet Oral Daily  . nicotine  21 mg Transdermal Daily  . pantoprazole  40 mg Oral Daily  . polyethylene glycol  17 g Oral Daily  . senna-docusate  2 tablet Oral QHS  . sodium chloride flush  10-40 mL Intracatheter Q12H  . zinc sulfate  220 mg Oral Daily   Continuous Infusions:  PRN Meds:.acetaminophen, ALPRAZolam, chlorpheniramine-HYDROcodone, guaiFENesin-dextromethorphan, iohexol, labetalol, melatonin, ondansetron **OR** ondansetron (ZOFRAN) IV, phenol, sodium chloride, sodium chloride flush    Anti-infectives (From admission, onward)   Start     Dose/Rate Route Frequency Ordered Stop   03/21/20 1000  remdesivir 100 mg in sodium chloride 0.9 % 100 mL IVPB  Status:  Discontinued       "Followed by" Linked Group Details   100 mg 200 mL/hr over 30 Minutes Intravenous Daily Apr 19, 2020 2013 04-19-20 2020   03/21/20 1000  remdesivir 100 mg in sodium chloride 0.9 % 100 mL IVPB        100 mg 200 mL/hr over 30 Minutes Intravenous Daily April 19, 2020 2025 03/24/20 0857   Apr 19, 2020 2030  remdesivir 100 mg in sodium chloride 0.9 % 100 mL IVPB  Status:  Discontinued       "And" Linked Group Details   100 mg 200 mL/hr over 30 Minutes Intravenous Daily 04-19-20 2016 19-Apr-2020 2019   04/19/20 2030  remdesivir 100 mg in sodium chloride 0.9 % 100 mL IVPB  Status:  Discontinued       "And" Linked Group Details   100 mg 200 mL/hr over 30 Minutes Intravenous  Once April 19, 2020 2016 Apr 19, 2020 2019   April 19, 2020 2030  remdesivir 100 mg in sodium chloride 0.9 % 100 mL IVPB  Status:  Discontinued        100 mg 200 mL/hr over 30 Minutes Intravenous Daily 04-19-20 2017 2020-04-19 2019   04-19-20 2030  remdesivir 100 mg in sodium chloride 0.9 % 100 mL IVPB  Status:   Discontinued        100 mg 200 mL/hr over 30 Minutes Intravenous  Once Apr 19, 2020 2021 April 19, 2020 2025   04/19/2020 2030  remdesivir 100 mg in sodium chloride 0.9 % 100 mL IVPB  Status:  Discontinued       "And" Linked Group Details   100 mg 200 mL/hr over 30 Minutes Intravenous Daily April 19, 2020 2023 03/21/20 1116   2020/04/19 2030  remdesivir 100 mg in sodium chloride 0.9 % 100 mL IVPB       "And" Linked Group Details   100  mg 200 mL/hr over 30 Minutes Intravenous  Once 08-Apr-2020 2023 08-Apr-2020 2225   2020-04-08 2013  remdesivir 200 mg in sodium chloride 0.9% 250 mL IVPB  Status:  Discontinued       "Followed by" Linked Group Details   200 mg 580 mL/hr over 30 Minutes Intravenous Once 08-Apr-2020 2013 04-08-2020 2020        Objective:   Vitals:   04/01/20 0400 04/01/20 0500 04/01/20 0600 04/01/20 0844  BP: (!) 164/71     Pulse: 82 91 94   Resp: (!) 23 (!) 30 (!) 33   Temp: 98.7 F (37.1 C)     TempSrc: Oral     SpO2: (!) 89% (!) 64% (!) 88% 91%  Weight:   89.9 kg   Height:        Wt Readings from Last 3 Encounters:  04/01/20 89.9 kg  09/14/19 94.1 kg  05/05/19 91.6 kg     Intake/Output Summary (Last 24 hours) at 04/01/2020 1418 Last data filed at 03/31/2020 1855 Gross per 24 hour  Intake --  Output 1101 ml  Net -1101 ml    Physical Exam General exam: Alert, awake, oriented x 3 Nose- NRB + HFNC  Respiratory system: Diminished breath sounds, few scattered rhonchi cardiovascular system:RRR. No murmurs, rubs, gallops. Gastrointestinal system: Abdomen is soft, NT, ND, +BS   Central nervous system: Alert and oriented. No focal neurological deficits. Extremities: No C/C/E, +pedal pulses Skin: No rashes, lesions or ulcers Psychiatry: Judgement and insight appear normal. Mood & affect appropriate.     Data Review:   Micro Results Recent Results (from the past 240 hour(s))  MRSA PCR Screening     Status: None   Collection Time: 03/22/20 10:30 PM   Specimen: Nasal Mucosa;  Nasopharyngeal  Result Value Ref Range Status   MRSA by PCR NEGATIVE NEGATIVE Final    Comment:        The GeneXpert MRSA Assay (FDA approved for NASAL specimens only), is one component of a comprehensive MRSA colonization surveillance program. It is not intended to diagnose MRSA infection nor to guide or monitor treatment for MRSA infections. Performed at [facility], [address] [place], [place] [address]     Radiology Reports US Venous Img Lower Bilateral (DVT)  Result Date: 03/31/2020 CLINICAL DATA:  History of COVID-19 infection now with hypoxemia. Evaluate for DVT. EXAM: BILATERAL LOWER EXTREMITY VENOUS DOPPLER ULTRASOUND TECHNIQUE: Gray-scale sonography with graded compression, as well as color Doppler and duplex ultrasound were performed to evaluate the lower extremity deep venous systems from the level of the common femoral vein and including the common femoral, femoral, profunda femoral, popliteal and calf veins including the posterior tibial, peroneal and gastrocnemius veins when visible. The superficial great saphenous vein was also interrogated. Spectral Doppler was utilized to evaluate flow at rest and with distal augmentation maneuvers in the common femoral, femoral and popliteal veins. COMPARISON:  None. FINDINGS: RIGHT LOWER EXTREMITY Common Femoral Vein: No evidence of thrombus. Normal compressibility, respiratory phasicity and response to augmentation. Saphenofemoral Junction: No evidence of thrombus. Normal compressibility and flow on color Doppler imaging. Profunda Femoral Vein: No evidence of thrombus. Normal compressibility and flow on color Doppler imaging. Femoral Vein: No evidence of thrombus. Normal compressibility, respiratory phasicity and response to augmentation. Popliteal Vein: No evidence of thrombus. Normal compressibility, respiratory phasicity and response to augmentation. Calf Veins: No evidence of thrombus. Normal compressibility and flow on color Doppler  imaging. Superficial Great Saphenous Vein: No evidence of thrombus.  Normal compressibility. Venous Reflux:  None. Other Findings:  None. LEFT LOWER EXTREMITY Common Femoral Vein: No evidence of thrombus. Normal compressibility, respiratory phasicity and response to augmentation. Saphenofemoral Junction: No evidence of thrombus. Normal compressibility and flow on color Doppler imaging. Profunda Femoral Vein: No evidence of thrombus. Normal compressibility and flow on color Doppler imaging. Femoral Vein: No evidence of thrombus. Normal compressibility, respiratory phasicity and response to augmentation. Popliteal Vein: No evidence of thrombus. Normal compressibility, respiratory phasicity and response to augmentation. Calf Veins: No evidence of thrombus. Normal compressibility and flow on color Doppler imaging. Superficial Great Saphenous Vein: No evidence of thrombus. Normal compressibility. Venous Reflux:  None. Other Findings: Note is made of an approximately 3.9 x 1.4 x 3.1 cm fluid collection with the left popliteal fossa compatible with a Baker's cyst. IMPRESSION: 1. No evidence of DVT within either lower extremity. 2. Incidental note made of approximately 3.9 cm left-sided Baker's cyst. Electronically Signed   By: Simonne ComeJohn  Watts M.D.   On: 03/31/2020 13:53   DG CHEST PORT 1 VIEW  Result Date: 03/27/2020 CLINICAL DATA:  Acute respiratory failure, COVID-19 positivity EXAM: PORTABLE CHEST 1 VIEW COMPARISON:  06/15/20 FINDINGS: Cardiac shadow is stable. Mild airspace opacities are noted bilaterally consistent with the given clinical history but improved when compared with the prior exam. No sizable effusion is seen. No bony abnormality is noted. IMPRESSION: Improved bilateral infiltrates consistent with the given clinical history. Electronically Signed   By: Alcide CleverMark  Lukens M.D.   On: 03/27/2020 20:53   DG Chest Portable 1 View  Result Date: 07-14-2019 CLINICAL DATA:  Weakness, shortness of breath and cough.  EXAM: PORTABLE CHEST 1 VIEW COMPARISON:  March 09, 2018 FINDINGS: Moderate severity bilateral multifocal infiltrates are seen. There is no evidence of a pleural effusion or pneumothorax. The heart size and mediastinal contours are within normal limits. The visualized skeletal structures are unremarkable. IMPRESSION: Moderate severity bilateral multifocal infiltrates. Electronically Signed   By: Aram Candelahaddeus  Houston M.D.   On: 06/15/20 18:33   US EKG SITE RITE  Result Date: 03/23/2020 If Site Rite image not attached, placement could not be confirmed due to current cardiac rhythm.    CBC No results for input(s): WBC, HGB, HCT, PLT, MCV, MCH, MCHC, RDW, LYMPHSABS, MONOABS, EOSABS, BASOSABS, BANDABS in the last 168 hours.  Invalid input(s): NEUTRABS, BANDSABD  Chemistries  Recent Labs  Lab 03/27/20 0533 03/28/20 0420 03/29/20 0506 03/29/20 2128 03/30/20 0459  NA 134* 134* 131* 129* 132*  K 3.5 4.0 4.3 4.7 4.3  CL 96* 96* 93* 93* 96*  CO2 28 28 26 25 27   GLUCOSE 71 162* 208* 420* 266*  BUN 30* 34* 29* 30* 24*  CREATININE 0.79 0.85 0.88 0.91 0.75  CALCIUM 8.2* 8.3* 8.3* 7.9* 8.0*  AST 58* 41 46*  --  44*  ALT 34 36 41  --  44  ALKPHOS 150* 168* 193*  --  191*  BILITOT 0.7 0.7 0.6  --  0.5    No results for input(s): CHOL, HDL, LDLCALC, TRIG, CHOLHDL, LDLDIRECT in the last 72 hours.  Lab Results  Component Value Date   HGBA1C 10.3 (H) 06/15/20   ------------------------------------------------------------------------------------------------------------------ No results for input(s): TSH, T4TOTAL, T3FREE, THYROIDAB in the last 72 hours.  Invalid input(s): FREET3 ------------------------------------------------------------------------------------------------------------------ Recent Labs    03/31/20 0330 04/01/20 0607  FERRITIN 1,883* 1,747*    Coagulation profile No results for input(s): INR, PROTIME in the last 168 hours.  Recent Labs    03/31/20 0330  04/01/20 0607  DDIMER 2.73* 5.73*    Cardiac Enzymes No results for input(s): CKMB, TROPONINI, MYOGLOBIN in the last 168 hours.  Invalid input(s): CK ------------------------------------------------------------------------------------------------------------------ No results found for: BNP   Shon Hale M.D on 04/01/2020 at 2:18 PM  Go to [email or web address] - for contact info  [facility] - Office  [phone]

## 2020-04-01 NOTE — Progress Notes (Signed)
Constantly removes oxygen causing desaturation in attempts to continually have staff enter room and then requires nothing else when staff enters room. Upon reapplying O2 device patient will again remove O2 within a few minutes.  The risks of removing O2 have been explained to her numerous times by multiple staff members.

## 2020-04-01 NOTE — Progress Notes (Signed)
Patient has rung the call bell constantly throughout the night (+/- every 5-15 minutes)  for numerous non-medically related things/items. She appears in, and states no distress.

## 2020-04-02 ENCOUNTER — Inpatient Hospital Stay (HOSPITAL_COMMUNITY): Payer: Medicare Other

## 2020-04-02 DIAGNOSIS — J1282 Pneumonia due to Coronavirus disease 2019: Secondary | ICD-10-CM | POA: Diagnosis not present

## 2020-04-02 DIAGNOSIS — U071 COVID-19: Secondary | ICD-10-CM | POA: Diagnosis not present

## 2020-04-02 LAB — D-DIMER, QUANTITATIVE: D-Dimer, Quant: 5.87 ug/mL-FEU — ABNORMAL HIGH (ref 0.00–0.50)

## 2020-04-02 LAB — GLUCOSE, CAPILLARY
Glucose-Capillary: 81 mg/dL (ref 70–99)
Glucose-Capillary: 41 mg/dL — CL (ref 70–99)
Glucose-Capillary: 170 mg/dL — ABNORMAL HIGH (ref 70–99)
Glucose-Capillary: 136 mg/dL — ABNORMAL HIGH (ref 70–99)

## 2020-04-02 LAB — C-REACTIVE PROTEIN: CRP: 21 mg/dL — ABNORMAL HIGH (ref <1.0)

## 2020-04-02 LAB — FERRITIN: Ferritin: 1706 ng/mL — ABNORMAL HIGH (ref 11–307)

## 2020-04-02 MED ORDER — DEXTROSE-NACL 5-0.45 % IV SOLN: INTRAVENOUS | Status: DC

## 2020-04-02 MED ORDER — ALBUTEROL SULFATE (2.5 MG/3ML) 0.083% IN NEBU
2.5000 mg | INHALATION_SOLUTION | RESPIRATORY_TRACT | Status: DC | PRN
  Filled 2020-04-02: qty 3

## 2020-04-02 MED ORDER — DEXTROSE 10 % IV SOLN: INTRAVENOUS | Status: DC

## 2020-04-02 MED ORDER — FAMOTIDINE IN NACL 20-0.9 MG/50ML-% IV SOLN
20.0000 mg | INTRAVENOUS | Status: DC
  Administered 2020-04-02 – 2020-04-04 (×3): 20 mg via INTRAVENOUS
  Filled 2020-04-02 (×3): qty 50

## 2020-04-02 MED ORDER — DEXTROSE 50 % IV SOLN
INTRAVENOUS | Status: AC
  Administered 2020-04-02: 50 mL
  Filled 2020-04-02: qty 50

## 2020-04-02 MED ORDER — FENTANYL CITRATE (PF) 100 MCG/2ML IJ SOLN
25.0000 ug | Freq: Once | INTRAMUSCULAR | Status: AC
  Administered 2020-04-02: 25 ug via INTRAVENOUS
  Filled 2020-04-02: qty 2

## 2020-04-02 NOTE — Progress Notes (Signed)
Patient Demographics:    [name], is a 64 y.o. female, DOB - [birth year], NFA:[id number]  Admit date - March 26, 2020   Admitting Physician Frankey Shown, DO  Outpatient Primary MD for the patient is Pearson Grippe, MD  LOS - 13  No chief complaint on file.       Subjective:    [name] continues to feel short of breath overall.   04/02/20--  -Patient with very very significant and very very prolonged  desaturations patient was in severe respiratory distress, O2 sats 2030s, she was tachycardic, respiratory effort was weak, patient was cyanotic/blue --- patient required Ambu bag bagging for quite a while  -Has been BiPAP and heated high flow dependent- in addition to a nonrebreather bag  -I called patient's son from the phone in patient's room - spoke with patient's son---   -Patient's son  came to visit patient with clergy/Champlain  -Patient son who lives here in discussion with his brother who lives in [place] regarding possible transition to full comfort care  =-Patient is having episodes of hypoglycemia requiring IV dextrose   Assessment  & Plan :    Principal Problem:   Pneumonia due to COVID-19 virus Active Problems:   Acute respiratory failure with hypoxia (HCC)   Severe sepsis (HCC)   Leukopenia   Thrombocytopenia (HCC)   Hyperglycemia due to diabetes mellitus (HCC)   Essential hypertension   Type 2 diabetes mellitus with hyperlipidemia (HCC)   Obesity (BMI 30-39.9)   Tobacco abuse   Lactic acidosis  Brief Summary:-  64 y.o. female with medical history significant for hypertension, type 2 DM, hyperlipidemia, obesity and tobacco abuse admitted on 03-26-20 with acute hypoxic respiratory failure secondary to Covid Pneumonia -Patient is Not vaccinated against COVID-19 infection --Severe Hypoxia Persist and still requiring HFNC AND NRB-    A/p 1)Acute hypoxic respiratory  failure secondary to COVID-19 infection/Pneumonia--- The treatment plan and use of medications  for treatment of COVID-19 infection and possible side effects were discussed with patient/family -----Patient/Family verbalizes understanding and agrees to treatment protocols --Patient still requiring high flow oxygen  04/02/20---Hypoxia and dyspnea much worse please see subjective section above --Attempt to maintain euvolemic state --Zinc and vitamin C as ordered -Albuterol inhaler as needed -Accu-Cheks/fingersticks while on high-dose steroids -PPI while on high-dose steroids -Enhanced dosage of anticoagulant for DVT prophylaxis given hypercoagulable state with COVID-19 infections -Continue IV Steroids started on 2020-03-26 -She has completed a course of remdesivir -Given persistent severe hypoxia and inflammatory markers-baricitinib started on 03/21/2020  -Repeat chest x-ray from 03/27/2020 consistent with Covid pneumonia which appears to have improved radiologically -Given persistent severe hypoxemia will check CTA chest to rule out PE--- patient too unstable to go to CT scanner at this time due to persistent hypoxemia -Lower extremity venous Dopplers negative for DVT  COVID-19 Labs  Recent Labs    03/31/20 0330 04/01/20 0607 04/02/20 0452 04/02/20 0453  DDIMER 2.73* 5.73*  --  5.87*  FERRITIN 1,883* 1,747* 1,706*  --   CRP 11.3* 17.2* 21.0*  --     Lab Results  Component Value Date   SARSCOV2NAA POSITIVE (A) 2020/03/26   SARSCOV2NAA NEGATIVE 02/15/2019   2) COPD/tobacco abuse--- nicotine patch as ordered, steroids and bronchodilators as above #1  3)HLD-stopped Lipitor for now, watch LFTs closely with baricitinib use  4)HTN--currently on losartan, may use as needed IV labetalol for elevated BP  5)Leukopenia and Thrombocytopenia--- resolved on CBC from 03/22/20  6)Class II Obesity-- this increases overall morbidity and mortality -Body mass index is 35.89 kg/m.  7)  hypoglycemia--- patient had episodes of hypoglycemia due to lack of oral intake in the setting of prolonged BiPAP use, insulin therapy has been discontinued -IV dextrose started - 8)Social/Ethics----- RN [name] at bedside--- we had extensive conversation with patient regarding goals of care and advanced directives -Patient repeatedly states myself and the RN at bedside that she wants to be a DNR/DNI----she understands that given her worsening respiratory status she may die- --I called and updated patient's son (Mr. Tashema Tiller) at [phone] on patient's condition and patient's decision for DNR/DNI status -04/02/20- Patient's son  came to visit patient with clergy/Champlain -Patient son who lives here in discussion with his brother who lives in [place] regarding possible transition to full comfort care   Disposition/Need for in-Hospital Stay- patient unable to be discharged at this time due to --severe hypoxic respiratory failure in the setting of COVID-19 pneumonia requiring IV steroids and supplemental oxygen  Status is: Inpatient  Remains inpatient appropriate because:severe hypoxic respiratory failure in the setting of COVID-19 pneumonia requiring  IV steroids and supplemental oxygen  Disposition: The patient is from: Home              Anticipated d/c is to: Home              Anticipated d/c date is: > 3 days              Patient currently is not medically stable to d/c. Barriers: Not Clinically Stable- severe hypoxic respiratory failure in the setting of COVID-19 pneumonia requiring  IV steroids and supplemental oxygen  Code Status :  DNR  Family Communication:   Discussed with pt's son Mr. Verneda Hollopeter at [phone] --spoke with  son on 04/02/2020 Consults  :  na  DVT Prophylaxis  :  Lovenox -  - SCDs    Lab Results  Component Value Date   PLT 186 03/25/2020    Inpatient Medications  Scheduled Meds: . albuterol  2 puff Inhalation TID  . amLODipine  10  mg Oral Daily  . vitamin C  500 mg Oral Daily  . baricitinib  4 mg Oral Daily  . Chlorhexidine Gluconate Cloth  6 each Topical Daily  . enoxaparin (LOVENOX) injection  50 mg Subcutaneous Q24H  . feeding supplement (GLUCERNA SHAKE)  237 mL Oral BID BM  . gabapentin  300 mg Oral TID  . guaiFENesin  600 mg Oral BID  . insulin aspart  0-20 Units Subcutaneous TID WC  . insulin aspart  0-5 Units Subcutaneous QHS  . insulin aspart  4 Units Subcutaneous TID WC  . insulin glargine  28 Units Subcutaneous BID  . mouth rinse  15 mL Mouth Rinse BID  . methylPREDNISolone (SOLU-MEDROL) injection  60 mg Intravenous Q12H  . multivitamin with minerals  1 tablet Oral Daily  . nicotine  21 mg Transdermal Daily  . pantoprazole  40 mg Oral Daily  . polyethylene glycol  17 g Oral Daily  . senna-docusate  2 tablet Oral QHS  . sodium chloride flush  10-40 mL Intracatheter Q12H  . zinc sulfate  220 mg Oral Daily   Continuous Infusions: . dextrose 5 % and 0.45% NaCl 75 mL/hr at 04/02/20  1154   PRN Meds:.acetaminophen, ALPRAZolam, chlorpheniramine-HYDROcodone, guaiFENesin-dextromethorphan, iohexol, labetalol, melatonin, ondansetron **OR** ondansetron (ZOFRAN) IV, phenol, sodium chloride, sodium chloride flush    Anti-infectives (From admission, onward)   Start     Dose/Rate Route Frequency Ordered Stop   03/21/20 1000  remdesivir 100 mg in sodium chloride 0.9 % 100 mL IVPB  Status:  Discontinued       "Followed by" Linked Group Details   100 mg 200 mL/hr over 30 Minutes Intravenous Daily 03/06/2020 2013 03/21/2020 2020   03/21/20 1000  remdesivir 100 mg in sodium chloride 0.9 % 100 mL IVPB        100 mg 200 mL/hr over 30 Minutes Intravenous Daily 02/28/2020 2025 03/24/20 0857   03/10/2020 2030  remdesivir 100 mg in sodium chloride 0.9 % 100 mL IVPB  Status:  Discontinued       "And" Linked Group Details   100 mg 200 mL/hr over 30 Minutes Intravenous Daily 02/28/2020 2016 03/17/2020 2019   03/03/2020 2030  remdesivir  100 mg in sodium chloride 0.9 % 100 mL IVPB  Status:  Discontinued       "And" Linked Group Details   100 mg 200 mL/hr over 30 Minutes Intravenous  Once 03/21/2020 2016 03/07/2020 2019   03/05/2020 2030  remdesivir 100 mg in sodium chloride 0.9 % 100 mL IVPB  Status:  Discontinued        100 mg 200 mL/hr over 30 Minutes Intravenous Daily 03/19/2020 2017 03/17/2020 2019   03/19/2020 2030  remdesivir 100 mg in sodium chloride 0.9 % 100 mL IVPB  Status:  Discontinued        100 mg 200 mL/hr over 30 Minutes Intravenous  Once 03/13/2020 2021 02/24/2020 2025   02/24/2020 2030  remdesivir 100 mg in sodium chloride 0.9 % 100 mL IVPB  Status:  Discontinued       "And" Linked Group Details   100 mg 200 mL/hr over 30 Minutes Intravenous Daily 03/10/2020 2023 03/21/20 1116   03/19/2020 2030  remdesivir 100 mg in sodium chloride 0.9 % 100 mL IVPB       "And" Linked Group Details   100 mg 200 mL/hr over 30 Minutes Intravenous  Once 03/03/2020 2023 02/23/2020 2225   03/21/2020 2013  remdesivir 200 mg in sodium chloride 0.9% 250 mL IVPB  Status:  Discontinued       "Followed by" Linked Group Details   200 mg 580 mL/hr over 30 Minutes Intravenous Once 03/09/2020 2013 03/19/2020 2020        Objective:   Vitals:   04/02/20 1420 04/02/20 1422 04/02/20 1500 04/02/20 1600  BP:  126/71 107/73 114/71  Pulse:  99 93 92  Resp:  (!) 21 (!) 21 20  Temp:      TempSrc:      SpO2: 97% 97% 98% 99%  Weight:      Height:        Wt Readings from Last 3 Encounters:  04/02/20 89 kg  09/14/19 94.1 kg  05/05/19 91.6 kg     Intake/Output Summary (Last 24 hours) at 04/02/2020 1753 Last data filed at 04/02/2020 1600 Gross per 24 hour  Intake 305.73 ml  Output 300 ml  Net 5.73 ml    Physical Exam General exam: Alert, awake, oriented x 3 Nose- NRB + HFNC /Bipap Respiratory system: Diminished breath sounds, few scattered rhonchi cardiovascular system:RRR. No murmurs, rubs, gallops. Gastrointestinal system: Abdomen is soft, NT, ND, +BS    Central nervous system: Alert  and oriented. No focal neurological deficits. Extremities: No C/C/E, +pedal pulses Skin: No rashes, lesions or ulcers Psychiatry: Judgement and insight appear normal. Mood & affect appropriate.     Data Review:   Micro Results No results found for this or any previous visit (from the past 240 hour(s)).  Radiology Reports US Venous Img Lower Bilateral (DVT)  Result Date: 03/31/2020 CLINICAL DATA:  History of COVID-19 infection now with hypoxemia. Evaluate for DVT. EXAM: BILATERAL LOWER EXTREMITY VENOUS DOPPLER ULTRASOUND TECHNIQUE: Gray-scale sonography with graded compression, as well as color Doppler and duplex ultrasound were performed to evaluate the lower extremity deep venous systems from the level of the common femoral vein and including the common femoral, femoral, profunda femoral, popliteal and calf veins including the posterior tibial, peroneal and gastrocnemius veins when visible. The superficial great saphenous vein was also interrogated. Spectral Doppler was utilized to evaluate flow at rest and with distal augmentation maneuvers in the common femoral, femoral and popliteal veins. COMPARISON:  None. FINDINGS: RIGHT LOWER EXTREMITY Common Femoral Vein: No evidence of thrombus. Normal compressibility, respiratory phasicity and response to augmentation. Saphenofemoral Junction: No evidence of thrombus. Normal compressibility and flow on color Doppler imaging. Profunda Femoral Vein: No evidence of thrombus. Normal compressibility and flow on color Doppler imaging. Femoral Vein: No evidence of thrombus. Normal compressibility, respiratory phasicity and response to augmentation. Popliteal Vein: No evidence of thrombus. Normal compressibility, respiratory phasicity and response to augmentation. Calf Veins: No evidence of thrombus. Normal compressibility and flow on color Doppler imaging. Superficial Great Saphenous Vein: No evidence of thrombus. Normal  compressibility. Venous Reflux:  None. Other Findings:  None. LEFT LOWER EXTREMITY Common Femoral Vein: No evidence of thrombus. Normal compressibility, respiratory phasicity and response to augmentation. Saphenofemoral Junction: No evidence of thrombus. Normal compressibility and flow on color Doppler imaging. Profunda Femoral Vein: No evidence of thrombus. Normal compressibility and flow on color Doppler imaging. Femoral Vein: No evidence of thrombus. Normal compressibility, respiratory phasicity and response to augmentation. Popliteal Vein: No evidence of thrombus. Normal compressibility, respiratory phasicity and response to augmentation. Calf Veins: No evidence of thrombus. Normal compressibility and flow on color Doppler imaging. Superficial Great Saphenous Vein: No evidence of thrombus. Normal compressibility. Venous Reflux:  None. Other Findings: Note is made of an approximately 3.9 x 1.4 x 3.1 cm fluid collection with the left popliteal fossa compatible with a Baker's cyst. IMPRESSION: 1. No evidence of DVT within either lower extremity. 2. Incidental note made of approximately 3.9 cm left-sided Baker's cyst. Electronically Signed   By: Simonne Come M.D.   On: 03/31/2020 13:53   DG CHEST PORT 1 VIEW  Result Date: 03/27/2020 CLINICAL DATA:  Acute respiratory failure, COVID-19 positivity EXAM: PORTABLE CHEST 1 VIEW COMPARISON:  03/16/2020 FINDINGS: Cardiac shadow is stable. Mild airspace opacities are noted bilaterally consistent with the given clinical history but improved when compared with the prior exam. No sizable effusion is seen. No bony abnormality is noted. IMPRESSION: Improved bilateral infiltrates consistent with the given clinical history. Electronically Signed   By: Alcide Clever M.D.   On: 03/27/2020 20:53   DG Chest Portable 1 View  Result Date: 02/26/2020 CLINICAL DATA:  Weakness, shortness of breath and cough. EXAM: PORTABLE CHEST 1 VIEW COMPARISON:  March 09, 2018 FINDINGS:  Moderate severity bilateral multifocal infiltrates are seen. There is no evidence of a pleural effusion or pneumothorax. The heart size and mediastinal contours are within normal limits. The visualized skeletal structures are unremarkable. IMPRESSION: Moderate severity bilateral multifocal  infiltrates. Electronically Signed   By: Aram Candela M.D.   On: 03/30/20 18:33   [place] EKG SITE RITE  Result Date: 03/23/2020 If Site Rite image not attached, placement could not be confirmed due to current cardiac rhythm.    CBC No results for input(s): WBC, HGB, HCT, PLT, MCV, MCH, MCHC, RDW, LYMPHSABS, MONOABS, EOSABS, BASOSABS, BANDABS in the last 168 hours.  Invalid input(s): NEUTRABS, BANDSABD  Chemistries  Recent Labs  Lab 03/27/20 0533 03/28/20 0420 03/29/20 0506 03/29/20 2128 03/30/20 0459  NA 134* 134* 131* 129* 132*  K 3.5 4.0 4.3 4.7 4.3  CL 96* 96* 93* 93* 96*  CO2 28 28 26 25 27   GLUCOSE 71 162* 208* 420* 266*  BUN 30* 34* 29* 30* 24*  CREATININE 0.79 0.85 0.88 0.91 0.75  CALCIUM 8.2* 8.3* 8.3* 7.9* 8.0*  AST 58* 41 46*  --  44*  ALT 34 36 41  --  44  ALKPHOS 150* 168* 193*  --  191*  BILITOT 0.7 0.7 0.6  --  0.5    No results for input(s): CHOL, HDL, LDLCALC, TRIG, CHOLHDL, LDLDIRECT in the last 72 hours.  Lab Results  Component Value Date   HGBA1C 10.3 (H) 03-30-2020   ------------------------------------------------------------------------------------------------------------------ No results for input(s): TSH, T4TOTAL, T3FREE, THYROIDAB in the last 72 hours.  Invalid input(s): FREET3 ------------------------------------------------------------------------------------------------------------------ Recent Labs    04/01/20 0607 04/02/20 0452  FERRITIN 1,747* 1,706*    Coagulation profile No results for input(s): INR, PROTIME in the last 168 hours.  Recent Labs    04/01/20 0607 04/02/20 0453  DDIMER 5.73* 5.87*   Cardiac Enzymes No results for  input(s): CKMB, TROPONINI, MYOGLOBIN in the last 168 hours.  Invalid input(s): CK ------------------------------------------------------------------------------------------------------------------ No results found for: BNP  06/02/20 M.D on 04/02/2020 at 5:53 PM  Go to [email or web address] - for contact info  [facility] - Office  [phone]

## 2020-04-02 NOTE — Progress Notes (Signed)
Chaplain engaged in initial visit with Minnetta's son, [name], over the phone.  [name] is coming from [place] and does not know what time he will be here today.  Chaplain offered support via phone and let [name] know that he is not alone.  [name] expressed that his brother is out of state right now.  [name] seems to be feeling the pressure of making decisions and seeing his mom by himself.  He also noted that he lost his father suddenly not long ago.  Chaplain could hear [name] start to become emotional.   Lunette Stands assesses that he is feeling overwhelmed and still in the process of grieving his deceased father while trying to be there for his mother. [name] stated that he doesn't know what to do.  Chaplain again let [name] know that he is not alone and that she is here for him.  Chaplain assesses that it is important for him to have someone to lean on during this time. Chaplain offered the ministries of support and listening.   Chaplain will follow-up.      04/02/20 1100  Clinical Encounter Type  Visited With Family  Visit Type Initial  Spiritual Encounters  Spiritual Needs Emotional;Grief support  Stress Factors  Patient Stress Factors Major life changes

## 2020-04-02 NOTE — Progress Notes (Signed)
Patient was found to be on heated high flow nasal cannula and BIPAP at the same time.  Rt removed heated high flow and placed patient just on BIPAP 18/9 100%.  Patient is tolerating at this time with an O2 sat of 93%. Patient is very anxious and RN attempted to give patient po meds but O2 sats dropped to low 30's.  Talked with RN and told her that I didn't think anything by mouth should be done due to patients current respiratory state.   She is going to reach out to the doctor about getting meds switched to IV.  RT will continue to monitor.

## 2020-04-03 ENCOUNTER — Encounter (HOSPITAL_COMMUNITY): Payer: Self-pay | Admitting: Internal Medicine

## 2020-04-03 DIAGNOSIS — Z515 Encounter for palliative care: Secondary | ICD-10-CM

## 2020-04-03 DIAGNOSIS — U071 COVID-19: Principal | ICD-10-CM

## 2020-04-03 DIAGNOSIS — Z7189 Other specified counseling: Secondary | ICD-10-CM

## 2020-04-03 DIAGNOSIS — J9601 Acute respiratory failure with hypoxia: Secondary | ICD-10-CM

## 2020-04-03 LAB — GLUCOSE, CAPILLARY
Glucose-Capillary: 222 mg/dL — ABNORMAL HIGH (ref 70–99)
Glucose-Capillary: 318 mg/dL — ABNORMAL HIGH (ref 70–99)
Glucose-Capillary: 375 mg/dL — ABNORMAL HIGH (ref 70–99)
Glucose-Capillary: 450 mg/dL — ABNORMAL HIGH (ref 70–99)
Glucose-Capillary: 476 mg/dL — ABNORMAL HIGH (ref 70–99)

## 2020-04-03 LAB — FERRITIN: Ferritin: 1620 ng/mL — ABNORMAL HIGH (ref 11–307)

## 2020-04-03 LAB — D-DIMER, QUANTITATIVE: D-Dimer, Quant: 6.83 ug/mL-FEU — ABNORMAL HIGH (ref 0.00–0.50)

## 2020-04-03 LAB — CREATININE, SERUM
Creatinine, Ser: 0.81 mg/dL (ref 0.44–1.00)
GFR, Estimated: >60 mL/min (ref >60)

## 2020-04-03 LAB — C-REACTIVE PROTEIN: CRP: 34.1 mg/dL — ABNORMAL HIGH (ref <1.0)

## 2020-04-03 MED ORDER — METOPROLOL TARTRATE 5 MG/5ML IV SOLN
INTRAVENOUS | Status: AC
  Filled 2020-04-03: qty 5

## 2020-04-03 MED ORDER — METOPROLOL TARTRATE 5 MG/5ML IV SOLN: 5.0000 mg | INTRAVENOUS | Status: DC | PRN

## 2020-04-03 MED ORDER — LORAZEPAM 2 MG/ML IJ SOLN
0.5000 mg | INTRAMUSCULAR | Status: DC | PRN
  Administered 2020-04-03 – 2020-04-04 (×6): 0.5 mg via INTRAVENOUS
  Filled 2020-04-03 (×6): qty 1

## 2020-04-03 MED ORDER — METOPROLOL TARTRATE 5 MG/5ML IV SOLN
5.0000 mg | INTRAVENOUS | Status: DC | PRN
  Administered 2020-04-03 (×3): 5 mg via INTRAVENOUS
  Filled 2020-04-03 (×2): qty 5

## 2020-04-03 MED ORDER — METOPROLOL TARTRATE 5 MG/5ML IV SOLN
5.0000 mg | Freq: Once | INTRAVENOUS | Status: AC
  Administered 2020-04-03: 5 mg via INTRAVENOUS
  Filled 2020-04-03: qty 5

## 2020-04-03 MED ORDER — METHYLPREDNISOLONE SODIUM SUCC 40 MG IJ SOLR
40.0000 mg | Freq: Two times a day (BID) | INTRAMUSCULAR | Status: DC
  Administered 2020-04-04 (×2): 40 mg via INTRAVENOUS
  Filled 2020-04-03: qty 1

## 2020-04-03 NOTE — Progress Notes (Signed)
Patient's restraints removed for 30 minutes while in the room to make sure she didn't remove needed oxygen.  Per shift report MD and management aware of situation with restraints and approved.   Patient calmer at this time and oxygen SpO2 98%.

## 2020-04-03 NOTE — Progress Notes (Signed)
Tried to only have one arm restrained at a time throughout this morning unsuccessfully.  Patient did remove cord to Bipap and dropped in SpO2 to under 60% rapidly.  Will page MD for physical order to have bipap and restraints on at this time. Not just a verbal order from report.

## 2020-04-03 NOTE — Progress Notes (Signed)
After morning assessment and giving the patient a bath, RN and NT decided to leave the patient's bilateral wrist restraints on, but not tie them to the bed to give the patient a break and she said she wouldn't pull on her lines anymore. Five minutes after leaving the room the patient pulled her bi-pap tubing undone and desaturated into the 20's and then single digits. Bi-pap placed back on the patient, then her HR has been sustaining in the 140-150's range since then. MD made aware. PO Xanax Dc'd, IV Ativan started, and a 1 time dose of 5mg  Metoprolol ordered to try and help HR.  Ativan has helped patient's respiratory drive and oxygen saturation's, will continue to monitor the HR after the Metoprolol is given and make MD aware if more is needed.

## 2020-04-03 NOTE — Progress Notes (Signed)
Chaplain tried to call into room to offer spiritual support and prayer.    Chaplain will follow-up.

## 2020-04-03 NOTE — Consult Note (Signed)
Consultation Note Date: 04/03/2020   Patient Name: [name]  DOB: 12-Feb-1956  MRN: [id number]  Age / Sex: 64 y.o., female  PCP: Tina Grippe, MD Referring Physician: Shon Hale, MD  Reason for Consultation: Establishing goals of care and Psychosocial/spiritual support  HPI/Patient Profile: 64 y.o. female  with past medical history of HTN/HLD, obesity, DM 2, COPD/tobacco abuse, GERD, arthritis in lower back, carpal tunnel syndrome, ventral hernia repair August 2020, umbilical hernia repair 2008 admitted on 03/09/2020 with Covid pneumonia with acute hypoxic respiratory failure.   Clinical Assessment and Goals of Care: [name] is viewed through the glass door of the intensive care.  She is Covid positive, has respiratory distress, BiPAP in place providing 90% FiO2, saturating in the mid 90s.  She will quickly desaturate if BiPAP is removed.  When she is left unattended, she will attempt to remove the mask and tubing.  When she is successful, she quickly desaturates under 70%.  The last time this happened her heart rate increased to the 140s to 150 and has sustained at this rate.  There is no family at bedside at this time.  Detail conference with bedside nursing staff related to patient condition, needs.  Call to son, [name].  I share with [name] that [name]'s respiratory status continues to decline.  She is on BiPAP at 100% FiO2, and when she removes the facemask or tubing, she quickly desaturates into the 70s.  This last occasion when she removes the mask, even briefly, she desaturated into the 20s, heart rate elevated into the 140s to 150s sustained.  I ask [name] to tell me what he is hearing from the doctors, but he is unable to verbalize this well.  He shares his concerns that his mother is not breathing well, and that "everything has been done that can be done".  He shares that this  all seems so sudden.  [name] states that he believes his mother is suffering, and will not recover, but that his older brother, [name] who lives in [place] just cannot hear this.  He states that [name] shares that he is coming, but there is a 17-hour drive to get here.  [name] is unsure when [name] will leave.  [name] asked that I call his older brother, [name] at [phone].   Call to [facility].  Initially [name] shares that he has no questions and does not need to talk about [name]'s health declines.  He starts sharing that he is 18 hours away, but he is coming.  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  I again offered to answer questions, and [name] states that he questions how his mother became so sick so quickly, sharing that he spoke with her just a few days ago.  Respiratory data reviewed in chart, and I share with [name] that since at least October 3 she is being requiring extreme amounts of oxygen.  We also talked about almost 2 weeks in the hospital without meaningful recovery and  history of tobacco abuse/COPD, and sequela of Covid pneumonia.  He states that he was not aware of this. [name] states that he is on the way, he has been offered video chat, but wants to come, pray with his mother, and say goodbye.  He plans on arriving tomorrow morning.  [name].  I share the conversation with [name] and explaining oxygen requirements, and [name]'s response that he was not aware that his mother was this ill.[name].   Tina and I talked about his brother's visit planned for tomorrow morning, facilitating video visit with friend, [name].  [name] also asks about help with cost of funeral services after [name] passes.  We briefly talked about some low cost cremation services.   Conference with attending and bedside nursing staff related to patient condition, needs, goals of care, family visit plan for tomorrow  HCPOA   NEXT OF KIN - sons Jacqulyn BathDennis  [name] and [name].  Sister [name]    SUMMARY OF RECOMMENDATIONS   At this point no intubation/chest compressions/defibrillation Family is requesting that we do what we can to keep [name] alive until son, [name], arrives a.m. of 10/13 PMT to continue to follow   Code Status/Advance Care Planning:  DNR  Symptom Management:   Per hospitalist, no additional needs at this time.   Palliative Prophylaxis:   Frequent Pain Assessment, Oral Care and Turn Reposition  Additional Recommendations (Limitations, Scope, Preferences):  Treat the treatable but no CPR/intubation, not full comfort care yet  Psycho-social/Spiritual:   Desire for further Chaplaincy support:yes  Additional Recommendations: Caregiving  Support/Resources, Compassionate Wean Education and Education on Hospice  Prognosis:   Unable to determine, based on outcomes.  Even with full BiPAP support days would be anticipated.  Without BiPAP, minutes.  Discharge Planning: Anticipated Hospital Death      Primary Diagnoses: Present on Admission:  Pneumonia due to COVID-19 virus   I have reviewed the medical record, interviewed the patient and family, and examined the patient. The following aspects are pertinent.  Past Medical History:  Diagnosis Date   Arthritis    degenerative lumbar spine    Carpal tunnel syndrome of left wrist 03/2016   Chronic back pain    COPD (chronic obstructive pulmonary disease) (HCC)    smokes 1/2ppd   Depression    GERD (gastroesophageal reflux disease)    High cholesterol    History of cerebral aneurysm repair    Hypertension    states under control with med., has been on med. x "a long time"   Insulin dependent diabetes mellitus    Social History   Socioeconomic History   Marital status: Single    Spouse name: Not on file   Number of children: 2   Years of education: 11   Highest education level: Not on file  Occupational History    Occupation: Unemployed  Tobacco Use   Smoking status: Current Every Day Smoker    Packs/day: 0.50    Years: 40.00    Pack years: 20.00    Types: Cigarettes   Smokeless tobacco: Never Used  Building services engineerVaping Use   Vaping Use: Never used  Substance and Sexual Activity   Alcohol use: No   Drug use: No   Sexual activity: Yes    Birth control/protection: Post-menopausal  Other Topics Concern   Not on file  Social History Narrative   Lives with boyfriend.   Caffeine use: Drinks coffee/tea (not often per patient)   Social Determinants  of Health   Financial Resource Strain:    Difficulty of Paying Living Expenses: Not on file  Food Insecurity:    Worried About Running Out of Food in the Last Year: Not on file   Ran Out of Food in the Last Year: Not on file  Transportation Needs:    Lack of Transportation (Medical): Not on file   Lack of Transportation (Non-Medical): Not on file  Physical Activity:    Days of Exercise per Week: Not on file   Minutes of Exercise per Session: Not on file  Stress:    Feeling of Stress : Not on file  Social Connections:    Frequency of Communication with Friends and Family: Not on file   Frequency of Social Gatherings with Friends and Family: Not on file   Attends Religious Services: Not on file   Active Member of Clubs or Organizations: Not on file   Attends Banker Meetings: Not on file   Marital Status: Not on file   Family History  Problem Relation Age of Onset   Cancer Father    COPD Mother    Colon cancer Neg Hx    Colon polyps Neg Hx    Scheduled Meds:  albuterol  2 puff Inhalation TID   baricitinib  4 mg Oral Daily   Chlorhexidine Gluconate Cloth  6 each Topical Daily   enoxaparin (LOVENOX) injection  50 mg Subcutaneous Q24H   feeding supplement (GLUCERNA SHAKE)  237 mL Oral BID BM   mouth rinse  15 mL Mouth Rinse BID   methylPREDNISolone (SOLU-MEDROL) injection  60 mg Intravenous Q12H    nicotine  21 mg Transdermal Daily   sodium chloride flush  10-40 mL Intracatheter Q12H   Continuous Infusions:  dextrose 20 mL/hr at 04/03/20 1224   famotidine (PEPCID) IV Stopped (04/02/20 2130)   PRN Meds:.acetaminophen, albuterol, chlorpheniramine-HYDROcodone, guaiFENesin-dextromethorphan, iohexol, labetalol, LORazepam, ondansetron **OR** ondansetron (ZOFRAN) IV, phenol, sodium chloride, sodium chloride flush Medications Prior to Admission:  Prior to Admission medications   Medication Sig Start Date End Date Taking? Authorizing Provider  atorvastatin (LIPITOR) 20 MG tablet Take 20 mg by mouth daily.   Yes [provider]  Cyanocobalamin (VITAMIN B-12 IJ) Inject 1,000 mcg as directed every 30 (thirty) days.    Yes [provider]  gabapentin (NEURONTIN) 300 MG capsule Take 1 capsule (300 mg total) by mouth 3 (three) times daily. 03/07/20  Yes Avegno, Zachery Dakins, FNP  hydrochlorothiazide (MICROZIDE) 12.5 MG capsule Take 12.5 mg by mouth daily.  12/30/18  Yes [provider]  HYDROcodone-acetaminophen (NORCO/VICODIN) 5-325 MG per tablet Take 1 tablet by mouth every 6 (six) hours as needed for moderate pain.   Yes [provider]  insulin aspart (NOVOLOG) 100 UNIT/ML FlexPen Inject 8 Units into the skin 4 (four) times daily.   Yes [provider]  Insulin Glargine (LANTUS) 100 UNIT/ML Solostar Pen Inject 40 Units into the skin at bedtime.    Yes [provider]  insulin lispro (HUMALOG) 100 UNIT/ML KwikPen Inject 10 Units into the skin 3 (three) times daily. 01/31/20  Yes [provider]  losartan (COZAAR) 100 MG tablet Take 100 mg by mouth daily.  12/30/18  Yes [provider]  metFORMIN (GLUCOPHAGE) 500 MG tablet Take 500 mg by mouth 2 (two) times daily with a meal.    Yes [provider]  rosuvastatin (CRESTOR) 10 MG tablet Take 10 mg by mouth daily. 03/07/20  Yes [provider]  Evaristo Bury  FLEXTOUCH 100  UNIT/ML FlexTouch Pen Inject 40 Units into the skin daily. 02/27/20  Yes [provider]  valACYclovir (VALTREX) 1000 MG tablet Take 1 tablet by mouth 2 (two) times daily as needed. 12/30/18  Yes [provider]  venlafaxine XR (EFFEXOR-XR) 75 MG 24 hr capsule Take 75 mg by mouth daily with breakfast.   Yes [provider]  HYDROcodone-homatropine (HYCODAN) 5-1.5 MG/5ML syrup Take 5 mLs by mouth at bedtime. Patient not taking: Reported on 03/10/2020 05/05/19   Gilda Crease, MD   No Known Allergies Review of Systems  Unable to perform ROS: Severe respiratory distress    Physical Exam Vitals and nursing note reviewed.  Constitutional:      General: She is in acute distress.     Appearance: She is obese. She is ill-appearing.  Cardiovascular:     Rate and Rhythm: Normal rate.  Pulmonary:     Effort: Respiratory distress present.     Comments: BiPAP  Skin:    General: Skin is warm and dry.  Neurological:     Comments: Unable to ask orientation questions   Psychiatric:     Comments: agitated at time, pulling lines      Vital Signs: BP (!) 147/90    Pulse 98    Temp 97.9 F (36.6 C) (Oral)    Resp (!) 23    Ht 5\' 2"  (1.575 m)    Wt 89 kg    SpO2 91%    BMI 35.89 kg/m  Pain Scale: 0-10   Pain Score: 0-No pain   SpO2: SpO2: 91 % O2 Device:SpO2: 91 % O2 Flow Rate: .O2 Flow Rate (L/min): 50 L/min  IO: Intake/output summary:   Intake/Output Summary (Last 24 hours) at 04/03/2020 1225 Last data filed at 04/03/2020 0356 Gross per 24 hour  Intake 1137.21 ml  Output 950 ml  Net 187.21 ml    LBM: Last BM Date: 03/31/20 Baseline Weight: Weight: 90.9 kg Most recent weight: Weight: 89 kg     Palliative Assessment/Data:   Flowsheet Rows     Most Recent Value  Intake Tab  Referral Department Hospitalist  Unit at Time of Referral ICU  Palliative Care Primary Diagnosis Pulmonary  Date Notified 04/02/20  Palliative Care Type New Palliative care   Reason for referral Clarify Goals of Care  Date of Admission 02/22/2020  Date first seen by Palliative Care 04/03/20  # of days Palliative referral response time 1 Day(s)  # of days IP prior to Palliative referral 13  Clinical Assessment  Palliative Performance Scale Score 10%  Pain Max last 24 hours Not able to report  Pain Min Last 24 hours Not able to report  Dyspnea Max Last 24 Hours Not able to report  Dyspnea Min Last 24 hours Not able to report  Psychosocial & Spiritual Assessment  Palliative Care Outcomes      Time In: 1010 Time Out: 1130 Time Total: 80 minutes  Greater than 50%  of this time was spent counseling and coordinating care related to the above assessment and plan.  Signed by: 06/03/20, NP   Please contact Palliative Medicine Team phone at [phone] for questions and concerns.  For individual provider: See [phone]

## 2020-04-03 NOTE — Plan of Care (Signed)
Discussed with patient plan of care for the evening, pain management and need to wear Bipap tonight with some teach back displayed.  Patient removed from [facility] and oxygen saturation >93%.  Patient's sister called and updated about her oxygen, breathing and need for anti-anxiety medication.

## 2020-04-03 NOTE — Progress Notes (Signed)
Patient Demographics:    [name], is a 64 y.o. female, DOB - 01-20-1956, BJS:[id number]  Admit date - 03/18/2020   Admitting Physician Frankey Shown, DO  Outpatient Primary MD for the patient is Pearson Grippe, MD  LOS - 14  No chief complaint on file.       Subjective:    [name] continues to feel short of breath overall.   04/03/20--   -Episodes of persistent hypoxia, dyspnea and tachycardia persist  -Has been BiPAP and heated high flow dependent- in addition to a nonrebreather bag  Pt's son who lives in [place] he is driving here and plans to be here on 04/10/2020 -[place] is awaiting arrival of his brother from [place] prior to making a decision regarding possible transition to full comfort care   Assessment  & Plan :    Principal Problem:   Pneumonia due to COVID-19 virus Active Problems:   Acute respiratory failure with hypoxia (HCC)   Severe sepsis (HCC)   Leukopenia   Thrombocytopenia (HCC)   Hyperglycemia due to diabetes mellitus (HCC)   Essential hypertension   Type 2 diabetes mellitus with hyperlipidemia (HCC)   Obesity (BMI 30-39.9)   Tobacco abuse   Lactic acidosis   Acute hypoxemic respiratory failure due to COVID-19 [facility])   Goals of care, counseling/discussion   Palliative care by specialist   Counseling regarding end of life decision making  Brief Summary:-  64 y.o. female with medical history significant for hypertension, type 2 DM, hyperlipidemia, obesity and tobacco abuse admitted on 02/24/2020 with acute hypoxic respiratory failure secondary to Covid Pneumonia -Patient is Not vaccinated against COVID-19 infection --Severe Hypoxia Persist and still requiring HFNC AND NRB-   -Pt's son who lives in [place] he is driving here and plans to be here on 04/17/2020 -Patient son who [place] is awaiting arrival of his  brother from [place] prior to making a decision regarding possible transition to full comfort care --Anticipate transition to comfort care and in-hospital death  A/p 1)Acute hypoxic respiratory failure secondary to COVID-19 infection/Pneumonia--- The treatment plan and use of medications  for treatment of COVID-19 infection and possible side effects were discussed with patient/family -----Patient/Family verbalizes understanding and agrees to treatment protocols  04/03/20---Hypoxia and dyspnea is worse-- please see subjective section above --Attempt to maintain euvolemic state --Zinc and vitamin C as ordered -Albuterol inhaler as needed -Accu-Cheks/fingersticks while on high-dose steroids -PPI while on high-dose steroids -Enhanced dosage of anticoagulant for DVT prophylaxis given hypercoagulable state with COVID-19 infections -Continue IV Steroids started on 03/09/2020 (decrease to 40 mg q12 due to severe hyperglycemia) -She has completed a course of remdesivir -Given persistent severe hypoxia and inflammatory markers-baricitinib started on 03/21/2020 (completed 14 days of therapy) -Repeat chest x-ray from 03/27/2020 consistent with Covid pneumonia which appears to have improved radiologically -Patient was too unstable to go to CT scanner for CTA chest at this time due to persistent hypoxemia -Lower extremity venous Dopplers negative for DVT  COVID-19 Labs  Recent Labs    04/01/20 0607 04/02/20 0452 04/02/20 0453 04/03/20 0354  DDIMER 5.73*  --  5.87* 6.83*  FERRITIN 1,747* 1,706*  --  1,620*  CRP 17.2* 21.0*  --  34.1*    Lab Results  Component Value Date   SARSCOV2NAA POSITIVE (A) 04-02-20   SARSCOV2NAA NEGATIVE 02/15/2019   2) COPD/tobacco abuse---  steroids and bronchodilators as above #1  3)HLD-stopped Lipitor for now, watch LFTs closely with baricitinib use  4)HTN--developed tachycardia-- on IV metoprolol as needed for rate control  5)Leukopenia and Thrombocytopenia---  resolved on CBC from 03/22/20  6)Class II Obesity-- this increases overall morbidity and mortality -Body mass index is 35.89 kg/m.  7)Hyperglycemia/hypoglycemia--- patient had episodes of hypoglycemia due to lack of oral intake in the setting of prolonged BiPAP use, insulin therapy has been discontinued, -IV dextrose started, blood sugars are now trending up we will taper down dextrose solution - 8)Social/Ethics----- RN [name] at bedside--- we had extensive conversation with patient regarding goals of care and advanced directives -Patient repeatedly states myself and the RN at bedside that she wants to be a DNR/DNI----she understands that given her worsening respiratory status she may die- -palliative care consult noted --I called and updated patient's son ([name]) at [phone] on patient's condition and patient's decision for DNR/DNI status -04/02/20- Patient's son  came to visit patient with clergy/Champlain -Patient son who lives here in discussion with his brother who lives in [place] regarding possible transition to full comfort care  04/03/20 Pt's son who lives in [place] he is driving here and plans to be here on 03/28/2020 -Patient son who [place] is awaiting arrival of his brother from [place] prior to making a decision regarding possible transition to full comfort care  Disposition/Need for in-Hospital Stay- patient unable to be discharged at this time due to --severe hypoxic respiratory failure in the setting of COVID-19 pneumonia requiring IV steroids and supplemental oxygen -Anticipate transition to comfort care and in-hospital death  Status is: Inpatient  Remains inpatient appropriate because:severe hypoxic respiratory failure in the setting of COVID-19 pneumonia requiring  IV steroids and supplemental oxygen  Disposition: The patient is from: Home              Anticipated d/c is to: Home              Anticipated d/c date is: > 3  days              Patient currently is not medically stable to d/c. Barriers: Not Clinically Stable- severe hypoxic respiratory failure in the setting of COVID-19 pneumonia requiring  IV steroids and supplemental oxygen --Anticipate transition to comfort care and in-hospital death  Code Status :  DNR  Family Communication:   Discussed with pt's son [name] at [phone] --  Consults  : Palliative care DVT Prophylaxis  :  Lovenox -  - SCDs    Lab Results  Component Value Date   PLT 186 03/25/2020    Inpatient Medications  Scheduled Meds: . albuterol  2 puff Inhalation TID  . baricitinib  4 mg Oral Daily  . Chlorhexidine Gluconate Cloth  6 each Topical Daily  . enoxaparin (LOVENOX) injection  50 mg Subcutaneous Q24H  . feeding supplement (GLUCERNA SHAKE)  237 mL Oral BID BM  . mouth rinse  15 mL Mouth Rinse BID  . [START ON 03/23/2020] methylPREDNISolone (SOLU-MEDROL) injection  40 mg Intravenous Q12H  . nicotine  21 mg Transdermal Daily  . sodium chloride flush  10-40 mL Intracatheter Q12H   Continuous Infusions: . dextrose 20 mL/hr at 04/03/20 1507  . famotidine (PEPCID) IV Stopped (04/02/20 2130)   PRN Meds:.acetaminophen,  albuterol, chlorpheniramine-HYDROcodone, guaiFENesin-dextromethorphan, iohexol, labetalol, LORazepam, metoprolol tartrate, ondansetron **OR** ondansetron (ZOFRAN) IV, phenol, sodium chloride, sodium chloride flush    Anti-infectives (From admission, onward)   Start     Dose/Rate Route Frequency Ordered Stop   03/21/20 1000  remdesivir 100 mg in sodium chloride 0.9 % 100 mL IVPB  Status:  Discontinued       "Followed by" Linked Group Details   100 mg 200 mL/hr over 30 Minutes Intravenous Daily 03/12/2020 2013 03/21/2020 2020   03/21/20 1000  remdesivir 100 mg in sodium chloride 0.9 % 100 mL IVPB        100 mg 200 mL/hr over 30 Minutes Intravenous Daily 03/16/2020 2025 03/24/20 0857   03/11/2020 2030  remdesivir 100 mg in sodium chloride 0.9 % 100  mL IVPB  Status:  Discontinued       "And" Linked Group Details   100 mg 200 mL/hr over 30 Minutes Intravenous Daily 03/06/2020 2016 02/28/2020 2019   02/24/2020 2030  remdesivir 100 mg in sodium chloride 0.9 % 100 mL IVPB  Status:  Discontinued       "And" Linked Group Details   100 mg 200 mL/hr over 30 Minutes Intravenous  Once 03/18/2020 2016 03/05/2020 2019   03/21/2020 2030  remdesivir 100 mg in sodium chloride 0.9 % 100 mL IVPB  Status:  Discontinued        100 mg 200 mL/hr over 30 Minutes Intravenous Daily 02/28/2020 2017 03/04/2020 2019   03/04/2020 2030  remdesivir 100 mg in sodium chloride 0.9 % 100 mL IVPB  Status:  Discontinued        100 mg 200 mL/hr over 30 Minutes Intravenous  Once 03/11/2020 2021 02/25/2020 2025   03/08/2020 2030  remdesivir 100 mg in sodium chloride 0.9 % 100 mL IVPB  Status:  Discontinued       "And" Linked Group Details   100 mg 200 mL/hr over 30 Minutes Intravenous Daily 03/11/2020 2023 03/21/20 1116   02/25/2020 2030  remdesivir 100 mg in sodium chloride 0.9 % 100 mL IVPB       "And" Linked Group Details   100 mg 200 mL/hr over 30 Minutes Intravenous  Once 03/08/2020 2023 03/01/2020 2225   02/24/2020 2013  remdesivir 200 mg in sodium chloride 0.9% 250 mL IVPB  Status:  Discontinued       "Followed by" Linked Group Details   200 mg 580 mL/hr over 30 Minutes Intravenous Once 03/11/2020 2013 02/27/2020 2020        Objective:   Vitals:   04/03/20 1103 04/03/20 1138 04/03/20 1300 04/03/20 1600  BP: (!) 147/90  (!) 133/94 (!) 96/59  Pulse: 98  73   Resp: (!) 23  (!) 33 19  Temp:  98 F (36.7 C)    TempSrc:  Oral    SpO2: (!) 88% 91% 90%   Weight:      Height:        Wt Readings from Last 3 Encounters:  04/02/20 89 kg  09/14/19 94.1 kg  05/05/19 91.6 kg     Intake/Output Summary (Last 24 hours) at 04/03/2020 1635 Last data filed at 04/03/2020 1630 Gross per 24 hour  Intake 1400.22 ml  Output 1450 ml  Net -49.78 ml    Physical Exam General exam: Alert, awake,  episodes of disorientation from time to time nose- NRB + HFNC /Bipap Respiratory system: Diminished breath sounds, few scattered rhonchi cardiovascular system:RRR. No murmurs, rubs, gallops. Gastrointestinal system: Abdomen is soft, NT,  ND, +BS   Central nervous system: Generalized weakness, no focal neurological deficits. Extremities: No C/C/E, +pedal pulses Skin: No rashes, lesions or ulcers Psychiatry: Episodes of disorientation and anxiety from time to time    Data Review:   Micro Results No results found for this or any previous visit (from the past 240 hour(s)).  Radiology Reports US Venous Img Lower Bilateral (DVT)  Result Date: 03/31/2020 CLINICAL DATA:  History of COVID-19 infection now with hypoxemia. Evaluate for DVT. EXAM: BILATERAL LOWER EXTREMITY VENOUS DOPPLER ULTRASOUND TECHNIQUE: Gray-scale sonography with graded compression, as well as color Doppler and duplex ultrasound were performed to evaluate the lower extremity deep venous systems from the level of the common femoral vein and including the common femoral, femoral, profunda femoral, popliteal and calf veins including the posterior tibial, peroneal and gastrocnemius veins when visible. The superficial great saphenous vein was also interrogated. Spectral Doppler was utilized to evaluate flow at rest and with distal augmentation maneuvers in the common femoral, femoral and popliteal veins. COMPARISON:  None. FINDINGS: RIGHT LOWER EXTREMITY Common Femoral Vein: No evidence of thrombus. Normal compressibility, respiratory phasicity and response to augmentation. Saphenofemoral Junction: No evidence of thrombus. Normal compressibility and flow on color Doppler imaging. Profunda Femoral Vein: No evidence of thrombus. Normal compressibility and flow on color Doppler imaging. Femoral Vein: No evidence of thrombus. Normal compressibility, respiratory phasicity and response to augmentation. Popliteal Vein: No evidence of thrombus. Normal  compressibility, respiratory phasicity and response to augmentation. Calf Veins: No evidence of thrombus. Normal compressibility and flow on color Doppler imaging. Superficial Great Saphenous Vein: No evidence of thrombus. Normal compressibility. Venous Reflux:  None. Other Findings:  None. LEFT LOWER EXTREMITY Common Femoral Vein: No evidence of thrombus. Normal compressibility, respiratory phasicity and response to augmentation. Saphenofemoral Junction: No evidence of thrombus. Normal compressibility and flow on color Doppler imaging. Profunda Femoral Vein: No evidence of thrombus. Normal compressibility and flow on color Doppler imaging. Femoral Vein: No evidence of thrombus. Normal compressibility, respiratory phasicity and response to augmentation. Popliteal Vein: No evidence of thrombus. Normal compressibility, respiratory phasicity and response to augmentation. Calf Veins: No evidence of thrombus. Normal compressibility and flow on color Doppler imaging. Superficial Great Saphenous Vein: No evidence of thrombus. Normal compressibility. Venous Reflux:  None. Other Findings: Note is made of an approximately 3.9 x 1.4 x 3.1 cm fluid collection with the left popliteal fossa compatible with a Baker's cyst. IMPRESSION: 1. No evidence of DVT within either lower extremity. 2. Incidental note made of approximately 3.9 cm left-sided Baker's cyst. Electronically Signed   By: Simonne ComeJohn  Watts M.D.   On: 03/31/2020 13:53   DG CHEST PORT 1 VIEW  Result Date: 03/27/2020 CLINICAL DATA:  Acute respiratory failure, COVID-19 positivity EXAM: PORTABLE CHEST 1 VIEW COMPARISON:  2019/12/18 FINDINGS: Cardiac shadow is stable. Mild airspace opacities are noted bilaterally consistent with the given clinical history but improved when compared with the prior exam. No sizable effusion is seen. No bony abnormality is noted. IMPRESSION: Improved bilateral infiltrates consistent with the given clinical history. Electronically Signed   By:  Alcide CleverMark  Lukens M.D.   On: 03/27/2020 20:53   DG Chest Portable 1 View  Result Date: 06-29-19 CLINICAL DATA:  Weakness, shortness of breath and cough. EXAM: PORTABLE CHEST 1 VIEW COMPARISON:  March 09, 2018 FINDINGS: Moderate severity bilateral multifocal infiltrates are seen. There is no evidence of a pleural effusion or pneumothorax. The heart size and mediastinal contours are within normal limits. The visualized skeletal structures are  unremarkable. IMPRESSION: Moderate severity bilateral multifocal infiltrates. Electronically Signed   By: Aram Candela M.D.   On: 03/22/2020 18:33   [place] EKG SITE RITE  Result Date: 03/23/2020 If Site Rite image not attached, placement could not be confirmed due to current cardiac rhythm.    CBC No results for input(s): WBC, HGB, HCT, PLT, MCV, MCH, MCHC, RDW, LYMPHSABS, MONOABS, EOSABS, BASOSABS, BANDABS in the last 168 hours.  Invalid input(s): NEUTRABS, BANDSABD  Chemistries  Recent Labs  Lab 03/28/20 0420 03/29/20 0506 03/29/20 2128 03/30/20 0459 04/03/20 0354  NA 134* 131* 129* 132*  --   K 4.0 4.3 4.7 4.3  --   CL 96* 93* 93* 96*  --   CO2 --   GLUCOSE 162* 208* 420* 266*  --   BUN 34* 29* 30* 24*  --   CREATININE 0.85 0.88 0.91 0.75 0.81  CALCIUM 8.3* 8.3* 7.9* 8.0*  --   AST 41 46*  --  44*  --   ALT 36 41  --  44  --   ALKPHOS 168* 193*  --  191*  --   BILITOT 0.7 0.6  --  0.5  --     No results for input(s): CHOL, HDL, LDLCALC, TRIG, CHOLHDL, LDLDIRECT in the last 72 hours.  Lab Results  Component Value Date   HGBA1C 10.3 (H) 02/28/2020   ------------------------------------------------------------------------------------------------------------------ No results for input(s): TSH, T4TOTAL, T3FREE, THYROIDAB in the last 72 hours.  Invalid input(s): FREET3 ------------------------------------------------------------------------------------------------------------------ Recent Labs    04/02/20 0452  04/03/20 0354  FERRITIN 1,706* 1,620*    Coagulation profile No results for input(s): INR, PROTIME in the last 168 hours.  Recent Labs    04/02/20 0453 04/03/20 0354  DDIMER 5.87* 6.83*   Cardiac Enzymes No results for input(s): CKMB, TROPONINI, MYOGLOBIN in the last 168 hours.  Invalid input(s): CK ------------------------------------------------------------------------------------------------------------------ No results found for: BNP  Shon Hale M.D on 04/03/2020 at 4:35 PM  Go to [email or web address] - for contact info  [facility] - Office  [phone]

## 2020-04-04 ENCOUNTER — Encounter (HOSPITAL_COMMUNITY): Payer: Self-pay | Admitting: Internal Medicine

## 2020-04-04 LAB — GLUCOSE, CAPILLARY
Glucose-Capillary: 324 mg/dL — ABNORMAL HIGH (ref 70–99)
Glucose-Capillary: 389 mg/dL — ABNORMAL HIGH (ref 70–99)
Glucose-Capillary: 396 mg/dL — ABNORMAL HIGH (ref 70–99)
Glucose-Capillary: 401 mg/dL — ABNORMAL HIGH (ref 70–99)
Glucose-Capillary: 409 mg/dL — ABNORMAL HIGH (ref 70–99)
Glucose-Capillary: 415 mg/dL — ABNORMAL HIGH (ref 70–99)
Glucose-Capillary: 488 mg/dL — ABNORMAL HIGH (ref 70–99)

## 2020-04-04 LAB — FERRITIN: Ferritin: 2254 ng/mL — ABNORMAL HIGH (ref 11–307)

## 2020-04-04 LAB — D-DIMER, QUANTITATIVE: D-Dimer, Quant: 5.01 ug/mL-FEU — ABNORMAL HIGH (ref 0.00–0.50)

## 2020-04-04 LAB — C-REACTIVE PROTEIN: CRP: 20.3 mg/dL — ABNORMAL HIGH (ref <1.0)

## 2020-04-04 MED ORDER — HYDROMORPHONE HCL 1 MG/ML IJ SOLN
0.5000 mg | INTRAMUSCULAR | Status: DC | PRN
  Administered 2020-04-04: 1 mg via INTRAVENOUS

## 2020-04-04 MED ORDER — ACETAMINOPHEN 650 MG RE SUPP
650.0000 mg | RECTAL | Status: DC | PRN
  Administered 2020-04-04: 650 mg via RECTAL
  Filled 2020-04-04: qty 1

## 2020-04-04 MED ORDER — HYDROMORPHONE HCL 1 MG/ML IJ SOLN
0.5000 mg | INTRAMUSCULAR | Status: DC | PRN
  Administered 2020-04-04 (×2): 1 mg via INTRAVENOUS
  Filled 2020-04-04 (×2): qty 1

## 2020-04-04 MED ORDER — SODIUM CHLORIDE 0.9 % IV SOLN
1.0000 mg/h | INTRAVENOUS | Status: DC
  Administered 2020-04-04: 1 mg/h via INTRAVENOUS
  Administered 2020-04-05: 2 mg/h via INTRAVENOUS
  Filled 2020-04-04: qty 5

## 2020-04-04 MED ORDER — INSULIN ASPART 100 UNIT/ML ~~LOC~~ SOLN
0.0000 [IU] | SUBCUTANEOUS | Status: DC
  Administered 2020-04-04: 15 [IU] via SUBCUTANEOUS

## 2020-04-04 MED ORDER — INSULIN ASPART 100 UNIT/ML ~~LOC~~ SOLN
0.0000 [IU] | SUBCUTANEOUS | Status: DC
  Administered 2020-04-04: 9 [IU] via SUBCUTANEOUS

## 2020-04-04 MED ORDER — INSULIN ASPART 100 UNIT/ML ~~LOC~~ SOLN
0.0000 [IU] | SUBCUTANEOUS | Status: DC
  Administered 2020-04-04: 20 [IU] via SUBCUTANEOUS

## 2020-04-04 MED ORDER — MORPHINE SULFATE (PF) 2 MG/ML IV SOLN
1.0000 mg | INTRAVENOUS | Status: DC | PRN
  Administered 2020-04-04: 1 mg via INTRAVENOUS
  Filled 2020-04-04: qty 1

## 2020-04-04 MED ORDER — MORPHINE SULFATE (PF) 2 MG/ML IV SOLN
1.0000 mg | INTRAVENOUS | Status: DC | PRN
  Administered 2020-04-04: 2 mg via INTRAVENOUS
  Filled 2020-04-04: qty 1

## 2020-04-05 NOTE — Progress Notes (Signed)
Patient belonging bag found at the nurses station this morning 10/14. Patient's son was called to allow him to come pick up patient's belongings. Money was in the bag. It was counted by this RN and NT. Son aware of money, driver's license, and bank card as well as a shirt, shorts and shoes. Son asked this RN and NT to throw away clothing, but to give money, driver's license, and bank card to security for him to pick up at his convenience. All money was counted and placed in sealed envelope along with driver's license and bank card. Envelope given to security. AC and director aware.

## 2020-04-05 NOTE — Progress Notes (Signed)
Spoke with patient's daughter in law who is coming with her husband from [place]. She explained they were stopping for the night outside of [place] and would be here first thing in the morning to see Ms. Niebla. It was explained to her that the patient would have to be moved to the morgue. Both her and her husband were verbally in agreement with this. They stated that they had made arrangements with a funeral home but did not specify which one. Pt has been cleaned and placed in body bag with toe tag and pt label in place. Body bag has been labeled with multiple ID stickers as well. Belongings are with the body and labeled as well. 50cc of Dilaudid drip that were remaining in IVPB were wasted with 2nd RN H.Tetreault.

## 2020-04-23 NOTE — Progress Notes (Signed)
Chaplain engaged in follow-up with [name] and his wife, escorting them to ICU family waiting area.  [name] shared how alone he has felt in making decisions for his mom and navigating her needs and desires at this time.  He knows that he does not want his mom to continue to suffer.  [name]' brother is on the way from out of state and wants his mother to continue fighting.  Chaplain and nurse celebrated the love both sons have for their mother.  Nurse did a great job of explaining [name]'s current state and offering empathy as [name] shared he also lost his father a year ago.  Chaplain will continue to follow and offer support.  Chaplain has offered the ministries of support and listening.    03/28/2020 1200  Clinical Encounter Type  Visited With Family  Visit Type Follow-up  Spiritual Encounters  Spiritual Needs Emotional  Stress Factors  Family Stress Factors Major life changes

## 2020-04-23 NOTE — Progress Notes (Signed)
Called the patient's son Shonteria Abeln to alert him to a change in his mother's status.  Communicated to [place] that Ms. Lusk had a drop in her HR Resp BP and O2 saturation.  Shortly after getting off the phone with [name], Ms. Leet passed away. Time of death was October 10, 2113. Verified by two RNs. No heart sounds auscultated, no pulse palpated. [name] was called back and informed of his mothers passing at 10-Oct-2128. Other son is on his way in from [place] and [name] states he will contact him. All patient belongings are packed in patient belonging bag. Awaiting arrival of family and friend before preparing body for transport to the morgue. [facility] contacted Referral number is [phone] from rep [facility]. Body is released not suitable for donation.

## 2020-04-23 NOTE — Progress Notes (Signed)
Palliative:   Mrs. Faubert is struggling to breathe with BiPAP in place providing 100% FiO2.  She continuously attempts to remove BiPAP mask unless she is restrained.  She is able to tell me her name, but does not answer any other orientation questions.  Her saturations reduce to the teens when she removes the BiPAP and it takes several minutes for her saturation levels to return to 80s or greater.  She appears to be actively dying.  Call to son, [name].  No answer, unable to leave voicemail message. Call to son, Juleen China.  Share that Mrs. Nobile is not doing well, and suggest that they come to visit if they so desire.  Return later in the morning to find that Mrs. D Jennings symptoms are not controlled with morphine.  Multidisciplinary conference related to patient condition, needs.  As we are talking staff notes that son [name] is calling requesting to speak with the charge nurse.  This nurse practitioner along with attending, Dr. Laural Benes and bedside nursing staff communicate with [name] the severity of Mrs. Nila's illness.  He seems unwilling/unable to hear about her declines.  At one point his wife even suggest that we intubate Mrs. Dunning now that she is confused.  I share that Mrs. Zuckerman made her choices clear, and we will not go against her desire to not be intubated.   Trial of Dilaudid which does indeed reduce respiratory rate.  I believe that with continued use of as needed Dilaudid Mrs. Vuncannon will be able to survive at least until her son [name] arrives from [place].  Return later in the day for family meeting with son [name].  Son [name] is able to accurately name the issues that his mother is facing.  He is keeping her at the center, and shares his concern that his brother [name] is not excepting that Mrs. Clawson is at end-of-life.  Psychosocial support given.  Return later in the afternoon for symptom management check.  As needed Dilaudid is given every 1-1 and half  hours.  Continuous infusion orders written to be implemented if every hour push of Dilaudid is not effective to keep respiratory rate below 30.  Conference with bedside nursing staff and attending related symptom management.  Plan:  PRN Dilaudid until son [name] arrives from [place] (hopefully around 6-7pm today).  In hospital death today/tonight would not be surprising.  If Mrs. Madrid shares that she wants BiPAP removed tomorrow am, the medical team will respect her wishes and allow a natural death.   70 minutes, extended time Lillia Carmel, NP Palliative Medicine Team

## 2020-04-23 NOTE — Progress Notes (Signed)
CBG of 488. MD notified. Order to temporarily hold D10 IVF that is going at [facility] at this time. Check cbg q1h til end of shift.

## 2020-04-23 NOTE — Progress Notes (Signed)
Pt is no longer interfering with care. She is calm and resting on the dilaudid drip. Restraints have been released and removed from patient.

## 2020-04-23 NOTE — Progress Notes (Signed)
Nutrition Follow-up  DOCUMENTATION CODES:   Obesity unspecified  INTERVENTION:  Patient is NPO   RD will continue to follow   NUTRITION DIAGNOSIS:   Increased nutrient needs related to catabolic illness (pneumonia due to COVID-19 virus infection) as evidenced by estimated needs.   GOAL:   Patient will meet greater than or equal to 90% of their needs   MONITOR:   PO intake, Weight trends, Labs, I & O's, Supplement acceptance  REASON FOR ASSESSMENT:   Rounds    ASSESSMENT:  64 year old female with history of HTN, IDDM HLD, COPD, GERD, presented with 2 week history of worsening generalized weakness, decreased appetite, cough, and increasing shortness of breath. Pt admitted for pneumonia due to COVID-19 virus infection.  10/13 Patient last po intake recorded 10/7 @ 50%. Her energy intake </= 50% for >/= 5 days.  She has been NPO since 10/11. Dyspnea, requiring BIPAP and heated high flow + nonrebreather bag.   Palliative is working with family regarding goals of care. Son from out of state is to arrive this morning for meeting to further discuss wishes. Anticipate transition to comfort care and in-hospital death per MD.  Acute weight loss 6.2 kg (7%) since admission.    Medications reviewed and include: Ativan, Pepcid, Prednisone and Nicoderm  Labs: CRP-34.1 (trending up), Ferritin-1620 (H), DDIMER-6.83 (H) BMP Latest Ref Rng & Units 04/03/2020 03/30/2020 03/29/2020  Glucose 70 - 99 mg/dL - 937(J) 696(V)  BUN 8 - 23 mg/dL - 89(F) 81(O)  Creatinine 0.44 - 1.00 mg/dL 1.75 1.02 5.85  Sodium 135 - 145 mmol/L - 132(L) 129(L)  Potassium 3.5 - 5.1 mmol/L - 4.3 4.7  Chloride 98 - 111 mmol/L - 96(L) 93(L)  CO2 22 - 32 mmol/L - 27 25  Calcium 8.9 - 10.3 mg/dL - 8.0(L) 7.9(L)     NUTRITION - FOCUSED PHYSICAL EXAM: RD working remotely  Diet Order:   Diet Order            Diet NPO time specified  Diet effective now                 EDUCATION NEEDS:   Not appropriate for  education at this time  Skin:  Skin Assessment: Reviewed RN Assessment  Last BM:  10/9  Height:   Ht Readings from Last 1 Encounters:  03/22/20 5\' 2"  (1.575 m)    Weight:   Wt Readings from Last 1 Encounters:  04/01/2020 83.3 kg    Ideal Body Weight:  50 kg  BMI:  Body mass index is 33.59 kg/m.  Estimated Nutritional Needs:   Kcal:  2153-2252  Protein:  100-115  Fluid:  >/= 2.1 L   01-12-1988 MS,RD,CSG,LDN Pager: Royann Shivers

## 2020-04-23 NOTE — Death Summary Note (Signed)
DEATH SUMMARY   Patient Details  Name: [name] MRN: [id number] DOB: 01/07/1956  Admission/Discharge Information   Admit Date:  Apr 15, 2020  Date of Death: Date of Death: Apr 30, 2020  Time of Death: Time of Death: 11/01/13  Length of Stay: 2022-11-02  Referring Physician: Pearson Grippe, MD   Reason(s) for Hospitalization  ADMISSION HPI: [name] is a 64 y.o. female with medical history significant for hypertension, type 2 DM, hyperlipidemia, obesity and tobacco abuse who presents to the emergency department via EMS due to 2-week onset of worsening generalized weakness. Patient complained of decreased appetite without loss of taste or smell since last 2 weeks with increased weakness, she complained of cough with occasional clear mucus secretion as well as increasing shortness of breath. She states that symptoms were similar to last time she was diagnosed with pneumonia. She states that she did not take her insulin yesterday or today due to poor appetite, she denies fever, chills, nausea, vomiting or abdominal pain. Patient denies any known sick contact. She activated EMS today due to worsening weakness. Patient states that she has not smoked cigarettes in about a week.  She has not had any Covid vaccine.  ED Course:  In the emergency department, she was tachypneic, temperature was 97.68F and she was hypoxic with an O2 sat of 86% on supplemental oxygen via [place] at 4 PM, so she was placed on NRB at 15 L with O2 sat at 92%. Work-up in the ED showed leukopenia, thrombocytopenia, hyperglycemia, mild hyponatremia, AST 50. SARS coronavirus 2 was positive. Lactic acid 2.3. Chest x-ray showed moderate severity bilateral multifocal infiltrates. Hospitalist was asked to admit. For further evaluation and management.  Diagnoses  Preliminary cause of death:  Secondary Diagnoses (including complications and co-morbidities):  Principal Problem:   Pneumonia due to COVID-19 virus Active Problems:   Acute respiratory  failure with hypoxia (HCC)   Severe sepsis (HCC)   Leukopenia   Thrombocytopenia (HCC)   Hyperglycemia due to diabetes mellitus (HCC)   Essential hypertension   Type 2 diabetes mellitus with hyperlipidemia (HCC)   Obesity (BMI 30-39.9)   Tobacco abuse   Lactic acidosis   Acute hypoxemic respiratory failure due to COVID-19 [facility])   Goals of care, counseling/discussion   Palliative care by specialist   Counseling regarding end of life decision making   Brief Hospital Course (including significant findings, care, treatment, and services provided and events leading to death)  Brief Summary: 64 y.o.femalewith medical history significant forhypertension, type 2 DM, hyperlipidemia, obesity and tobacco abuse admitted on April 15, 2020 with acute hypoxic respiratory failure secondary to Covid Pneumonia -Patient is Not vaccinated against COVID-19 infection --Unfortunately patient continues to decline despite maximal therapies.  Pt is not having any meaningful recovery.  I spoke with palliative medicine team and we have discussed with sons that patient is dying and that comfort measures are appropriate.  Son present and is agreeable to IV dilaudid and infusion if necessary for comfort.   -Pt's son who lives in [place] he is driving here and plans to be here on 30-Apr-2020 but several hours away.   -Patient son who lives here in the [place] is awaiting arrival of his brother from [place] prior to making a decision regarding possible transition to full comfort care.  --Anticipate in-hospital death  [name] is having more respiratory distress this morning and pulling off bipap on multiple attempts.  She is having air hunger and appears to be having much more respiratory distress with each breath.  She still wants bipap removed and continues to try to remove it. She had complained of pain to the nurse all over body and nurse informed me and we tried IV morphine but that did not give much relief.   Palliative tried IV dilaudid with good results.  Pt much more comfortable.     Pertinent Labs and Studies  Significant Diagnostic Studies US Venous Img Lower Bilateral (DVT)  Result Date: 03/31/2020 CLINICAL DATA:  History of COVID-19 infection now with hypoxemia. Evaluate for DVT. EXAM: BILATERAL LOWER EXTREMITY VENOUS DOPPLER ULTRASOUND TECHNIQUE: Gray-scale sonography with graded compression, as well as color Doppler and duplex ultrasound were performed to evaluate the lower extremity deep venous systems from the level of the common femoral vein and including the common femoral, femoral, profunda femoral, popliteal and calf veins including the posterior tibial, peroneal and gastrocnemius veins when visible. The superficial great saphenous vein was also interrogated. Spectral Doppler was utilized to evaluate flow at rest and with distal augmentation maneuvers in the common femoral, femoral and popliteal veins. COMPARISON:  None. FINDINGS: RIGHT LOWER EXTREMITY Common Femoral Vein: No evidence of thrombus. Normal compressibility, respiratory phasicity and response to augmentation. Saphenofemoral Junction: No evidence of thrombus. Normal compressibility and flow on color Doppler imaging. Profunda Femoral Vein: No evidence of thrombus. Normal compressibility and flow on color Doppler imaging. Femoral Vein: No evidence of thrombus. Normal compressibility, respiratory phasicity and response to augmentation. Popliteal Vein: No evidence of thrombus. Normal compressibility, respiratory phasicity and response to augmentation. Calf Veins: No evidence of thrombus. Normal compressibility and flow on color Doppler imaging. Superficial Great Saphenous Vein: No evidence of thrombus. Normal compressibility. Venous Reflux:  None. Other Findings:  None. LEFT LOWER EXTREMITY Common Femoral Vein: No evidence of thrombus. Normal compressibility, respiratory phasicity and response to augmentation. Saphenofemoral Junction: No  evidence of thrombus. Normal compressibility and flow on color Doppler imaging. Profunda Femoral Vein: No evidence of thrombus. Normal compressibility and flow on color Doppler imaging. Femoral Vein: No evidence of thrombus. Normal compressibility, respiratory phasicity and response to augmentation. Popliteal Vein: No evidence of thrombus. Normal compressibility, respiratory phasicity and response to augmentation. Calf Veins: No evidence of thrombus. Normal compressibility and flow on color Doppler imaging. Superficial Great Saphenous Vein: No evidence of thrombus. Normal compressibility. Venous Reflux:  None. Other Findings: Note is made of an approximately 3.9 x 1.4 x 3.1 cm fluid collection with the left popliteal fossa compatible with a Baker's cyst. IMPRESSION: 1. No evidence of DVT within either lower extremity. 2. Incidental note made of approximately 3.9 cm left-sided Baker's cyst. Electronically Signed   By: Simonne Come M.D.   On: 03/31/2020 13:53   DG CHEST PORT 1 VIEW  Result Date: 03/27/2020 CLINICAL DATA:  Acute respiratory failure, COVID-19 positivity EXAM: PORTABLE CHEST 1 VIEW COMPARISON:  03/03/2020 FINDINGS: Cardiac shadow is stable. Mild airspace opacities are noted bilaterally consistent with the given clinical history but improved when compared with the prior exam. No sizable effusion is seen. No bony abnormality is noted. IMPRESSION: Improved bilateral infiltrates consistent with the given clinical history. Electronically Signed   By: Alcide Clever M.D.   On: 03/27/2020 20:53   DG Chest Portable 1 View  Result Date: 03/16/2020 CLINICAL DATA:  Weakness, shortness of breath and cough. EXAM: PORTABLE CHEST 1 VIEW COMPARISON:  March 09, 2018 FINDINGS: Moderate severity bilateral multifocal infiltrates are seen. There is no evidence of a pleural effusion or pneumothorax. The heart size and mediastinal contours are within normal limits. The  visualized skeletal structures are unremarkable.  IMPRESSION: Moderate severity bilateral multifocal infiltrates. Electronically Signed   By: Aram Candela M.D.   On: 03/22/2020 18:33   [place] EKG SITE RITE  Result Date: 03/23/2020 If Site Rite image not attached, placement could not be confirmed due to current cardiac rhythm.   Microbiology No results found for this or any previous visit (from the past 240 hour(s)).  Lab Basic Metabolic Panel: Recent Labs  Lab 03/29/20 2128 03/30/20 0459 04/03/20 0354  NA 129* 132*  --   K 4.7 4.3  --   CL 93* 96*  --   CO2 25 27  --   GLUCOSE 420* 266*  --   BUN 30* 24*  --   CREATININE 0.91 0.75 0.81  CALCIUM 7.9* 8.0*  --    Liver Function Tests: Recent Labs  Lab 03/30/20 0459  AST 44*  ALT 44  ALKPHOS 191*  BILITOT 0.5  PROT 6.4*  ALBUMIN 2.3*   No results for input(s): LIPASE, AMYLASE in the last 168 hours. No results for input(s): AMMONIA in the last 168 hours. CBC: No results for input(s): WBC, NEUTROABS, HGB, HCT, MCV, PLT in the last 168 hours. Cardiac Enzymes: No results for input(s): CKTOTAL, CKMB, CKMBINDEX, TROPONINI in the last 168 hours. Sepsis Labs: No results for input(s): PROCALCITON, WBC, LATICACIDVEN in the last 168 hours.  Procedures/Operations   Challis Crill 04/15/2020, 8:23 AM

## 2020-04-23 NOTE — Progress Notes (Signed)
Patient Demographics:    [name], is a 64 y.o. female, DOB - Nov 26, 1955, [id number]  Admit date - 2020/03/29   Admitting Physician Frankey Shown, DO  Outpatient Primary MD for the patient is Pearson Grippe, MD  LOS - 15  No chief complaint on file.       Subjective:    [name] is having more respiratory distress this morning and pulling off bipap on multiple attempts.  She is having air hunger and appears to be having much more respiratory distress with each breath.  She still wants bipap removed and continues to try to remove it. She had complained of pain to the nurse all over body and nurse informed me and we tried IV morphine but that did not give much relief.  Palliative tried IV dilaudid with good results.  Pt much more comfortable.     Assessment  & Plan :    Principal Problem:   Pneumonia due to COVID-19 virus Active Problems:   Acute respiratory failure with hypoxia (HCC)   Severe sepsis (HCC)   Leukopenia   Thrombocytopenia (HCC)   Hyperglycemia due to diabetes mellitus (HCC)   Essential hypertension   Type 2 diabetes mellitus with hyperlipidemia (HCC)   Obesity (BMI 30-39.9)   Tobacco abuse   Lactic acidosis   Acute hypoxemic respiratory failure due to COVID-19 [facility])   Goals of care, counseling/discussion   Palliative care by specialist   Counseling regarding end of life decision making  Brief Summary:-  64 y.o. female with medical history significant for hypertension, type 2 DM, hyperlipidemia, obesity and tobacco abuse admitted on 2020/03/29 with acute hypoxic respiratory failure secondary to Covid Pneumonia -Patient is Not vaccinated against COVID-19 infection --Unfortunately patient continues to decline despite maximal therapies.  Pt is not having any meaningful recovery.  I spoke with palliative medicine team and we have discussed with sons that patient is dying and  that comfort measures are appropriate.  Son present and is agreeable to IV dilaudid and infusion if necessary for comfort.   -Pt's son who lives in [place] he is driving here and plans to be here on 04/13/2020 but several hours away.   -Patient son who [place] is awaiting arrival of his brother from [place] prior to making a decision regarding possible transition to full comfort care.  --Anticipate in-hospital death  A/p 1)Acute hypoxic respiratory failure secondary to COVID-19 infection/Pneumonia--- The treatment plan and use of medications  for treatment of COVID-19 infection and possible side effects were discussed with patient/family -----Patient/Family verbalizes understanding and agrees to treatment protocols -Hypoxia and dyspnea continue to worsen  Focusing on comfort measures --Zinc and vitamin C as ordered -Albuterol inhaler as needed -Accu-Cheks/fingersticks while on high-dose steroids -PPI while on high-dose steroids -Enhanced dosage of anticoagulant for DVT prophylaxis given hypercoagulable state with COVID-19 infections -Continue IV Steroids started on 2020/03/29  -She has completed a course of remdesivir -Given persistent severe hypoxia and inflammatory markers-baricitinib started on 03/21/2020 (completed 14 days of therapy) -Repeat chest x-ray from 03/27/2020 consistent with Covid pneumonia which appears to have improved radiologically -Patient was too unstable to go to CT scanner for CTA chest at this time due to persistent hypoxemia -Lower extremity venous Dopplers negative  for DVT  COVID-19 Labs  Recent Labs    04/02/20 0452 04/02/20 0453 04/03/20 0354 04/15/2020 0404  DDIMER  --  5.87* 6.83* 5.01*  FERRITIN 1,706*  --  1,620* 2,254*  CRP 21.0*  --  34.1* 20.3*    Lab Results  Component Value Date   SARSCOV2NAA POSITIVE (A) 03/03/2020   SARSCOV2NAA NEGATIVE 02/15/2019   2) COPD/tobacco abuse---  steroids and bronchodilators as above  #1  3)HLD-stopped Lipitor for now, watch LFTs closely with baricitinib use  4)HTN--developed tachycardia-- on IV metoprolol as needed for rate control  5)Leukopenia and Thrombocytopenia--- resolved on CBC from 03/22/20  6)Class II Obesity-- this increases overall morbidity and mortality -Body mass index is 33.59 kg/m.  7)Hyperglycemia/hypoglycemia--- patient had episodes of hypoglycemia due to lack of oral intake in the setting of prolonged BiPAP use, insulin therapy has been discontinued, -IV dextrose started, blood sugars are now trending up we will taper down dextrose solution - 8)Social/Ethics----- RN Ms Welton FlakesHala Crabtree at bedside--- we had extensive conversation with patient regarding goals of care and advanced directives -Patient repeatedly states myself and the RN at bedside that she wants to be a DNR/DNI----she understands that given her worsening respiratory status she may die- -palliative care consult noted --I called and updated patient's son (Mr. Jacqulyn BathDennis Favela) at [phone] on patient's condition and patient's decision for DNR/DNI status -04/07/2020 Pt's condition is rapidly deteriorating and she refusing to wear the bipap and has removed it several times.  After speaking with palliative medicine team and son focusing on treating her air hunger symptoms and uncontrolled pain. Pt appears terminal and anticipating hospital death.   Status is: Inpatient  Remains inpatient appropriate because:severe hypoxic respiratory failure in the setting of COVID-19 pneumonia requiring  IV steroids and supplemental oxygen  Disposition: The patient is from: Home              Anticipated d/c is to: Home              Anticipated d/c date is: 1 day              Patient currently is not medically stable to d/c. Barriers: Not Clinically Stable- severe hypoxic respiratory failure in the setting of COVID-19 pneumonia requiring  IV steroids and supplemental oxygen --Anticipate transition to comfort  care and in-hospital death  Code Status :  DNR  Family Communication:   Discussed with pt's son Mr. Jacqulyn BathDennis Belkin at [phone] --  Consults  : Palliative care DVT Prophylaxis  :  Lovenox -  - SCDs    Lab Results  Component Value Date   PLT 186 03/25/2020    Inpatient Medications  Scheduled Meds: . albuterol  2 puff Inhalation TID  . Chlorhexidine Gluconate Cloth  6 each Topical Daily  . enoxaparin (LOVENOX) injection  50 mg Subcutaneous Q24H  . feeding supplement (GLUCERNA SHAKE)  237 mL Oral BID BM  . insulin aspart  0-9 Units Subcutaneous Q4H  . mouth rinse  15 mL Mouth Rinse BID  . methylPREDNISolone (SOLU-MEDROL) injection  40 mg Intravenous Q12H  . nicotine  21 mg Transdermal Daily  . sodium chloride flush  10-40 mL Intracatheter Q12H   Continuous Infusions: . famotidine (PEPCID) IV 20 mg (04/03/20 2041)   PRN Meds:.acetaminophen, albuterol, chlorpheniramine-HYDROcodone, guaiFENesin-dextromethorphan, HYDROmorphone (DILAUDID) injection, iohexol, labetalol, LORazepam, metoprolol tartrate, morphine injection, ondansetron **OR** ondansetron (ZOFRAN) IV, phenol, sodium chloride, sodium chloride flush    Anti-infectives (From admission, onward)   Start     Dose/Rate  Route Frequency Ordered Stop   03/21/20 1000  remdesivir 100 mg in sodium chloride 0.9 % 100 mL IVPB  Status:  Discontinued       "Followed by" Linked Group Details   100 mg 200 mL/hr over 30 Minutes Intravenous Daily 03/08/2020 2013 03/17/2020 2020   03/21/20 1000  remdesivir 100 mg in sodium chloride 0.9 % 100 mL IVPB        100 mg 200 mL/hr over 30 Minutes Intravenous Daily 03/11/2020 2025 03/24/20 0857   02/27/2020 2030  remdesivir 100 mg in sodium chloride 0.9 % 100 mL IVPB  Status:  Discontinued       "And" Linked Group Details   100 mg 200 mL/hr over 30 Minutes Intravenous Daily 03/03/2020 2016 03/06/2020 2019   03/19/2020 2030  remdesivir 100 mg in sodium chloride 0.9 % 100 mL IVPB  Status:  Discontinued        "And" Linked Group Details   100 mg 200 mL/hr over 30 Minutes Intravenous  Once 02/26/2020 2016 03/17/2020 2019   03/03/2020 2030  remdesivir 100 mg in sodium chloride 0.9 % 100 mL IVPB  Status:  Discontinued        100 mg 200 mL/hr over 30 Minutes Intravenous Daily 03/09/2020 2017 03/12/2020 2019   02/29/2020 2030  remdesivir 100 mg in sodium chloride 0.9 % 100 mL IVPB  Status:  Discontinued        100 mg 200 mL/hr over 30 Minutes Intravenous  Once 02/26/2020 2021 02/22/2020 2025   03/11/2020 2030  remdesivir 100 mg in sodium chloride 0.9 % 100 mL IVPB  Status:  Discontinued       "And" Linked Group Details   100 mg 200 mL/hr over 30 Minutes Intravenous Daily 03/09/2020 2023 03/21/20 1116   03/06/2020 2030  remdesivir 100 mg in sodium chloride 0.9 % 100 mL IVPB       "And" Linked Group Details   100 mg 200 mL/hr over 30 Minutes Intravenous  Once 03/14/2020 2023 02/24/2020 2225   02/28/2020 2013  remdesivir 200 mg in sodium chloride 0.9% 250 mL IVPB  Status:  Discontinued       "Followed by" Linked Group Details   200 mg 580 mL/hr over 30 Minutes Intravenous Once 02/25/2020 2013 03/15/2020 2020        Objective:   Vitals:   04/24/20 0600 2020-04-24 0741 24-Apr-2020 0842 Apr 24, 2020 1124  BP: 115/88     Pulse: (!) 102 93  (!) 104  Resp: (!) 32 (!) 39  (!) 51  Temp:  100.2 F (37.9 C)  (!) 100.7 F (38.2 C)  TempSrc:  Axillary  Axillary  SpO2: (!) 83% 90% 91% (!) 78%  Weight:      Height:        Wt Readings from Last 3 Encounters:  04/24/2020 83.3 kg  09/14/19 94.1 kg  05/05/19 91.6 kg     Intake/Output Summary (Last 24 hours) at 04-24-20 1316 Last data filed at 04/03/2020 1630 Gross per 24 hour  Intake 568.74 ml  Output 500 ml  Net 68.74 ml    Physical Exam General exam: Pt having much more respiratory distress trying to pull off bipap on multiple attempts.   Respiratory system: poor air movement bilateral. Shallow breathing seen.  Tachypnea.  cardiovascular system: tachycardic.  No murmurs, rubs,  gallops. Gastrointestinal system: Abdomen is soft, NT, ND, +BS   Central nervous system: Generalized weakness, no focal neurological deficits. Extremities: No C/C/E, +pedal pulses Skin: No rashes,  lesions or ulcers   Data Review:   Micro Results No results found for this or any previous visit (from the past 240 hour(s)).  Radiology Reports US Venous Img Lower Bilateral (DVT)  Result Date: 03/31/2020 CLINICAL DATA:  History of COVID-19 infection now with hypoxemia. Evaluate for DVT. EXAM: BILATERAL LOWER EXTREMITY VENOUS DOPPLER ULTRASOUND TECHNIQUE: Gray-scale sonography with graded compression, as well as color Doppler and duplex ultrasound were performed to evaluate the lower extremity deep venous systems from the level of the common femoral vein and including the common femoral, femoral, profunda femoral, popliteal and calf veins including the posterior tibial, peroneal and gastrocnemius veins when visible. The superficial great saphenous vein was also interrogated. Spectral Doppler was utilized to evaluate flow at rest and with distal augmentation maneuvers in the common femoral, femoral and popliteal veins. COMPARISON:  None. FINDINGS: RIGHT LOWER EXTREMITY Common Femoral Vein: No evidence of thrombus. Normal compressibility, respiratory phasicity and response to augmentation. Saphenofemoral Junction: No evidence of thrombus. Normal compressibility and flow on color Doppler imaging. Profunda Femoral Vein: No evidence of thrombus. Normal compressibility and flow on color Doppler imaging. Femoral Vein: No evidence of thrombus. Normal compressibility, respiratory phasicity and response to augmentation. Popliteal Vein: No evidence of thrombus. Normal compressibility, respiratory phasicity and response to augmentation. Calf Veins: No evidence of thrombus. Normal compressibility and flow on color Doppler imaging. Superficial Great Saphenous Vein: No evidence of thrombus. Normal compressibility. Venous  Reflux:  None. Other Findings:  None. LEFT LOWER EXTREMITY Common Femoral Vein: No evidence of thrombus. Normal compressibility, respiratory phasicity and response to augmentation. Saphenofemoral Junction: No evidence of thrombus. Normal compressibility and flow on color Doppler imaging. Profunda Femoral Vein: No evidence of thrombus. Normal compressibility and flow on color Doppler imaging. Femoral Vein: No evidence of thrombus. Normal compressibility, respiratory phasicity and response to augmentation. Popliteal Vein: No evidence of thrombus. Normal compressibility, respiratory phasicity and response to augmentation. Calf Veins: No evidence of thrombus. Normal compressibility and flow on color Doppler imaging. Superficial Great Saphenous Vein: No evidence of thrombus. Normal compressibility. Venous Reflux:  None. Other Findings: Note is made of an approximately 3.9 x 1.4 x 3.1 cm fluid collection with the left popliteal fossa compatible with a Baker's cyst. IMPRESSION: 1. No evidence of DVT within either lower extremity. 2. Incidental note made of approximately 3.9 cm left-sided Baker's cyst. Electronically Signed   By: Simonne Come M.D.   On: 03/31/2020 13:53   DG CHEST PORT 1 VIEW  Result Date: 03/27/2020 CLINICAL DATA:  Acute respiratory failure, COVID-19 positivity EXAM: PORTABLE CHEST 1 VIEW COMPARISON:  Mar 21, 2020 FINDINGS: Cardiac shadow is stable. Mild airspace opacities are noted bilaterally consistent with the given clinical history but improved when compared with the prior exam. No sizable effusion is seen. No bony abnormality is noted. IMPRESSION: Improved bilateral infiltrates consistent with the given clinical history. Electronically Signed   By: Alcide Clever M.D.   On: 03/27/2020 20:53   DG Chest Portable 1 View  Result Date: March 21, 2020 CLINICAL DATA:  Weakness, shortness of breath and cough. EXAM: PORTABLE CHEST 1 VIEW COMPARISON:  March 09, 2018 FINDINGS: Moderate severity bilateral  multifocal infiltrates are seen. There is no evidence of a pleural effusion or pneumothorax. The heart size and mediastinal contours are within normal limits. The visualized skeletal structures are unremarkable. IMPRESSION: Moderate severity bilateral multifocal infiltrates. Electronically Signed   By: Aram Candela M.D.   On: March 21, 2020 18:33   [place] EKG SITE RITE  Result Date: 03/23/2020 If  Site Rite image not attached, placement could not be confirmed due to current cardiac rhythm.    CBC No results for input(s): WBC, HGB, HCT, PLT, MCV, MCH, MCHC, RDW, LYMPHSABS, MONOABS, EOSABS, BASOSABS, BANDABS in the last 168 hours.  Invalid input(s): NEUTRABS, BANDSABD  Chemistries  Recent Labs  Lab 03/29/20 0506 03/29/20 2128 03/30/20 0459 04/03/20 0354  NA 131* 129* 132*  --   K 4.3 4.7 4.3  --   CL 93* 93* 96*  --   CO2 26 25 27   --   GLUCOSE 208* 420* 266*  --   BUN 29* 30* 24*  --   CREATININE 0.88 0.91 0.75 0.81  CALCIUM 8.3* 7.9* 8.0*  --   AST 46*  --  44*  --   ALT 41  --  44  --   ALKPHOS 193*  --  191*  --   BILITOT 0.6  --  0.5  --     No results for input(s): CHOL, HDL, LDLCALC, TRIG, CHOLHDL, LDLDIRECT in the last 72 hours.  Lab Results  Component Value Date   HGBA1C 10.3 (H) Apr 14, 2020   ------------------------------------------------------------------------------------------------------------------ No results for input(s): TSH, T4TOTAL, T3FREE, THYROIDAB in the last 72 hours.  Invalid input(s): FREET3 ------------------------------------------------------------------------------------------------------------------ Recent Labs    04/03/20 0354 04/14/2020 0404  FERRITIN 1,620* 2,254*    Coagulation profile No results for input(s): INR, PROTIME in the last 168 hours.  Recent Labs    04/03/20 0354 03/29/2020 0404  DDIMER 6.83* 5.01*   Cardiac Enzymes No results for input(s): CKMB, TROPONINI, MYOGLOBIN in the last 168 hours.  Invalid input(s):  CK ------------------------------------------------------------------------------------------------------------------ No results found for: BNP  04/06/20 M.D on 04/22/2020 at 1:16 PM    Critical Care Procedure Note Authorized and Performed by: 04/06/2020 MD  Total Critical Care time:  50 mins  Due to a high probability of clinically significant, life threatening deterioration, the patient required my highest level of preparedness to intervene emergently and I personally spent this critical care time directly and personally managing the patient.  This critical care time included obtaining a history; examining the patient, pulse oximetry; ordering and review of studies; arranging urgent treatment with development of a management plan; evaluation of patient's response of treatment; frequent reassessment; and discussions with other providers.  This critical care time was performed to assess and manage the high probability of imminent and life threatening deterioration that could result in multi-organ failure.  It was exclusive of separately billable procedures and treating other patients and teaching time.    Go to [email or web address] - for contact info  [facility] - Office  [phone]

## 2020-04-23 DEATH — deceased

## 2024-05-27 ENCOUNTER — Encounter (HOSPITAL_BASED_OUTPATIENT_CLINIC_OR_DEPARTMENT_OTHER): Payer: Self-pay | Admitting: Surgery
